# Patient Record
Sex: Male | Born: 1935
Health system: Southern US, Community
[De-identification: ages and names within clinical notes are randomized; demographics above are authoritative.]

## PROBLEM LIST (undated history)

## (undated) DIAGNOSIS — Z860100 Personal history of colon polyps, unspecified: Secondary | ICD-10-CM

## (undated) DIAGNOSIS — M48061 Spinal stenosis, lumbar region without neurogenic claudication: Secondary | ICD-10-CM

## (undated) DIAGNOSIS — L570 Actinic keratosis: Secondary | ICD-10-CM

## (undated) DIAGNOSIS — E785 Hyperlipidemia, unspecified: Secondary | ICD-10-CM

## (undated) DIAGNOSIS — K5730 Diverticulosis of large intestine without perforation or abscess without bleeding: Secondary | ICD-10-CM

## (undated) DIAGNOSIS — I872 Venous insufficiency (chronic) (peripheral): Secondary | ICD-10-CM

## (undated) DIAGNOSIS — F419 Anxiety disorder, unspecified: Secondary | ICD-10-CM

## (undated) DIAGNOSIS — D689 Coagulation defect, unspecified: Secondary | ICD-10-CM

## (undated) DIAGNOSIS — I2699 Other pulmonary embolism without acute cor pulmonale: Principal | ICD-10-CM

## (undated) DIAGNOSIS — H269 Unspecified cataract: Secondary | ICD-10-CM

## (undated) DIAGNOSIS — N183 Chronic kidney disease, stage 3 unspecified: Secondary | ICD-10-CM

## (undated) DIAGNOSIS — I82409 Acute embolism and thrombosis of unspecified deep veins of unspecified lower extremity: Secondary | ICD-10-CM

## (undated) DIAGNOSIS — H3530 Unspecified macular degeneration: Secondary | ICD-10-CM

## (undated) DIAGNOSIS — Z8546 Personal history of malignant neoplasm of prostate: Secondary | ICD-10-CM

## (undated) DIAGNOSIS — M1990 Unspecified osteoarthritis, unspecified site: Secondary | ICD-10-CM

## (undated) DIAGNOSIS — I493 Ventricular premature depolarization: Secondary | ICD-10-CM

## (undated) DIAGNOSIS — C801 Malignant (primary) neoplasm, unspecified: Secondary | ICD-10-CM

## (undated) HISTORY — DX: Venous insufficiency (chronic) (peripheral): I87.2

## (undated) HISTORY — DX: Hyperlipidemia, unspecified: E78.5

## (undated) HISTORY — DX: Personal history of malignant neoplasm of prostate: Z85.46

## (undated) HISTORY — DX: Anxiety disorder, unspecified: F41.9

## (undated) HISTORY — DX: Unspecified osteoarthritis, unspecified site: M19.90

## (undated) HISTORY — DX: Chronic kidney disease, stage 3 (moderate): N18.3

## (undated) HISTORY — PX: VARICOSE VEIN SURGERY: SHX832

## (undated) HISTORY — DX: Coagulation defect, unspecified: D68.9

## (undated) HISTORY — PX: APPENDECTOMY: SHX54

## (undated) HISTORY — DX: Unspecified macular degeneration: H35.30

## (undated) HISTORY — DX: Chronic kidney disease, stage 3 unspecified: N18.30

## (undated) HISTORY — DX: Personal history of colon polyps, unspecified: Z86.0100

## (undated) HISTORY — DX: Personal history of colonic polyps: Z86.010

## (undated) HISTORY — DX: Spinal stenosis, lumbar region without neurogenic claudication: M48.061

## (undated) HISTORY — DX: Diverticulosis of large intestine without perforation or abscess without bleeding: K57.30

## (undated) HISTORY — DX: Unspecified cataract: H26.9

## (undated) HISTORY — DX: Actinic keratosis: L57.0

## (undated) HISTORY — PX: TONSILLECTOMY: SUR1361

## (undated) HISTORY — DX: Other pulmonary embolism without acute cor pulmonale: I26.99

---
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
## 2006-02-21 ENCOUNTER — Encounter: Payer: Self-pay | Admitting: Internal Medicine

## 2008-12-03 DIAGNOSIS — C801 Malignant (primary) neoplasm, unspecified: Secondary | ICD-10-CM

## 2008-12-03 HISTORY — PX: PROSTATECTOMY: SHX69

## 2008-12-03 HISTORY — DX: Malignant (primary) neoplasm, unspecified: C80.1

## 2009-12-03 HISTORY — PX: POLYPECTOMY: SHX149

## 2009-12-03 HISTORY — PX: COLONOSCOPY: SHX174

## 2010-10-24 ENCOUNTER — Ambulatory Visit: Payer: Self-pay | Admitting: Internal Medicine

## 2010-10-24 DIAGNOSIS — Z860100 Personal history of colon polyps, unspecified: Secondary | ICD-10-CM | POA: Insufficient documentation

## 2010-10-24 DIAGNOSIS — E785 Hyperlipidemia, unspecified: Secondary | ICD-10-CM

## 2010-10-24 DIAGNOSIS — Z8546 Personal history of malignant neoplasm of prostate: Secondary | ICD-10-CM

## 2010-10-24 DIAGNOSIS — K5730 Diverticulosis of large intestine without perforation or abscess without bleeding: Secondary | ICD-10-CM | POA: Insufficient documentation

## 2010-12-03 DIAGNOSIS — I82409 Acute embolism and thrombosis of unspecified deep veins of unspecified lower extremity: Secondary | ICD-10-CM

## 2010-12-03 HISTORY — DX: Acute embolism and thrombosis of unspecified deep veins of unspecified lower extremity: I82.409

## 2010-12-25 ENCOUNTER — Telehealth: Payer: Self-pay | Admitting: Internal Medicine

## 2011-01-02 NOTE — Assessment & Plan Note (Signed)
Summary: NEW PT TO [facility]   Vital Signs:  Patient profile:   77 year old male Height:      68 inches Weight:      178 pounds BMI:     27.16 Temp:     98.2 degrees F oral Pulse rate:   60 / minute Pulse rhythm:   regular BP sitting:   112 / 60  (left arm) Cuff size:   large  Vitals Entered By: Mervin Hack CMA Duncan Dull) (October 24, 2010 11:20 AM) CC: new patient to establish care   History of Present Illness: Moved to [facility] [facility] this month Transferring care here  Has history of high cholesterol On meds for many years Did have brief leg pain---no problems since switch to this med  Glaucoma and macular degeneration Basically blind in right eye still able to drive Established with Dr Inez Pilgrim and will see their retinal specialist  Has history of skin cancer--no melanoma  Prostate cancer---had robotic prostatectomy Did see urologist in September PSA undetectable then  history of colonic polyps recall 3 years  Had hernia repair some years ago with mesh Now noting some left groin pain worse yesterday with lifting something  Diagnosed with spinal stenosis did get injections which helped some Has to be careful walking down steps ---has sense of being ready to fall Does okay regular walking up steps or on level ground Helps to take ibuprofen--like before playing golf  Preventive Screening-Counseling & Management  Alcohol-Tobacco     Smoking Status: quit  Allergies (verified): No Known Drug Allergies  Past History:  Past Medical History: Prostate cancer, hx of Colonic polyps, hx of Diverticulosis, colon Hyperlipidemia Glaucoma Macular degeneration--legally blind in right eye Lumbar spinal stenosis Osteoarthritis  Past Surgical History: Appendectomy Inguinal herniorrhaphy--left Prostatectomy Tonsillectomy Varicose veins stripping --left   Family History: Dad died @99   Mild stroke when young Mom died @84  Dementia 1 sister  died of leukemia @71  NO CAD, DM, HTN No colon or prostate cancer  Social History: 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 WIfe is his health care power of attorney. Discussed DNR--he requests this  No feeding tube Smoking Status:  quit  Review of Systems General:  weight has been stable sleeps fairly well wears seat belt. Eyes:  Complains of vision loss-1 eye; denies double vision. ENT:  Complains of decreased hearing; denies ringing in ears; ?mild hearing loss Own teeth with crowns---regular with dentist. CV:  Denies chest pain or discomfort, difficulty breathing at night, difficulty breathing while lying down, fainting, lightheadness, palpitations, and shortness of breath with exertion. Resp:  Denies cough and shortness of breath. GI:  Denies abdominal pain, bloody stools, change in bowel habits, dark tarry stools, indigestion, nausea, and vomiting. GU:  Complains of erectile dysfunction; denies incontinence, urinary frequency, and urinary hesitancy; no sexual function since surgery. MS:  Complains of joint pain and low back pain; denies joint swelling; scattered joint pains only occ back pain. Derm:  Denies lesion(s) and rash. Neuro:  Complains of headaches; denies numbness, tingling, and weakness; rare headaches. Psych:  Denies anxiety and depression. Heme:  Denies abnormal bruising and enlarge lymph nodes. Allergy:  Denies seasonal allergies and sneezing; rarely tries some claritin if congested.  Physical Exam  General:  alert and normal appearance.   Eyes:  pupils equal, pupils round, and pupils reactive to light.   Mouth:  no erythema, no exudates, and no  lesions.   Neck:  supple, no masses, no thyromegaly, no carotid bruits, and no cervical lymphadenopathy.   Lungs:  normal respiratory effort, no intercostal retractions, no accessory muscle use, and normal breath  sounds.   Heart:  normal rate, regular rhythm, no murmur, and no gallop.   Abdomen:  soft and non-tender.   Small weakness along left inguinal ring Msk:  no joint tenderness and no joint swelling.   Pulses:  1+ in feet Extremities:  no edema Neurologic:  alert & oriented X3, strength normal in all extremities, and gait normal.   Skin:  no rashes and no suspicious lesions.   Axillary Nodes:  No palpable lymphadenopathy Psych:  normally interactive, good eye contact, not anxious appearing, and not depressed appearing.     Impression & Recommendations:  Problem # 1:  SPINAL STENOSIS, LUMBAR (ICD-724.02) Assessment Comment Only probably leads to his balance issues pain has been fine since the shots  Problem # 2:  HYPERLIPIDEMIA (ICD-272.4) Assessment: Comment Only discussed my feelings about primary prevention he will continue for now  His updated medication list for this problem includes:    Pravastatin Sodium 20 Mg Tabs (Pravastatin sodium) .Marland Kitchen... Take 1 by mouth once daily  Problem # 3:  PROSTATE CANCER, HX OF (ICD-V10.46) Assessment: Comment Only We will check PSAs here--starting next visit  Problem # 4:  OSTEOARTHRITIS (ICD-715.90) Assessment: Comment Only does okay with occ ibuprofen  Complete Medication List: 1)  Pravastatin Sodium 20 Mg Tabs (Pravastatin sodium) .... Take 1 by mouth once daily 2)  Alphagan P 0.1 % Soln (Brimonidine tartrate) .... 2 drops daily 3)  Fish Oil 1000 Mg Caps (Omega-3 fatty acids) .... Once daily 4)  Aspirin 81 Mg Tabs (Aspirin) .... Take 1 by mouth once daily 5)  Vision Vitamins Tabs (Multiple vitamins-minerals) .... Once daily 6)  Acular Ls 0.4 % Soln (Ketorolac tromethamine) .... Once daily  Other Orders: TD Toxoids IM 7 YR + (82956) Admin 1st Vaccine (21308)  Patient Instructions: 1)  Please schedule a follow-up appointment in 6 months .    Orders Added: 1)  New Patient Level IV [99204] 2)  TD Toxoids IM 7 YR + [90714] 3)  Admin  1st Vaccine [90471]   Immunization History:  Influenza Immunization History:    Influenza:  historical (10/03/2010)  Pneumovax Immunization History:    Pneumovax:  historical (10/30/2005)  Zostavax History:    Zostavax # 1:  zostavax (10/03/2010)  Immunizations Administered:  Tetanus Vaccine:    Vaccine Type: Td    Site: left deltoid    Mfr: Sanofi Pasteur    Dose: 0.5 ml    Route: IM    Given by: Mervin Hack CMA (AAMA)    Exp. Date: 01/04/2012    Lot #: [id number]    VIS given: 10/20/08 version given October 24, 2010.   Immunization History:  Influenza Immunization History:    Influenza:  Historical (10/03/2010)  Pneumovax Immunization History:    Pneumovax:  Historical (10/30/2005)  Zostavax History:    Zostavax # 1:  Zostavax (10/03/2010)  Immunizations Administered:  Tetanus Vaccine:    Vaccine Type: Td    Site: left deltoid    Mfr: Sanofi Pasteur    Dose: 0.5 ml    Route: IM    Given by: Mervin Hack CMA (AAMA)    Exp. Date: 01/04/2012    Lot #: [id number]    VIS given: 10/20/08 version given October 24, 2010.  Current Allergies (reviewed today): No known allergies  Colonoscopy  Procedure date:  06/30/2010  Findings:      recurrent adenomatous polyps 3 year recall

## 2011-01-02 NOTE — Letter (Signed)
[facility]  [facility] Records   Imported By: Beau Fanny 10/24/2010 14:28:43  _____________________________________________________________________  External Attachment:    Type:   Image     Comment:   External Document

## 2011-01-02 NOTE — Letter (Signed)
Summary: Nature conservation officer [employer] Wellness Visit Questionnaire   [employer] Annual Wellness Visit Questionnaire   Imported By: Beau Fanny 10/24/2010 14:41:02  _____________________________________________________________________  External Attachment:    Type:   Image     Comment:   External Document

## 2011-01-02 NOTE — Miscellaneous (Signed)
Summary: Do Not Resuscitate Order  Do Not Resuscitate Order   Imported By: Beau Fanny 10/24/2010 14:40:11  _____________________________________________________________________  External Attachment:    Type:   Image     Comment:   External Document

## 2011-01-04 NOTE — Progress Notes (Signed)
Summary: pravastatin  Phone Note Refill Request Message from:  Patient on December 25, 2010 10:47 AM  Refills Requested: Medication #1:  PRAVASTATIN SODIUM 20 MG TABS take 1 by mouth once daily Patient needs written script for mail order.    Method Requested: Pick up at Office Initial call taken by: Melody Comas,  December 25, 2010 10:47 AM  Follow-up for Phone Call        Rx written Follow-up by: Cindee Salt MD,  December 25, 2010 1:37 PM  Additional Follow-up for Phone Call Additional follow up Details #1::        Spoke with patient and advised rx ready for pick-up  Additional Follow-up by: Mervin Hack CMA Duncan Dull),  December 25, 2010 2:21 PM    New/Updated Medications: PRAVASTATIN SODIUM 20 MG TABS (PRAVASTATIN SODIUM) take 1 by mouth once daily Prescriptions: PRAVASTATIN SODIUM 20 MG TABS (PRAVASTATIN SODIUM) take 1 by mouth once daily  #90 x 3   Entered and Authorized by:   Cindee Salt MD   Signed by:   Cindee Salt MD on 12/25/2010   Method used:   Print then Give to Patient   RxID:   [id number]

## 2011-02-12 ENCOUNTER — Encounter: Payer: Self-pay | Admitting: Internal Medicine

## 2011-02-12 DIAGNOSIS — M1990 Unspecified osteoarthritis, unspecified site: Secondary | ICD-10-CM

## 2011-02-12 DIAGNOSIS — H353220 Exudative age-related macular degeneration, left eye, stage unspecified: Secondary | ICD-10-CM | POA: Insufficient documentation

## 2011-02-12 DIAGNOSIS — H409 Unspecified glaucoma: Secondary | ICD-10-CM | POA: Insufficient documentation

## 2011-02-12 DIAGNOSIS — Z8546 Personal history of malignant neoplasm of prostate: Secondary | ICD-10-CM

## 2011-02-12 DIAGNOSIS — K5730 Diverticulosis of large intestine without perforation or abscess without bleeding: Secondary | ICD-10-CM

## 2011-02-12 DIAGNOSIS — Z860100 Personal history of colon polyps, unspecified: Secondary | ICD-10-CM

## 2011-02-12 DIAGNOSIS — E785 Hyperlipidemia, unspecified: Secondary | ICD-10-CM

## 2011-02-12 DIAGNOSIS — M48061 Spinal stenosis, lumbar region without neurogenic claudication: Secondary | ICD-10-CM

## 2011-02-12 DIAGNOSIS — H3530 Unspecified macular degeneration: Secondary | ICD-10-CM

## 2011-02-28 ENCOUNTER — Encounter: Payer: Self-pay | Admitting: Family Medicine

## 2011-02-28 ENCOUNTER — Non-Acute Institutional Stay: Payer: Medicare Other | Admitting: Family Medicine

## 2011-02-28 VITALS — HR 66 | Temp 98.7°F | Resp 18

## 2011-02-28 DIAGNOSIS — J069 Acute upper respiratory infection, unspecified: Secondary | ICD-10-CM

## 2011-02-28 MED ORDER — AMOXICILLIN 500 MG PO CAPS: 500.0000 mg | ORAL_CAPSULE | Freq: Two times a day (BID) | ORAL | Status: AC

## 2011-02-28 MED ORDER — AMOXICILLIN 500 MG PO CAPS: 500.0000 mg | ORAL_CAPSULE | Freq: Two times a day (BID) | ORAL | Status: DC

## 2011-02-28 NOTE — Progress Notes (Addendum)
  Subjective:    Patient ID: Juan Horn, male    DOB: [birth year], 77 y.o.   MRN: [id number]  HPI duration of symptoms: 2 weeks, getting progressively worse.  Just returned from 2 week cruise Rhinorrhea: yes congestion:yes ear pain: yes sore throat:yes Cough:productive of clear sputum myalgias:no other concerns: no CP, NO SOB.    Meds, vitals, and allergies reviewed.   Review of Systems  ROS: See HPI.  Otherwise negative.     Objective:   Physical Exam     GEN: nad, alert and oriented HEENT: mucous membranes moist, TM w/o erythema, nasal epithelium injected, OP with cobblestoning NECK: supple w/o LA CV: rrr. PULM: ctab, no inc wob ABD: soft, +bs EXT: no edema   Assessment & Plan:

## 2011-02-28 NOTE — Assessment & Plan Note (Signed)
Give duration and progression of symptoms along with recent travel, will treat for bacterial process with amoxicillin. See pt instructions for details.

## 2011-02-28 NOTE — Progress Notes (Signed)
Addended by: Ruthe Mannan on: 02/28/2011 01:40 PM   Modules accepted: Orders, Level of Service

## 2011-02-28 NOTE — Progress Notes (Signed)
Addended by: Ruthe Mannan on: 02/28/2011 03:00 PM   Modules accepted: Orders

## 2011-02-28 NOTE — Patient Instructions (Signed)
Take antibiotic as directed.  Drink lots of fluids.  Treat sympotmatically with Mucinex, nasal saline irrigation, and Tylenol/Ibuprofen. Also try claritin D or zyrtec D over the counter- two times a day as needed ( have to sign for them at pharmacy). You can use warm compresses.  Cough suppressant at night. Call if not improving as expected in 5-7 days.    

## 2011-03-01 ENCOUNTER — Telehealth: Payer: Self-pay | Admitting: *Deleted

## 2011-03-01 NOTE — Telephone Encounter (Signed)
I'm confused.  I sent in his amoxicillin yesterday.  I never said anything about a cough medicine.  Please verify what the pharmacy has received the amoxicillin rx. If he would like cough suppressant, ok to send in tessalon perles- 100 mg- 1 tab three times daily as needed for cough, number 30 with no refills.

## 2011-03-01 NOTE — Telephone Encounter (Signed)
Pt was seen yesterday at [facility] and was told that 2 meds would be called in for him to [facility]. The pharmacy also called and said they dont have a record of these meds being sent in and that the pt called them 5 times yesterday looking for his cough medicine.  Please send in.

## 2011-03-01 NOTE — Telephone Encounter (Signed)
See my note

## 2011-03-01 NOTE — Telephone Encounter (Signed)
Pharmacy stated that Rx was never sent to pharmacy.  Patient was upset because Rx was not at the pharmacy.  Called in Rx for Amoxicillin and Tessalon Perles.

## 2011-03-23 ENCOUNTER — Ambulatory Visit (INDEPENDENT_AMBULATORY_CARE_PROVIDER_SITE_OTHER): Payer: Medicare Other | Admitting: Internal Medicine

## 2011-03-23 ENCOUNTER — Encounter: Payer: Self-pay | Admitting: Internal Medicine

## 2011-03-23 VITALS — BP 112/60 | HR 54 | Temp 98.5°F | Ht 68.0 in | Wt 185.0 lb

## 2011-03-23 DIAGNOSIS — M1710 Unilateral primary osteoarthritis, unspecified knee: Secondary | ICD-10-CM

## 2011-03-23 DIAGNOSIS — Z8546 Personal history of malignant neoplasm of prostate: Secondary | ICD-10-CM

## 2011-03-23 DIAGNOSIS — M48061 Spinal stenosis, lumbar region without neurogenic claudication: Secondary | ICD-10-CM

## 2011-03-23 DIAGNOSIS — C61 Malignant neoplasm of prostate: Secondary | ICD-10-CM | POA: Insufficient documentation

## 2011-03-23 DIAGNOSIS — E785 Hyperlipidemia, unspecified: Secondary | ICD-10-CM

## 2011-03-23 MED ORDER — TRIAMCINOLONE ACETONIDE 40 MG/ML IJ SUSP: 40.0000 mg | Freq: Once | INTRAMUSCULAR | Status: DC

## 2011-03-23 NOTE — Progress Notes (Signed)
Subjective:    Patient ID: Juan Horn, male    DOB: Jul 14, 1933, 77 y.o.   MRN: [id number]  HPI Having knee and back pain Had been told his knees were bad even 6 years ago Then they concentrated on the spinal stenosis Did just have last shot there in March  Pain is mostly in left knee now Hard to walk--pain comes on just walking short distances Had to give up golf Ibuprofen 800mg  helps some Esp hard up or down hills Some tightness behind left knee but no sig swelling No sig right leg symptoms Can go up steps--holds the railing. Going down steps "is pure hell" This is the same pain  Cortisone injections in the distant past---doesn't remember if they helped  Still gets PSAs every 6 months No urinary problems  Still on chol med  Current outpatient prescriptions:aspirin 81 MG tablet, Take 81 mg by mouth daily.  , Disp: , Rfl: ;  fish oil-omega-3 fatty acids 1000 MG capsule, Take 1 g by mouth daily.  , Disp: , Rfl: ;  Multiple Vitamin (MULTIVITAMIN) tablet, Take 1 tablet by mouth daily.  , Disp: , Rfl: ;  pravastatin (PRAVACHOL) 20 MG tablet, Take 20 mg by mouth daily.  , Disp: , Rfl: ;  DISCONTD: brimonidine (ALPHAGAN P) 0.1 % SOLN, 2 drops daily.  , Disp: , Rfl:  DISCONTD: ketorolac (ACULAR) 0.4 % SOLN, 1 drop daily.  , Disp: , Rfl:   Past Medical History  Diagnosis Date  . Personal history of prostate cancer   . Hx of colonic polyp   . Diverticulosis of colon   . HLD (hyperlipidemia)   . Glaucoma   . Macular degeneration     legally blind in right eye  . Spinal stenosis of lumbar region   . OA (osteoarthritis)     Past Surgical History  Procedure Date  . Appendectomy   . Inguinal hernia repair     left  . Prostatectomy   . Tonsillectomy   . Varicose vein surgery     left    Family History  Problem Relation Age of Onset  . Stroke Father   . Dementia Mother   . Leukemia Sister     History   Social History  . Marital Status: Married    Spouse Name: N/A      Number of Children: 0  . Years of Education: N/A   Occupational History  . 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 Smoking status: Former Smoker    Quit date: 12/03/1978  . Smokeless tobacco: Not on file  . Alcohol Use: Yes     Wine-several times per week  . Drug Use: Not on file  . Sexually Active: Not on file   Other Topics Concern  . Not on file   Social History Narrative   Has living will DNR done 11/12Wife is health care POA.No feeding tube   Review of Systems Goes to fitness center and does some LE weights No leg weakness of note No change in bowel or bladder habits    Objective:   Physical Exam  Constitutional: He is oriented to person, place, and time. He appears well-developed and well-nourished. No distress.  Neck: Normal range of motion. Neck supple.  Musculoskeletal: He exhibits no edema and no tenderness.       Crepitus in left knee with ROM Macmurray's negative No ligament instability Passive ROM fairly normal  Neurological: He is alert and oriented  to person, place, and time. He exhibits normal muscle tone.       No focal weakness  Psychiatric: He has a normal mood and affect. His behavior is normal. Judgment and thought content normal.          Assessment & Plan:

## 2011-03-23 NOTE — Progress Notes (Signed)
Addended by: Melody Comas on: 03/23/2011 03:30 PM   Modules accepted: Orders

## 2011-03-23 NOTE — Patient Instructions (Signed)
Call for orthopedic referral if your knee isn't better soon

## 2011-03-24 LAB — PSA: PSA: <0.01 ng/mL (ref <=4.00)

## 2011-04-03 HISTORY — PX: INGUINAL HERNIA REPAIR: SUR1180

## 2011-04-06 ENCOUNTER — Ambulatory Visit (INDEPENDENT_AMBULATORY_CARE_PROVIDER_SITE_OTHER): Payer: Medicare Other | Admitting: Internal Medicine

## 2011-04-06 ENCOUNTER — Encounter: Payer: Self-pay | Admitting: Internal Medicine

## 2011-04-06 VITALS — BP 124/94 | HR 72 | Temp 98.7°F | Ht 67.0 in | Wt 180.4 lb

## 2011-04-06 DIAGNOSIS — K4090 Unilateral inguinal hernia, without obstruction or gangrene, not specified as recurrent: Secondary | ICD-10-CM

## 2011-04-06 NOTE — Patient Instructions (Signed)
Please set up the appointment with the surgeon

## 2011-04-06 NOTE — Progress Notes (Signed)
  Subjective:    Patient ID: Juan Horn, male    DOB: 01/14/1933, 77 y.o.   MRN: [id number]  HPI Shot in knee really helped pain  Has had intermittent stomach problems Notes swelling in left lower abdomen Particularly bad after golf 2 days ago Points to left inguinal area  No trouble with bowels No urinary trouble No scrotal swelling  Had [facility] laparascopically 10 years or more ago. Mesh used Prostate surgery ~2 years ago Marked trouble due to adhesions?, mesh apparently left in  Current outpatient prescriptions:aspirin 81 MG tablet, Take 81 mg by mouth daily.  , Disp: , Rfl: ;  fish oil-omega-3 fatty acids 1000 MG capsule, Take 1 g by mouth daily.  , Disp: , Rfl: ;  glucosamine-chondroitin 500-400 MG tablet, Take 1 tablet by mouth daily.  , Disp: , Rfl: ;  Multiple Vitamin (MULTIVITAMIN) tablet, Take 1 tablet by mouth daily.  , Disp: , Rfl: ;  pravastatin (PRAVACHOL) 20 MG tablet, Take 20 mg by mouth daily.  , Disp: , Rfl:  Current facility-administered medications:triamcinolone acetonide (KENALOG-40) injection 40 mg, 40 mg, Intra-articular, Once, Varney Baas, MD  Past Medical History  Diagnosis Date  . Personal history of prostate cancer   . Hx of colonic polyp   . Diverticulosis of colon   . HLD (hyperlipidemia)   . Glaucoma   . Macular degeneration     legally blind in right eye  . Spinal stenosis of lumbar region   . OA (osteoarthritis)     Past Surgical History  Procedure Date  . Appendectomy   . Inguinal hernia repair     left  . Prostatectomy   . Tonsillectomy   . Varicose vein surgery     left    Family History  Problem Relation Age of Onset  . Stroke Father   . Dementia Mother   . Leukemia Sister     History   Social History  . Marital Status: Married    Spouse Name: N/A    Number of Children: 0  . Years of Education: N/A   Occupational History  . 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 Smoking status:  Former Smoker    Quit date: 12/03/1978  . Smokeless tobacco: Not on file  . Alcohol Use: Yes     Wine-several times per week  . Drug Use: Not on file  . Sexually Active: Not on file   Other Topics Concern  . Not on file   Social History Narrative   Has living will DNR done 11/12Wife is health care POA.No feeding tube   Review of Systems No fever Doesn't feel sick     Objective:   Physical Exam  Constitutional: He appears well-developed and well-nourished. No distress.  Abdominal: Soft. He exhibits no distension. There is no tenderness. There is no guarding.  Genitourinary:       Scrotum quiet Does have palpable bowel in left groin Reduces when supine Not clearly down canal on exam          Assessment & Plan:

## 2011-04-10 ENCOUNTER — Ambulatory Visit: Payer: Self-pay | Admitting: Internal Medicine

## 2011-04-25 ENCOUNTER — Ambulatory Visit: Payer: Self-pay | Admitting: Anesthesiology

## 2011-04-26 ENCOUNTER — Ambulatory Visit: Payer: Self-pay | Admitting: General Surgery

## 2011-05-08 ENCOUNTER — Encounter: Payer: Self-pay | Admitting: Internal Medicine

## 2011-05-28 ENCOUNTER — Ambulatory Visit (INDEPENDENT_AMBULATORY_CARE_PROVIDER_SITE_OTHER): Payer: Medicare Other | Admitting: Internal Medicine

## 2011-05-28 ENCOUNTER — Encounter: Payer: Self-pay | Admitting: Internal Medicine

## 2011-05-28 VITALS — BP 120/60 | HR 64 | Temp 98.0°F | Ht 68.0 in | Wt 181.0 lb

## 2011-05-28 DIAGNOSIS — F419 Anxiety disorder, unspecified: Secondary | ICD-10-CM

## 2011-05-28 DIAGNOSIS — F411 Generalized anxiety disorder: Secondary | ICD-10-CM

## 2011-05-28 MED ORDER — CITALOPRAM HYDROBROMIDE 10 MG PO TABS: 10.0000 mg | ORAL_TABLET | Freq: Every day | ORAL | Status: DC

## 2011-05-28 MED ORDER — ALPRAZOLAM 0.25 MG PO TABS: 0.2500 mg | ORAL_TABLET | Freq: Three times a day (TID) | ORAL | Status: AC | PRN

## 2011-05-28 NOTE — Assessment & Plan Note (Signed)
History of significant issue 40 years ago and some mild anxiety at times Move here 7 months ago went very well but now in conflict with another resident and that has thrown him for a loop Daily anxiety symptoms for the past 3 weeks or so  Discussed at length counselled over half of 25 minute visit Will start citalopram at low dose Alprazolam for prn use

## 2011-05-28 NOTE — Progress Notes (Signed)
Subjective:    Patient ID: Juan Horn, male    DOB: 03/06/1933, 77 y.o.   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 help Clashes with another member in club---very negative (and the meetings were at patient's house) Sent e-mail "are we going to have a show down?"  "I feel like I am on his train and I have no control and can't get off" Has had anxiety issues back in 1970's Started new job at that time---corruption there and tough situation Had to leave work for a week then  Has been awakening stress out in AM and in middle of night No palpitations or chest pain Stomach feels tight  Wife notes it has been "one thing or another" Anxiety has been building for some time Symptoms have been persistent for some weeks--awakens stressed every morning  Allergies  Allergen Reactions  . Eye Drops Other (See Comments)    (Afgan) redness   Current Outpatient Prescriptions on File Prior to Visit  Medication Sig Dispense Refill  . aspirin 81 MG tablet Take 81 mg by mouth daily.        . fish oil-omega-3 fatty acids 1000 MG capsule Take 1 g by mouth daily.        Marland Kitchen glucosamine-chondroitin 500-400 MG tablet Take 1 tablet by mouth daily.        . Multiple Vitamin (MULTIVITAMIN) tablet Take 1 tablet by mouth daily.        . pravastatin (PRAVACHOL) 20 MG tablet Take 20 mg by mouth daily.         Current Facility-Administered Medications on File Prior to Visit  Medication Dose Route Frequency Provider Last Rate Last Dose  . DISCONTD: triamcinolone acetonide (KENALOG-40) injection 40 mg  40 mg Intra-articular Once Varney Baas, MD       Past Medical History  Diagnosis Date  . Personal history of prostate cancer   . Hx of colonic polyp   . Diverticulosis of colon   . HLD (hyperlipidemia)   . Glaucoma   . Macular degeneration     legally  blind in right eye  . Spinal stenosis of lumbar region   . OA (osteoarthritis)     Past Surgical History  Procedure Date  . Appendectomy   . Inguinal hernia repair     left  . Prostatectomy   . Tonsillectomy   . Varicose vein surgery     left  . Inguinal hernia repair 5/12    Dr Beverly Sessions  . Inguinal hernia repair 5/12    Dr Evette Cristal did redo of this    Family History  Problem Relation Age of Onset  . Stroke Father   . Dementia Mother   . Leukemia Sister     History   Social History  . Marital Status: Married    Spouse Name: N/A    Number of Children: 0  . Years of Education: N/A   Occupational History  . 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 Smoking status: Former Smoker    Quit date: 12/03/1978  . Smokeless tobacco: Not on file  . Alcohol Use: Yes     Wine-several times per week  . Drug Use: Not on file  . Sexually Active: Not on file   Other Topics Concern  . Not on file   Social  History Narrative   Has living will DNR done 11/12Wife is health care POA.No feeding tube   Review of Systems Appetite is okay Not persistently depressed but he is frustrated--wife feels he has persistent depressed mood for 2-3 weeks     Objective:   Physical Exam  Constitutional: He appears well-developed and well-nourished. No distress.  Psychiatric:       Mildly anxious Tearful discussed conflict with other man          Assessment & Plan:

## 2011-06-18 ENCOUNTER — Encounter: Payer: Self-pay | Admitting: Internal Medicine

## 2011-06-18 ENCOUNTER — Ambulatory Visit (INDEPENDENT_AMBULATORY_CARE_PROVIDER_SITE_OTHER): Payer: Medicare Other | Admitting: Internal Medicine

## 2011-06-18 VITALS — BP 112/60 | HR 63 | Temp 98.4°F | Ht 68.0 in | Wt 177.0 lb

## 2011-06-18 DIAGNOSIS — F411 Generalized anxiety disorder: Secondary | ICD-10-CM

## 2011-06-18 DIAGNOSIS — F419 Anxiety disorder, unspecified: Secondary | ICD-10-CM

## 2011-06-18 NOTE — Assessment & Plan Note (Signed)
Is better on the citalopram Discussed ongoing use--may reconsider in 9-12 months Has xanax for prn

## 2011-06-18 NOTE — Progress Notes (Signed)
Subjective:    Patient ID: Juan Horn, male    DOB: 1933/08/31, 77 y.o.   MRN: [id number]  HPI Here with wife again Feels "better" Ups and downs but clearly better Still some trouble sleeping---initiates okay and then trouble getting back to sleep Has had to use the alprazolam 6 times---generally in Am if "wound like a drum"  Has set up a plan to remedy some of the problems He is relinquishing head of photography club  Current Outpatient Prescriptions on File Prior to Visit  Medication Sig Dispense Refill  . ALPRAZolam (XANAX) 0.25 MG tablet Take 1 tablet (0.25 mg total) by mouth 3 (three) times daily as needed for sleep or anxiety.  30 tablet  0  . aspirin 81 MG tablet Take 81 mg by mouth daily.        . citalopram (CELEXA) 10 MG tablet Take 1 tablet (10 mg total) by mouth daily.  30 tablet  11  . fish oil-omega-3 fatty acids 1000 MG capsule Take 1 g by mouth daily.        Marland Kitchen glucosamine-chondroitin 500-400 MG tablet Take 1 tablet by mouth daily.        . Multiple Vitamin (MULTIVITAMIN) tablet Take 1 tablet by mouth daily.        . pravastatin (PRAVACHOL) 20 MG tablet Take 20 mg by mouth daily.          Allergies  Allergen Reactions  . Eye Drops Other (See Comments)    (Afgan) redness    Past Medical History  Diagnosis Date  . Personal history of prostate cancer   . Hx of colonic polyp   . Diverticulosis of colon   . HLD (hyperlipidemia)   . Glaucoma   . Macular degeneration     legally blind in right eye  . Spinal stenosis of lumbar region   . OA (osteoarthritis)   . Anxiety     Past Surgical History  Procedure Date  . Appendectomy   . Inguinal hernia repair     left  . Prostatectomy   . Tonsillectomy   . Varicose vein surgery     left  . Inguinal hernia repair 5/12    Dr Beverly Sessions  . Inguinal hernia repair 5/12    Dr Evette Cristal did redo of this    Family History  Problem Relation Age of Onset  . Stroke Father   . Dementia Mother   . Leukemia  Sister     History   Social History  . Marital Status: Married    Spouse Name: N/A    Number of Children: 0  . Years of Education: N/A   Occupational History  . 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 Smoking status: Former Smoker    Quit date: 12/03/1978  . Smokeless tobacco: Not on file  . Alcohol Use: Yes     Wine-several times per week  . Drug Use: Not on file  . Sexually Active: Not on file   Other Topics Concern  . Not on file   Social History Narrative   Has living will DNR done 11/12Wife is health care POA.No feeding tube   Review of Systems Appetite is poor in the morning---eats very light Does okay after that Still enjoys regular fitness work at center Weight is down slightly    Objective:   Physical Exam  Psychiatric: He has a normal mood and affect. His behavior is normal. Judgment and thought content normal.  Assessment & Plan:

## 2011-06-18 NOTE — Patient Instructions (Signed)
If you find your anxiety is worsening again, please call. We will need to increase the citalopram. Please do not stop the citalopram unless we have spoken about it Please keep the October appt

## 2011-06-21 ENCOUNTER — Ambulatory Visit: Payer: Medicare Other | Admitting: Internal Medicine

## 2011-06-22 ENCOUNTER — Ambulatory Visit: Payer: Medicare Other | Admitting: Internal Medicine

## 2011-06-22 ENCOUNTER — Other Ambulatory Visit: Payer: Self-pay | Admitting: *Deleted

## 2011-06-22 MED ORDER — CITALOPRAM HYDROBROMIDE 10 MG PO TABS: 10.0000 mg | ORAL_TABLET | Freq: Every day | ORAL | Status: DC

## 2011-06-22 NOTE — Telephone Encounter (Signed)
Spoke with patient and advised results   

## 2011-06-22 NOTE — Telephone Encounter (Signed)
Patient is asking for a written script to pick up so he can mail it to [facility]. He would like to pick this up some time next week.

## 2011-06-25 ENCOUNTER — Telehealth: Payer: Self-pay | Admitting: *Deleted

## 2011-06-25 NOTE — Telephone Encounter (Signed)
Left message on machine that DNR is ready for pick-up, and it will be at our front desk.  

## 2011-06-25 NOTE — Telephone Encounter (Signed)
Patient came in asking for a DNR, please advise.

## 2011-06-25 NOTE — Telephone Encounter (Signed)
Order was already done Will redo the yellow form

## 2011-09-07 ENCOUNTER — Ambulatory Visit: Payer: Medicare Other | Admitting: Internal Medicine

## 2011-09-17 ENCOUNTER — Encounter: Payer: Self-pay | Admitting: Internal Medicine

## 2011-09-17 ENCOUNTER — Ambulatory Visit (INDEPENDENT_AMBULATORY_CARE_PROVIDER_SITE_OTHER): Payer: Medicare Other | Admitting: Internal Medicine

## 2011-09-17 DIAGNOSIS — F411 Generalized anxiety disorder: Secondary | ICD-10-CM

## 2011-09-17 DIAGNOSIS — IMO0002 Reserved for concepts with insufficient information to code with codable children: Secondary | ICD-10-CM

## 2011-09-17 DIAGNOSIS — F419 Anxiety disorder, unspecified: Secondary | ICD-10-CM

## 2011-09-17 DIAGNOSIS — M1710 Unilateral primary osteoarthritis, unspecified knee: Secondary | ICD-10-CM

## 2011-09-17 DIAGNOSIS — M48061 Spinal stenosis, lumbar region without neurogenic claudication: Secondary | ICD-10-CM

## 2011-09-17 DIAGNOSIS — Z23 Encounter for immunization: Secondary | ICD-10-CM

## 2011-09-17 DIAGNOSIS — C61 Malignant neoplasm of prostate: Secondary | ICD-10-CM

## 2011-09-17 DIAGNOSIS — E785 Hyperlipidemia, unspecified: Secondary | ICD-10-CM

## 2011-09-17 LAB — TSH: TSH: 1.2 u[IU]/mL (ref 0.35–5.50)

## 2011-09-17 LAB — CBC WITH DIFFERENTIAL/PLATELET
Basophils Relative: 0.4 % (ref 0.0–3.0)
Eosinophils Absolute: 0.1 10*3/uL (ref 0.0–0.7)
Eosinophils Relative: 1.3 % (ref 0.0–5.0)
HCT: 43.7 % (ref 39.0–52.0)
Lymphs Abs: 2 10*3/uL (ref 0.7–4.0)
MCHC: 33.7 g/dL (ref 30.0–36.0)
MCV: 95.1 fl (ref 78.0–100.0)
Monocytes Absolute: 0.7 10*3/uL (ref 0.1–1.0)
Neutrophils Relative %: 60 % (ref 43.0–77.0)
Platelets: 146 10*3/uL — ABNORMAL LOW (ref 150.0–400.0)
RBC: 4.6 Mil/uL (ref 4.22–5.81)

## 2011-09-17 LAB — LDL CHOLESTEROL, DIRECT: Direct LDL: 128.1 mg/dL

## 2011-09-17 LAB — LIPID PANEL
Cholesterol: 207 mg/dL — ABNORMAL HIGH (ref 0–200)
Triglycerides: 146 mg/dL (ref 0.0–149.0)

## 2011-09-17 LAB — HEPATIC FUNCTION PANEL
Albumin: 3.8 g/dL (ref 3.5–5.2)
Total Bilirubin: 1.1 mg/dL (ref 0.3–1.2)

## 2011-09-17 LAB — BASIC METABOLIC PANEL
CO2: 31 mEq/L (ref 19–32)
Calcium: 9.3 mg/dL (ref 8.4–10.5)
Chloride: 106 mEq/L (ref 96–112)
Creatinine, Ser: 1.2 mg/dL (ref 0.4–1.5)
Glucose, Bld: 90 mg/dL (ref 70–99)

## 2011-09-17 MED ORDER — TRIAMCINOLONE ACETONIDE 40 MG/ML IJ SUSP: 40.0000 mg | Freq: Once | INTRAMUSCULAR | Status: DC

## 2011-09-17 NOTE — Progress Notes (Signed)
Subjective:    Patient ID: Juan Horn, male    DOB: 10/04/1933, 77 y.o.   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 aspirin 81 MG tablet Take 81 mg by mouth daily.        . citalopram (CELEXA) 10 MG tablet Take 1 tablet (10 mg total) by mouth daily.  90 tablet  3  . fish oil-omega-3 fatty acids 1000 MG capsule Take 1 g by mouth daily.        Marland Kitchen glucosamine-chondroitin 500-400 MG tablet Take 1 tablet by mouth daily.        . Multiple Vitamin (MULTIVITAMIN) tablet Take 1 tablet by mouth daily.        . pravastatin (PRAVACHOL) 20 MG tablet Take 20 mg by mouth daily.          Allergies  Allergen Reactions  . Eye Drops Other (See Comments)    (Afgan) redness    Past Medical History  Diagnosis Date  . Personal history of prostate cancer   . Hx of colonic polyp   . Diverticulosis of colon   . HLD (hyperlipidemia)   . Glaucoma   . Macular degeneration     legally blind in right eye  . Spinal stenosis of lumbar region   . OA (osteoarthritis)   . Anxiety     Past Surgical History  Procedure Date  . Appendectomy   . Inguinal hernia repair     left  . Prostatectomy   . Tonsillectomy   . Varicose vein surgery     left  . Inguinal hernia repair 5/12    Dr  Beverly Sessions  . Inguinal hernia repair 5/12    Dr Evette Cristal did redo of this    Family History  Problem Relation Age of Onset  . Stroke Father   . Dementia Mother   . Leukemia Sister     History   Social History  . Marital Status: Married    Spouse Name: N/A    Number of Children: 0  . Years of Education: N/A   Occupational History  . 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 Smoking status: Former Smoker    Quit date: 12/03/1978  . Smokeless tobacco: Never Used  . Alcohol Use: Yes     Wine-several times per week  . Drug Use: Not on file  . Sexually Active: Not on file   Other Topics Concern  . Not on file   Social History Narrative   Has living will DNR done 11/12Wife is health care POA.No feeding tube   Review of Systems Sleeps well Appetite is fine Weight is stable     Objective:   Physical Exam  Constitutional: He appears well-developed and well-nourished. No distress.  Neck: Normal range of  motion. Neck supple. No thyromegaly present.  Cardiovascular: Normal rate, regular rhythm and normal heart sounds.  Exam reveals no gallop.   No murmur heard. Pulmonary/Chest: Effort normal and breath sounds normal. No respiratory distress. He has no wheezes. He has no rales.  Musculoskeletal: He exhibits no edema and no tenderness.       No effusions in knees Crepitus bilaterally  Lymphadenopathy:    He has no cervical adenopathy.  Psychiatric: He has a normal mood and affect. His behavior is normal. Judgment and thought content normal.          Assessment & Plan:

## 2011-09-17 NOTE — Assessment & Plan Note (Signed)
Comfortable continuing this Due for labs Stopping fish oil though

## 2011-09-17 NOTE — Assessment & Plan Note (Signed)
Did get good response from cortisone shot [place]  PROCEDURE Sterile prep to left knee  medial approach 2cc 2%plain lido local 40mg  kenalog and 5cc 2% lido instilled without difficulty Tolerated well

## 2011-09-17 NOTE — Assessment & Plan Note (Signed)
Discussed that his symptoms are mainly from this Has name of surgeon to see at [facility] if he wants surgery Not ready for this

## 2011-09-17 NOTE — Assessment & Plan Note (Signed)
Doing well on  Citalopram If he is okay at follow up, will consider weaning at that time

## 2011-09-17 NOTE — Progress Notes (Signed)
Addended by: Sueanne Margarita on: 09/17/2011 12:05 PM   Modules accepted: Orders

## 2011-09-17 NOTE — Assessment & Plan Note (Signed)
Rx 2 years ago Will continue q6 month PSAs

## 2011-09-24 ENCOUNTER — Ambulatory Visit: Payer: Medicare Other | Admitting: Internal Medicine

## 2011-12-04 DIAGNOSIS — D689 Coagulation defect, unspecified: Secondary | ICD-10-CM

## 2011-12-04 HISTORY — DX: Coagulation defect, unspecified: D68.9

## 2011-12-04 HISTORY — PX: KNEE SURGERY: SHX244

## 2011-12-10 DIAGNOSIS — H353290 Exudative age-related macular degeneration, unspecified eye, stage unspecified: Secondary | ICD-10-CM | POA: Diagnosis not present

## 2011-12-27 ENCOUNTER — Telehealth: Payer: Self-pay | Admitting: Internal Medicine

## 2011-12-27 MED ORDER — PRAVASTATIN SODIUM 20 MG PO TABS: 20.0000 mg | ORAL_TABLET | Freq: Every day | ORAL | Status: DC

## 2011-12-27 NOTE — Telephone Encounter (Signed)
Patient came in to office asking to speak to his nurse in regards to his prescription refills. He needs a written prescription for Pravastatin 20mg . Patient mentioned that his old pharmacy would send him forms to be filled out by [place] and then would fax it back but the new pharmacy hasn't sent any forms. The best number he can be contacted is [phone]

## 2011-12-27 NOTE — Telephone Encounter (Signed)
Spoke with patient and he wanted rx sent to [facility], [facility], rx sent to pharmacy by [facility]

## 2012-01-16 DIAGNOSIS — M1710 Unilateral primary osteoarthritis, unspecified knee: Secondary | ICD-10-CM | POA: Diagnosis not present

## 2012-01-16 DIAGNOSIS — M545 Low back pain: Secondary | ICD-10-CM | POA: Diagnosis not present

## 2012-01-29 ENCOUNTER — Telehealth: Payer: Self-pay | Admitting: *Deleted

## 2012-01-29 NOTE — Telephone Encounter (Signed)
Patient called and requested that we fax Juan Horn stating that he has had the flu vaccine.  Copy of flu vaccine faxed to [place] [facility] at [phone].

## 2012-02-01 DIAGNOSIS — M545 Low back pain, unspecified: Secondary | ICD-10-CM | POA: Diagnosis not present

## 2012-02-01 DIAGNOSIS — M5137 Other intervertebral disc degeneration, lumbosacral region: Secondary | ICD-10-CM | POA: Diagnosis not present

## 2012-02-01 DIAGNOSIS — M48061 Spinal stenosis, lumbar region without neurogenic claudication: Secondary | ICD-10-CM | POA: Diagnosis not present

## 2012-02-01 DIAGNOSIS — M25569 Pain in unspecified knee: Secondary | ICD-10-CM | POA: Diagnosis not present

## 2012-02-06 DIAGNOSIS — M1710 Unilateral primary osteoarthritis, unspecified knee: Secondary | ICD-10-CM | POA: Diagnosis not present

## 2012-02-25 DIAGNOSIS — H40009 Preglaucoma, unspecified, unspecified eye: Secondary | ICD-10-CM | POA: Diagnosis not present

## 2012-03-17 ENCOUNTER — Ambulatory Visit (INDEPENDENT_AMBULATORY_CARE_PROVIDER_SITE_OTHER): Payer: Medicare Other | Admitting: Internal Medicine

## 2012-03-17 ENCOUNTER — Encounter: Payer: Self-pay | Admitting: Internal Medicine

## 2012-03-17 VITALS — BP 110/60 | HR 67 | Temp 98.5°F | Ht 68.0 in | Wt 186.0 lb

## 2012-03-17 DIAGNOSIS — F419 Anxiety disorder, unspecified: Secondary | ICD-10-CM

## 2012-03-17 DIAGNOSIS — M1710 Unilateral primary osteoarthritis, unspecified knee: Secondary | ICD-10-CM

## 2012-03-17 DIAGNOSIS — F411 Generalized anxiety disorder: Secondary | ICD-10-CM | POA: Diagnosis not present

## 2012-03-17 DIAGNOSIS — C61 Malignant neoplasm of prostate: Secondary | ICD-10-CM

## 2012-03-17 DIAGNOSIS — M48061 Spinal stenosis, lumbar region without neurogenic claudication: Secondary | ICD-10-CM | POA: Diagnosis not present

## 2012-03-17 LAB — PSA: PSA: 0 ng/mL — ABNORMAL LOW (ref 0.10–4.00)

## 2012-03-17 MED ORDER — CITALOPRAM HYDROBROMIDE 10 MG PO TABS: 10.0000 mg | ORAL_TABLET | Freq: Every day | ORAL | Status: DC

## 2012-03-17 NOTE — Progress Notes (Signed)
Subjective:    Patient ID: Juan Horn, male    DOB: 1933-02-05, 78 y.o.   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 Last 6-7 weeks and is now wearing off Still notes pain if he has extended walking-- 100-200 yards-- just in his knees (left > right) No back pain Occ some calf pain X-rays showed bone on bone arthritis Interested in another opinion about the knees  Mood has been good Anxiety controlled Comfortable continuing on the citalopram  No trouble with cholesterol med  Prostate cancer surgery 9/10 Needs 6 month follow up  Current Outpatient Prescriptions on File Prior to Visit  Medication Sig Dispense Refill  . aspirin 81 MG tablet Take 81 mg by mouth daily.        . citalopram (CELEXA) 10 MG tablet Take 1 tablet (10 mg total) by mouth daily.  90 tablet  3  . Multiple Vitamin (MULTIVITAMIN) tablet Take 1 tablet by mouth daily.        . pravastatin (PRAVACHOL) 20 MG tablet Take 1 tablet (20 mg total) by mouth daily.  90 tablet  3   Current Facility-Administered Medications on File Prior to Visit  Medication Dose Route Frequency Provider Last Rate Last Dose  . DISCONTD: triamcinolone acetonide (KENALOG-40) injection 40 mg  40 mg Intra-articular Once Karie Schwalbe, MD        Allergies  Allergen Reactions  . Eye Drops Other (See Comments)    (Afgan) redness    Past Medical History  Diagnosis Date  . Personal history of prostate cancer   . Hx of colonic polyp   . Diverticulosis of colon   . HLD (hyperlipidemia)   . Glaucoma   . Macular degeneration     legally blind in right eye  . Spinal stenosis of lumbar region   . OA (osteoarthritis)   . Anxiety     Past Surgical History  Procedure Date  . Appendectomy   . Inguinal hernia repair     left  . Prostatectomy   . Tonsillectomy   . Varicose vein surgery     left  . Inguinal hernia  repair 5/12    Dr Beverly Sessions  . Inguinal hernia repair 5/12    Dr Evette Cristal did redo of this    Family History  Problem Relation Age of Onset  . Stroke Father   . Dementia Mother   . Leukemia Sister     History   Social History  . Marital Status: Married    Spouse Name: N/A    Number of Children: 0  . Years of Education: N/A   Occupational History  . 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 Smoking status: Former Smoker    Quit date: 12/03/1978  . Smokeless tobacco: Never Used  . Alcohol Use: Yes     Wine-several times per week  . Drug Use: Not on file  . Sexually Active: Not on file   Other Topics Concern  . Not on file   Social History Narrative   Has living will DNR done 11/12Wife is health care POA.No feeding tube   Review of Systems Sleeps well Appetite is fine Weight is stable or up slightly    Objective:   Physical Exam  Constitutional: He appears well-developed and well-nourished. No distress.  Neck: Normal range of motion. Neck supple. No thyromegaly present.  Cardiovascular: Normal rate, regular rhythm and normal heart sounds.  Exam reveals no gallop.   No murmur heard. Pulmonary/Chest: Effort normal and breath sounds normal. No respiratory distress. He has no wheezes. He has no rales.  Musculoskeletal: He exhibits no edema and no tenderness.       No knee effusions  Lymphadenopathy:    He has no cervical adenopathy.  Psychiatric: He has a normal mood and affect. His behavior is normal.          Assessment & Plan:

## 2012-03-17 NOTE — Assessment & Plan Note (Signed)
Not as clear that his pain on walking is from this

## 2012-03-17 NOTE — Assessment & Plan Note (Signed)
Still wants to keep up 6 month follow ups

## 2012-03-17 NOTE — Assessment & Plan Note (Signed)
Worsening status Relates his limitations to this Will set up eval to consider TKR

## 2012-03-17 NOTE — Assessment & Plan Note (Signed)
Stable on citalopram Will continue

## 2012-03-19 ENCOUNTER — Encounter: Payer: Self-pay | Admitting: *Deleted

## 2012-05-13 DIAGNOSIS — M1710 Unilateral primary osteoarthritis, unspecified knee: Secondary | ICD-10-CM | POA: Diagnosis not present

## 2012-06-09 DIAGNOSIS — L570 Actinic keratosis: Secondary | ICD-10-CM | POA: Diagnosis not present

## 2012-06-09 DIAGNOSIS — H353290 Exudative age-related macular degeneration, unspecified eye, stage unspecified: Secondary | ICD-10-CM | POA: Diagnosis not present

## 2012-06-09 DIAGNOSIS — L821 Other seborrheic keratosis: Secondary | ICD-10-CM | POA: Diagnosis not present

## 2012-06-09 DIAGNOSIS — L719 Rosacea, unspecified: Secondary | ICD-10-CM | POA: Diagnosis not present

## 2012-06-09 DIAGNOSIS — L219 Seborrheic dermatitis, unspecified: Secondary | ICD-10-CM | POA: Diagnosis not present

## 2012-07-07 DIAGNOSIS — H00019 Hordeolum externum unspecified eye, unspecified eyelid: Secondary | ICD-10-CM | POA: Diagnosis not present

## 2012-07-26 DIAGNOSIS — R05 Cough: Secondary | ICD-10-CM | POA: Diagnosis not present

## 2012-08-05 DIAGNOSIS — M1710 Unilateral primary osteoarthritis, unspecified knee: Secondary | ICD-10-CM | POA: Diagnosis not present

## 2012-08-14 DIAGNOSIS — H40009 Preglaucoma, unspecified, unspecified eye: Secondary | ICD-10-CM | POA: Diagnosis not present

## 2012-09-10 DIAGNOSIS — Z23 Encounter for immunization: Secondary | ICD-10-CM | POA: Diagnosis not present

## 2012-09-15 ENCOUNTER — Ambulatory Visit: Payer: Self-pay | Admitting: General Practice

## 2012-09-15 DIAGNOSIS — Z8546 Personal history of malignant neoplasm of prostate: Secondary | ICD-10-CM | POA: Diagnosis not present

## 2012-09-15 DIAGNOSIS — Z9079 Acquired absence of other genital organ(s): Secondary | ICD-10-CM | POA: Diagnosis not present

## 2012-09-15 DIAGNOSIS — Z806 Family history of leukemia: Secondary | ICD-10-CM | POA: Diagnosis not present

## 2012-09-15 DIAGNOSIS — Z01812 Encounter for preprocedural laboratory examination: Secondary | ICD-10-CM | POA: Diagnosis not present

## 2012-09-15 DIAGNOSIS — Z79899 Other long term (current) drug therapy: Secondary | ICD-10-CM | POA: Diagnosis not present

## 2012-09-15 DIAGNOSIS — Z86010 Personal history of colonic polyps: Secondary | ICD-10-CM | POA: Diagnosis not present

## 2012-09-15 DIAGNOSIS — Z9889 Other specified postprocedural states: Secondary | ICD-10-CM | POA: Diagnosis not present

## 2012-09-15 DIAGNOSIS — Z01810 Encounter for preprocedural cardiovascular examination: Secondary | ICD-10-CM | POA: Diagnosis not present

## 2012-09-15 DIAGNOSIS — E785 Hyperlipidemia, unspecified: Secondary | ICD-10-CM | POA: Diagnosis not present

## 2012-09-15 DIAGNOSIS — Z823 Family history of stroke: Secondary | ICD-10-CM | POA: Diagnosis not present

## 2012-09-15 DIAGNOSIS — F411 Generalized anxiety disorder: Secondary | ICD-10-CM | POA: Diagnosis not present

## 2012-09-15 DIAGNOSIS — IMO0002 Reserved for concepts with insufficient information to code with codable children: Secondary | ICD-10-CM | POA: Diagnosis not present

## 2012-09-15 DIAGNOSIS — Z7982 Long term (current) use of aspirin: Secondary | ICD-10-CM | POA: Diagnosis not present

## 2012-09-15 DIAGNOSIS — Z9089 Acquired absence of other organs: Secondary | ICD-10-CM | POA: Diagnosis not present

## 2012-09-15 DIAGNOSIS — K5730 Diverticulosis of large intestine without perforation or abscess without bleeding: Secondary | ICD-10-CM | POA: Diagnosis not present

## 2012-09-15 DIAGNOSIS — M48061 Spinal stenosis, lumbar region without neurogenic claudication: Secondary | ICD-10-CM | POA: Diagnosis not present

## 2012-09-15 LAB — CBC
HCT: 43.7 % (ref 40.0–52.0)
HGB: 15.2 g/dL (ref 13.0–18.0)
Platelet: 150 10*3/uL (ref 150–440)
RDW: 12.8 % (ref 11.5–14.5)
WBC: 5.7 10*3/uL (ref 3.8–10.6)

## 2012-09-15 LAB — SEDIMENTATION RATE: Erythrocyte Sed Rate: 7 mm/hr (ref 0–20)

## 2012-09-15 LAB — BASIC METABOLIC PANEL
Anion Gap: 7 (ref 7–16)
Calcium, Total: 8.6 mg/dL (ref 8.5–10.1)
Chloride: 105 mmol/L (ref 98–107)
Creatinine: 1.21 mg/dL (ref 0.60–1.30)
EGFR (African American): >60
EGFR (Non-African Amer.): 57 — ABNORMAL LOW
Glucose: 171 mg/dL — ABNORMAL HIGH (ref 65–99)
Osmolality: 289 (ref 275–301)
Potassium: 4 mmol/L (ref 3.5–5.1)

## 2012-09-15 LAB — URINALYSIS, COMPLETE
Bacteria: NONE SEEN
Bilirubin,UR: NEGATIVE
Blood: NEGATIVE
Ketone: NEGATIVE
Ph: 5 (ref 4.5–8.0)
Specific Gravity: 1.017 (ref 1.003–1.030)
Squamous Epithelial: <1

## 2012-09-15 LAB — MRSA PCR SCREENING

## 2012-09-15 LAB — PROTIME-INR: INR: 1

## 2012-09-16 DIAGNOSIS — M1710 Unilateral primary osteoarthritis, unspecified knee: Secondary | ICD-10-CM | POA: Diagnosis not present

## 2012-09-17 ENCOUNTER — Telehealth: Payer: Self-pay | Admitting: *Deleted

## 2012-09-17 NOTE — Telephone Encounter (Signed)
I don't have another EKG in his chart  Please call him to set up appt for surgical clearance. I would not be able to write letter clearing him just by looking at the EKG anyway

## 2012-09-17 NOTE — Telephone Encounter (Signed)
Patient is scheduled for a total knee replacement by Dr. Ernest Pine 10/06/12. They faxed over a copy of an EKG that they did for you to compare with a previous EKG that you may have. They need a letter stating that patient is cleared for surgery. Please advise if patient needs an appointment with you or a cardiac workup before surgery. Cordelia Pen requested that you contact the patient if he needs to be seen prior to his surgery.  Fax is in your in box from [facility]

## 2012-09-17 NOTE — Telephone Encounter (Signed)
Left message on machine that he needs to schedule surgical clearance with Dr.Letvak, advised pt to call the office.

## 2012-09-18 ENCOUNTER — Ambulatory Visit (INDEPENDENT_AMBULATORY_CARE_PROVIDER_SITE_OTHER): Payer: Medicare Other | Admitting: Internal Medicine

## 2012-09-18 ENCOUNTER — Encounter: Payer: Self-pay | Admitting: Internal Medicine

## 2012-09-18 VITALS — BP 120/70 | HR 62 | Temp 98.1°F | Wt 190.0 lb

## 2012-09-18 DIAGNOSIS — R9431 Abnormal electrocardiogram [ECG] [EKG]: Secondary | ICD-10-CM | POA: Diagnosis not present

## 2012-09-18 NOTE — Assessment & Plan Note (Signed)
Has RBBB that goes back at least to may 2012 Has not had any cardiac history or symptoms so I have not checked EKG before today History and exam are normal EKG shows sinus rhythm with complete RBBB No diagnostic Q waves in inferior (or any other leads) No contraindications to TKR as proposed I recommend proceeding with usual protocols and DVT prophylaxis I will see him upon rehab admission to [facility]

## 2012-09-18 NOTE — Progress Notes (Signed)
Subjective:    Patient ID: Juan Horn, male    DOB: 08-26-1933, 78 y.o.   MRN: [id number]  HPI Due for TKR on November 4th with Dr Sim Boast for preops and EKG showed RBBB (not new) but possible new inferior MI  No chest pain No SOB Limited with walking due to knee, not dyspnea Trouble on steps --but again only due to knee and leg problems No dizziness or syncope No edema  Current Outpatient Prescriptions on File Prior to Visit  Medication Sig Dispense Refill  . aspirin 81 MG tablet Take 81 mg by mouth daily.        . citalopram (CELEXA) 10 MG tablet Take 1 tablet (10 mg total) by mouth daily.  90 tablet  3  . Multiple Vitamin (MULTIVITAMIN) tablet Take 1 tablet by mouth daily.        . pravastatin (PRAVACHOL) 20 MG tablet Take 1 tablet (20 mg total) by mouth daily.  90 tablet  3    Allergies  Allergen Reactions  . Naphazoline-Polyethyl Glycol Other (See Comments)    (Afgan) redness    Past Medical History  Diagnosis Date  . Personal history of prostate cancer   . Hx of colonic polyp   . Diverticulosis of colon   . HLD (hyperlipidemia)   . Glaucoma(365)   . Macular degeneration     legally blind in right eye  . Spinal stenosis of lumbar region   . OA (osteoarthritis)   . Anxiety     Past Surgical History  Procedure Date  . Appendectomy   . Inguinal hernia repair     left  . Prostatectomy   . Tonsillectomy   . Varicose vein surgery     left  . Inguinal hernia repair 5/12    Dr Beverly Sessions  . Inguinal hernia repair 5/12    Dr Evette Cristal did redo of this    Family History  Problem Relation Age of Onset  . Stroke Father   . Dementia Mother   . Leukemia Sister     History   Social History  . Marital Status: Married    Spouse Name: N/A    Number of Children: 0  . Years of Education: N/A   Occupational History  . 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 Smoking status: Former Smoker    Quit date: 12/03/1978  .  Smokeless tobacco: Never Used  . Alcohol Use: Yes     Wine-several times per week  . Drug Use: Not on file  . Sexually Active: Not on file   Other Topics Concern  . Not on file   Social History Narrative   Has living will DNR done 11/12Wife is health care POA.No feeding tube   Review of Systems Sleeps well No PND or orthopnea    Objective:   Physical Exam  Constitutional: He appears well-developed and well-nourished. No distress.  Neck: Normal range of motion. Neck supple. No JVD present.  Cardiovascular: Normal rate, regular rhythm and normal heart sounds.  Exam reveals no gallop.   No murmur heard. Pulmonary/Chest: Effort normal and breath sounds normal. No respiratory distress. He has no wheezes. He has no rales. He exhibits no tenderness.  Abdominal: Soft. There is no tenderness.  Musculoskeletal: He exhibits no edema and no tenderness.  Lymphadenopathy:    He has no cervical adenopathy.  Psychiatric: He has a normal mood and affect. His behavior is normal.  Assessment & Plan:

## 2012-10-03 DIAGNOSIS — I2699 Other pulmonary embolism without acute cor pulmonale: Secondary | ICD-10-CM

## 2012-10-03 HISTORY — PX: JOINT REPLACEMENT: SHX530

## 2012-10-03 HISTORY — DX: Other pulmonary embolism without acute cor pulmonale: I26.99

## 2012-10-06 ENCOUNTER — Inpatient Hospital Stay: Payer: Self-pay | Admitting: General Practice

## 2012-10-06 DIAGNOSIS — H3530 Unspecified macular degeneration: Secondary | ICD-10-CM | POA: Diagnosis present

## 2012-10-06 DIAGNOSIS — H409 Unspecified glaucoma: Secondary | ICD-10-CM | POA: Diagnosis present

## 2012-10-06 DIAGNOSIS — M48061 Spinal stenosis, lumbar region without neurogenic claudication: Secondary | ICD-10-CM | POA: Diagnosis present

## 2012-10-06 DIAGNOSIS — M1710 Unilateral primary osteoarthritis, unspecified knee: Secondary | ICD-10-CM | POA: Diagnosis not present

## 2012-10-06 DIAGNOSIS — M6281 Muscle weakness (generalized): Secondary | ICD-10-CM | POA: Diagnosis not present

## 2012-10-06 DIAGNOSIS — E785 Hyperlipidemia, unspecified: Secondary | ICD-10-CM | POA: Diagnosis present

## 2012-10-06 DIAGNOSIS — R262 Difficulty in walking, not elsewhere classified: Secondary | ICD-10-CM | POA: Diagnosis not present

## 2012-10-06 DIAGNOSIS — K5730 Diverticulosis of large intestine without perforation or abscess without bleeding: Secondary | ICD-10-CM | POA: Diagnosis present

## 2012-10-06 DIAGNOSIS — Z471 Aftercare following joint replacement surgery: Secondary | ICD-10-CM | POA: Diagnosis not present

## 2012-10-06 DIAGNOSIS — Z8546 Personal history of malignant neoplasm of prostate: Secondary | ICD-10-CM | POA: Diagnosis not present

## 2012-10-06 DIAGNOSIS — F411 Generalized anxiety disorder: Secondary | ICD-10-CM | POA: Diagnosis present

## 2012-10-06 DIAGNOSIS — Z86010 Personal history of colonic polyps: Secondary | ICD-10-CM | POA: Diagnosis not present

## 2012-10-06 DIAGNOSIS — Z96659 Presence of unspecified artificial knee joint: Secondary | ICD-10-CM | POA: Diagnosis not present

## 2012-10-06 DIAGNOSIS — R6889 Other general symptoms and signs: Secondary | ICD-10-CM | POA: Diagnosis not present

## 2012-10-07 LAB — BASIC METABOLIC PANEL
Calcium, Total: 8 mg/dL — ABNORMAL LOW (ref 8.5–10.1)
Chloride: 104 mmol/L (ref 98–107)
Co2: 27 mmol/L (ref 21–32)
EGFR (African American): >60
Glucose: 103 mg/dL — ABNORMAL HIGH (ref 65–99)
Osmolality: 276 (ref 275–301)
Potassium: 4.1 mmol/L (ref 3.5–5.1)

## 2012-10-07 LAB — PLATELET COUNT: Platelet: 115 10*3/uL — ABNORMAL LOW (ref 150–440)

## 2012-10-08 LAB — BASIC METABOLIC PANEL
BUN: 9 mg/dL (ref 7–18)
Calcium, Total: 7.9 mg/dL — ABNORMAL LOW (ref 8.5–10.1)
Co2: 26 mmol/L (ref 21–32)
Creatinine: 0.89 mg/dL (ref 0.60–1.30)
EGFR (Non-African Amer.): >60
Glucose: 129 mg/dL — ABNORMAL HIGH (ref 65–99)
Osmolality: 276 (ref 275–301)
Potassium: 3.4 mmol/L — ABNORMAL LOW (ref 3.5–5.1)
Sodium: 138 mmol/L (ref 136–145)

## 2012-10-08 LAB — HEMOGLOBIN: HGB: 13.4 g/dL (ref 13.0–18.0)

## 2012-10-09 DIAGNOSIS — I2699 Other pulmonary embolism without acute cor pulmonale: Secondary | ICD-10-CM | POA: Diagnosis present

## 2012-10-09 DIAGNOSIS — E785 Hyperlipidemia, unspecified: Secondary | ICD-10-CM | POA: Diagnosis present

## 2012-10-09 DIAGNOSIS — H3530 Unspecified macular degeneration: Secondary | ICD-10-CM | POA: Diagnosis not present

## 2012-10-09 DIAGNOSIS — R143 Flatulence: Secondary | ICD-10-CM | POA: Diagnosis not present

## 2012-10-09 DIAGNOSIS — K560 Paralytic ileus: Secondary | ICD-10-CM | POA: Diagnosis not present

## 2012-10-09 DIAGNOSIS — Z96659 Presence of unspecified artificial knee joint: Secondary | ICD-10-CM | POA: Diagnosis not present

## 2012-10-09 DIAGNOSIS — R000 Tachycardia, unspecified: Secondary | ICD-10-CM | POA: Diagnosis not present

## 2012-10-09 DIAGNOSIS — M6281 Muscle weakness (generalized): Secondary | ICD-10-CM | POA: Diagnosis not present

## 2012-10-09 DIAGNOSIS — K929 Disease of digestive system, unspecified: Secondary | ICD-10-CM | POA: Diagnosis present

## 2012-10-09 DIAGNOSIS — J9600 Acute respiratory failure, unspecified whether with hypoxia or hypercapnia: Secondary | ICD-10-CM | POA: Diagnosis present

## 2012-10-09 DIAGNOSIS — Z6829 Body mass index (BMI) 29.0-29.9, adult: Secondary | ICD-10-CM | POA: Diagnosis not present

## 2012-10-09 DIAGNOSIS — R6889 Other general symptoms and signs: Secondary | ICD-10-CM | POA: Diagnosis not present

## 2012-10-09 DIAGNOSIS — R062 Wheezing: Secondary | ICD-10-CM | POA: Diagnosis not present

## 2012-10-09 DIAGNOSIS — F411 Generalized anxiety disorder: Secondary | ICD-10-CM | POA: Diagnosis not present

## 2012-10-09 DIAGNOSIS — E669 Obesity, unspecified: Secondary | ICD-10-CM | POA: Diagnosis present

## 2012-10-09 DIAGNOSIS — M48061 Spinal stenosis, lumbar region without neurogenic claudication: Secondary | ICD-10-CM | POA: Diagnosis not present

## 2012-10-09 DIAGNOSIS — F329 Major depressive disorder, single episode, unspecified: Secondary | ICD-10-CM | POA: Diagnosis present

## 2012-10-09 DIAGNOSIS — Z8546 Personal history of malignant neoplasm of prostate: Secondary | ICD-10-CM | POA: Diagnosis not present

## 2012-10-09 DIAGNOSIS — R262 Difficulty in walking, not elsewhere classified: Secondary | ICD-10-CM | POA: Diagnosis not present

## 2012-10-09 DIAGNOSIS — Z7982 Long term (current) use of aspirin: Secondary | ICD-10-CM | POA: Diagnosis not present

## 2012-10-09 DIAGNOSIS — R0602 Shortness of breath: Secondary | ICD-10-CM | POA: Diagnosis not present

## 2012-10-10 ENCOUNTER — Inpatient Hospital Stay: Payer: Self-pay | Admitting: Internal Medicine

## 2012-10-10 DIAGNOSIS — E669 Obesity, unspecified: Secondary | ICD-10-CM | POA: Diagnosis present

## 2012-10-10 DIAGNOSIS — I2699 Other pulmonary embolism without acute cor pulmonale: Secondary | ICD-10-CM | POA: Diagnosis not present

## 2012-10-10 DIAGNOSIS — F329 Major depressive disorder, single episode, unspecified: Secondary | ICD-10-CM | POA: Diagnosis present

## 2012-10-10 DIAGNOSIS — R109 Unspecified abdominal pain: Secondary | ICD-10-CM | POA: Diagnosis not present

## 2012-10-10 DIAGNOSIS — R6889 Other general symptoms and signs: Secondary | ICD-10-CM | POA: Diagnosis not present

## 2012-10-10 DIAGNOSIS — K929 Disease of digestive system, unspecified: Secondary | ICD-10-CM | POA: Diagnosis not present

## 2012-10-10 DIAGNOSIS — M6281 Muscle weakness (generalized): Secondary | ICD-10-CM | POA: Diagnosis not present

## 2012-10-10 DIAGNOSIS — R062 Wheezing: Secondary | ICD-10-CM | POA: Diagnosis not present

## 2012-10-10 DIAGNOSIS — K6389 Other specified diseases of intestine: Secondary | ICD-10-CM | POA: Diagnosis not present

## 2012-10-10 DIAGNOSIS — Z96659 Presence of unspecified artificial knee joint: Secondary | ICD-10-CM | POA: Diagnosis not present

## 2012-10-10 DIAGNOSIS — R000 Tachycardia, unspecified: Secondary | ICD-10-CM | POA: Diagnosis not present

## 2012-10-10 DIAGNOSIS — E785 Hyperlipidemia, unspecified: Secondary | ICD-10-CM | POA: Diagnosis present

## 2012-10-10 DIAGNOSIS — H3530 Unspecified macular degeneration: Secondary | ICD-10-CM | POA: Diagnosis not present

## 2012-10-10 DIAGNOSIS — K560 Paralytic ileus: Secondary | ICD-10-CM | POA: Diagnosis present

## 2012-10-10 DIAGNOSIS — F411 Generalized anxiety disorder: Secondary | ICD-10-CM | POA: Diagnosis not present

## 2012-10-10 DIAGNOSIS — Z7982 Long term (current) use of aspirin: Secondary | ICD-10-CM | POA: Diagnosis not present

## 2012-10-10 DIAGNOSIS — J9600 Acute respiratory failure, unspecified whether with hypoxia or hypercapnia: Secondary | ICD-10-CM | POA: Diagnosis present

## 2012-10-10 DIAGNOSIS — R0602 Shortness of breath: Secondary | ICD-10-CM | POA: Diagnosis not present

## 2012-10-10 DIAGNOSIS — R143 Flatulence: Secondary | ICD-10-CM | POA: Diagnosis not present

## 2012-10-10 DIAGNOSIS — J984 Other disorders of lung: Secondary | ICD-10-CM | POA: Diagnosis not present

## 2012-10-10 DIAGNOSIS — Z6829 Body mass index (BMI) 29.0-29.9, adult: Secondary | ICD-10-CM | POA: Diagnosis not present

## 2012-10-10 DIAGNOSIS — Z8546 Personal history of malignant neoplasm of prostate: Secondary | ICD-10-CM | POA: Diagnosis not present

## 2012-10-10 DIAGNOSIS — R262 Difficulty in walking, not elsewhere classified: Secondary | ICD-10-CM | POA: Diagnosis not present

## 2012-10-10 DIAGNOSIS — M48061 Spinal stenosis, lumbar region without neurogenic claudication: Secondary | ICD-10-CM | POA: Diagnosis not present

## 2012-10-10 LAB — CBC
MCH: 32.8 pg (ref 26.0–34.0)
MCHC: 34.8 g/dL (ref 32.0–36.0)
MCV: 94 fL (ref 80–100)
Platelet: 199 10*3/uL (ref 150–440)

## 2012-10-10 LAB — COMPREHENSIVE METABOLIC PANEL
Albumin: 3.2 g/dL — ABNORMAL LOW (ref 3.4–5.0)
Alkaline Phosphatase: 77 U/L (ref 50–136)
Bilirubin,Total: 1.4 mg/dL — ABNORMAL HIGH (ref 0.2–1.0)
Calcium, Total: 8.5 mg/dL (ref 8.5–10.1)
Chloride: 97 mmol/L — ABNORMAL LOW (ref 98–107)
Creatinine: 1.12 mg/dL (ref 0.60–1.30)
EGFR (African American): >60
EGFR (Non-African Amer.): >60
Glucose: 132 mg/dL — ABNORMAL HIGH (ref 65–99)
Osmolality: 274 (ref 275–301)
Potassium: 3.5 mmol/L (ref 3.5–5.1)
SGPT (ALT): 33 U/L (ref 12–78)
Sodium: 136 mmol/L (ref 136–145)

## 2012-10-10 LAB — TROPONIN I: Troponin-I: <0.02 ng/mL

## 2012-10-10 LAB — CK TOTAL AND CKMB (NOT AT ARMC): CK, Total: 435 U/L — ABNORMAL HIGH (ref 35–232)

## 2012-10-10 LAB — APTT: Activated PTT: 63.5 secs — ABNORMAL HIGH (ref 23.6–35.9)

## 2012-10-11 DIAGNOSIS — J984 Other disorders of lung: Secondary | ICD-10-CM

## 2012-10-11 LAB — CBC WITH DIFFERENTIAL/PLATELET
Basophil #: 0 10*3/uL (ref 0.0–0.1)
Eosinophil #: 0.1 10*3/uL (ref 0.0–0.7)
Lymphocyte #: 1.4 10*3/uL (ref 1.0–3.6)
Lymphocyte %: 13.7 %
Monocyte %: 10.9 %
Platelet: 203 10*3/uL (ref 150–440)
RDW: 13.2 % (ref 11.5–14.5)
WBC: 10.1 10*3/uL (ref 3.8–10.6)

## 2012-10-11 LAB — BASIC METABOLIC PANEL
Calcium, Total: 8.1 mg/dL — ABNORMAL LOW (ref 8.5–10.1)
Chloride: 98 mmol/L (ref 98–107)
Co2: 29 mmol/L (ref 21–32)
Creatinine: 0.64 mg/dL (ref 0.60–1.30)
Osmolality: 272 (ref 275–301)
Potassium: 3.1 mmol/L — ABNORMAL LOW (ref 3.5–5.1)
Sodium: 136 mmol/L (ref 136–145)

## 2012-10-11 LAB — PROTIME-INR
INR: 1.1
Prothrombin Time: 14.7 secs (ref 11.5–14.7)

## 2012-10-11 LAB — LIPID PANEL
Cholesterol: 147 mg/dL (ref 0–200)
Ldl Cholesterol, Calc: 78 mg/dL (ref 0–100)
VLDL Cholesterol, Calc: 17 mg/dL (ref 5–40)

## 2012-10-11 LAB — CLOSTRIDIUM DIFFICILE BY PCR

## 2012-10-12 LAB — CBC WITH DIFFERENTIAL/PLATELET
Basophil %: 0.3 %
Eosinophil #: 0.2 10*3/uL (ref 0.0–0.7)
Eosinophil %: 2.9 %
HCT: 33.4 % — ABNORMAL LOW (ref 40.0–52.0)
HGB: 11.5 g/dL — ABNORMAL LOW (ref 13.0–18.0)
Lymphocyte #: 1.7 10*3/uL (ref 1.0–3.6)
MCHC: 34.4 g/dL (ref 32.0–36.0)
Monocyte #: 1 x10 3/mm (ref 0.2–1.0)
Monocyte %: 11.8 %
Platelet: 185 10*3/uL (ref 150–440)
RBC: 3.55 10*6/uL — ABNORMAL LOW (ref 4.40–5.90)
WBC: 8.2 10*3/uL (ref 3.8–10.6)

## 2012-10-12 LAB — BASIC METABOLIC PANEL
BUN: 9 mg/dL (ref 7–18)
Calcium, Total: 7.7 mg/dL — ABNORMAL LOW (ref 8.5–10.1)
Chloride: 103 mmol/L (ref 98–107)
Co2: 30 mmol/L (ref 21–32)
EGFR (African American): >60
Glucose: 102 mg/dL — ABNORMAL HIGH (ref 65–99)
Osmolality: 282 (ref 275–301)
Potassium: 2.8 mmol/L — ABNORMAL LOW (ref 3.5–5.1)

## 2012-10-12 LAB — PROTIME-INR: Prothrombin Time: 20.1 secs — ABNORMAL HIGH (ref 11.5–14.7)

## 2012-10-15 ENCOUNTER — Ambulatory Visit: Payer: Medicare Other

## 2012-10-15 ENCOUNTER — Telehealth: Payer: Self-pay

## 2012-10-15 DIAGNOSIS — G8918 Other acute postprocedural pain: Secondary | ICD-10-CM | POA: Diagnosis not present

## 2012-10-15 DIAGNOSIS — M6281 Muscle weakness (generalized): Secondary | ICD-10-CM | POA: Diagnosis not present

## 2012-10-15 DIAGNOSIS — I2699 Other pulmonary embolism without acute cor pulmonale: Secondary | ICD-10-CM | POA: Diagnosis not present

## 2012-10-15 DIAGNOSIS — Z471 Aftercare following joint replacement surgery: Secondary | ICD-10-CM | POA: Diagnosis not present

## 2012-10-15 DIAGNOSIS — R269 Unspecified abnormalities of gait and mobility: Secondary | ICD-10-CM | POA: Diagnosis not present

## 2012-10-15 DIAGNOSIS — K929 Disease of digestive system, unspecified: Secondary | ICD-10-CM | POA: Diagnosis not present

## 2012-10-15 NOTE — Telephone Encounter (Signed)
Mardene Celeste nurse with [facility] called PT 39.1 and INR 3.3. Pt presently taking Coumadin 3 mg tabs taking 6 mg daily.Mardene Celeste said pt looked great, minimal bruising,no nosebleed. Pt has appt to see Dr Alphonsus Sias 10/16/12.Please advise.

## 2012-10-15 NOTE — Telephone Encounter (Signed)
Spoke with nurse Mardene Celeste and advised results

## 2012-10-15 NOTE — Telephone Encounter (Signed)
I would continue as is and have Dr. Alphonsus Sias make any changes if needed tomorrow. Thanks.

## 2012-10-15 NOTE — Telephone Encounter (Signed)
Please have him continue his current dose We will recheck at his visit on Friday the 15th

## 2012-10-15 NOTE — Telephone Encounter (Signed)
Will route to Dr. Elbert Ewings as Lorain Childes.

## 2012-10-16 DIAGNOSIS — I2699 Other pulmonary embolism without acute cor pulmonale: Secondary | ICD-10-CM | POA: Diagnosis not present

## 2012-10-16 DIAGNOSIS — M6281 Muscle weakness (generalized): Secondary | ICD-10-CM | POA: Diagnosis not present

## 2012-10-16 DIAGNOSIS — K929 Disease of digestive system, unspecified: Secondary | ICD-10-CM | POA: Diagnosis not present

## 2012-10-16 DIAGNOSIS — Z471 Aftercare following joint replacement surgery: Secondary | ICD-10-CM | POA: Diagnosis not present

## 2012-10-16 DIAGNOSIS — R269 Unspecified abnormalities of gait and mobility: Secondary | ICD-10-CM | POA: Diagnosis not present

## 2012-10-16 DIAGNOSIS — G8918 Other acute postprocedural pain: Secondary | ICD-10-CM | POA: Diagnosis not present

## 2012-10-17 ENCOUNTER — Ambulatory Visit (INDEPENDENT_AMBULATORY_CARE_PROVIDER_SITE_OTHER): Payer: Medicare Other | Admitting: Internal Medicine

## 2012-10-17 ENCOUNTER — Encounter: Payer: Self-pay | Admitting: Internal Medicine

## 2012-10-17 VITALS — BP 128/80 | HR 94 | Temp 98.2°F | Wt 184.0 lb

## 2012-10-17 DIAGNOSIS — M1710 Unilateral primary osteoarthritis, unspecified knee: Secondary | ICD-10-CM

## 2012-10-17 DIAGNOSIS — K567 Ileus, unspecified: Secondary | ICD-10-CM | POA: Insufficient documentation

## 2012-10-17 DIAGNOSIS — I2699 Other pulmonary embolism without acute cor pulmonale: Secondary | ICD-10-CM

## 2012-10-17 DIAGNOSIS — K929 Disease of digestive system, unspecified: Secondary | ICD-10-CM | POA: Diagnosis not present

## 2012-10-17 DIAGNOSIS — G8918 Other acute postprocedural pain: Secondary | ICD-10-CM | POA: Diagnosis not present

## 2012-10-17 DIAGNOSIS — K560 Paralytic ileus: Secondary | ICD-10-CM | POA: Diagnosis not present

## 2012-10-17 DIAGNOSIS — Z471 Aftercare following joint replacement surgery: Secondary | ICD-10-CM | POA: Diagnosis not present

## 2012-10-17 DIAGNOSIS — M6281 Muscle weakness (generalized): Secondary | ICD-10-CM | POA: Diagnosis not present

## 2012-10-17 DIAGNOSIS — Z5181 Encounter for therapeutic drug level monitoring: Secondary | ICD-10-CM

## 2012-10-17 DIAGNOSIS — Z7901 Long term (current) use of anticoagulants: Secondary | ICD-10-CM

## 2012-10-17 DIAGNOSIS — R269 Unspecified abnormalities of gait and mobility: Secondary | ICD-10-CM | POA: Diagnosis not present

## 2012-10-17 NOTE — Assessment & Plan Note (Signed)
Seems to be better now Eating well Still with loose stools but doesn't seem to be due to retained stool

## 2012-10-17 NOTE — Assessment & Plan Note (Signed)
Post op Doing better now On coumadin---will plan 3 months of Rx since just had TKR

## 2012-10-17 NOTE — Patient Instructions (Addendum)
Continue 6 mg daily, recheck 1 week.

## 2012-10-17 NOTE — Assessment & Plan Note (Signed)
Doing well since the surgery Has follow up with Dr Ernest Pine

## 2012-10-17 NOTE — Progress Notes (Signed)
Subjective:    Patient ID: Juan Horn, male    DOB: 1933/05/10, 78 y.o.   MRN: [id number]  HPI Here with wife  Had TKR then post op pulmonary embolus Readmitted for more aggressive anticoagulation Now on coumadin  No chest pain Still has SOB----PT feels he needs to work on his breathing (and she is helping with this) Has some swelling on left leg where TKR done Using the [facility] for ongoing cool treatment Some pain but only takes meds before therapy for the most part  Walks with walker Bathroom on his own--wife supervises Not able to go in shower yet---sees Dr Ernest Pine next week  Had post op ileus Appetite is okay Bowels are moving but now liquidly  Current Outpatient Prescriptions on File Prior to Visit  Medication Sig Dispense Refill  . aspirin 81 MG tablet Take 81 mg by mouth daily.        . citalopram (CELEXA) 10 MG tablet Take 1 tablet (10 mg total) by mouth daily.  90 tablet  3  . Multiple Vitamin (MULTIVITAMIN) tablet Take 1 tablet by mouth daily.        . potassium chloride SA (K-DUR,KLOR-CON) 20 MEQ tablet Take 20 mEq by mouth daily.      . pravastatin (PRAVACHOL) 20 MG tablet Take 1 tablet (20 mg total) by mouth daily.  90 tablet  3  . warfarin (COUMADIN) 6 MG tablet Take 6 mg by mouth daily.        Allergies  Allergen Reactions  . Naphazoline-Polyethyl Glycol Other (See Comments)    (Afgan) redness    Past Medical History  Diagnosis Date  . Personal history of prostate cancer   . Hx of colonic polyp   . Diverticulosis of colon   . HLD (hyperlipidemia)   . Glaucoma(365)   . Macular degeneration     legally blind in right eye  . Spinal stenosis of lumbar region   . OA (osteoarthritis)   . Anxiety   . Pulmonary embolism 11/13    post op TKR    Past Surgical History  Procedure Date  . Appendectomy   . Inguinal hernia repair     left  . Prostatectomy   . Tonsillectomy   . Varicose vein surgery     left  . Inguinal hernia repair 5/12    Dr  Beverly Sessions  . Inguinal hernia repair 5/12    Dr Evette Cristal did redo of this  . Joint replacement 11/13    Left total knee--Dr Hooten    Family History  Problem Relation Age of Onset  . Stroke Father   . Dementia Mother   . Leukemia Sister     History   Social History  . Marital Status: Married    Spouse Name: N/A    Number of Children: 0  . Years of Education: N/A   Occupational History  . 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 Smoking status: Former Smoker    Quit date: 12/03/1978  . Smokeless tobacco: Never Used  . Alcohol Use: Yes     Comment: Wine-several times per week  . Drug Use: Not on file  . Sexually Active: Not on file   Other Topics Concern  . Not on file   Social History Narrative   Has living will DNR done 11/12Wife is health care POA.No feeding tube   Review of Systems No problems sleeping Voiding fine    Objective:   Physical Exam  Constitutional: He appears well-developed and well-nourished. No distress.  Neck: Normal range of motion. Neck supple. No thyromegaly present.  Cardiovascular: Normal rate, regular rhythm and normal heart sounds.  Exam reveals no gallop.   No murmur heard. Pulmonary/Chest: Effort normal and breath sounds normal. No respiratory distress. He has no wheezes. He has no rales.  Abdominal: Soft. Bowel sounds are normal. There is no tenderness.  Musculoskeletal: He exhibits edema.       Mild left calf swelling without tenderness  Lymphadenopathy:    He has no cervical adenopathy.  Psychiatric: He has a normal mood and affect. His behavior is normal.          Assessment & Plan:

## 2012-10-20 DIAGNOSIS — Z471 Aftercare following joint replacement surgery: Secondary | ICD-10-CM | POA: Diagnosis not present

## 2012-10-20 DIAGNOSIS — R269 Unspecified abnormalities of gait and mobility: Secondary | ICD-10-CM | POA: Diagnosis not present

## 2012-10-20 DIAGNOSIS — G8918 Other acute postprocedural pain: Secondary | ICD-10-CM | POA: Diagnosis not present

## 2012-10-20 DIAGNOSIS — K929 Disease of digestive system, unspecified: Secondary | ICD-10-CM | POA: Diagnosis not present

## 2012-10-20 DIAGNOSIS — M6281 Muscle weakness (generalized): Secondary | ICD-10-CM | POA: Diagnosis not present

## 2012-10-20 DIAGNOSIS — I2699 Other pulmonary embolism without acute cor pulmonale: Secondary | ICD-10-CM | POA: Diagnosis not present

## 2012-10-21 ENCOUNTER — Encounter: Payer: Medicare Other | Admitting: Internal Medicine

## 2012-10-21 DIAGNOSIS — R269 Unspecified abnormalities of gait and mobility: Secondary | ICD-10-CM | POA: Diagnosis not present

## 2012-10-21 DIAGNOSIS — Z471 Aftercare following joint replacement surgery: Secondary | ICD-10-CM | POA: Diagnosis not present

## 2012-10-21 DIAGNOSIS — M6281 Muscle weakness (generalized): Secondary | ICD-10-CM | POA: Diagnosis not present

## 2012-10-21 DIAGNOSIS — I2699 Other pulmonary embolism without acute cor pulmonale: Secondary | ICD-10-CM | POA: Diagnosis not present

## 2012-10-21 DIAGNOSIS — G8918 Other acute postprocedural pain: Secondary | ICD-10-CM | POA: Diagnosis not present

## 2012-10-21 DIAGNOSIS — K929 Disease of digestive system, unspecified: Secondary | ICD-10-CM | POA: Diagnosis not present

## 2012-10-22 DIAGNOSIS — R269 Unspecified abnormalities of gait and mobility: Secondary | ICD-10-CM | POA: Diagnosis not present

## 2012-10-22 DIAGNOSIS — I2699 Other pulmonary embolism without acute cor pulmonale: Secondary | ICD-10-CM | POA: Diagnosis not present

## 2012-10-22 DIAGNOSIS — Z471 Aftercare following joint replacement surgery: Secondary | ICD-10-CM | POA: Diagnosis not present

## 2012-10-22 DIAGNOSIS — M6281 Muscle weakness (generalized): Secondary | ICD-10-CM | POA: Diagnosis not present

## 2012-10-22 DIAGNOSIS — K929 Disease of digestive system, unspecified: Secondary | ICD-10-CM | POA: Diagnosis not present

## 2012-10-22 DIAGNOSIS — G8918 Other acute postprocedural pain: Secondary | ICD-10-CM | POA: Diagnosis not present

## 2012-10-23 DIAGNOSIS — K929 Disease of digestive system, unspecified: Secondary | ICD-10-CM | POA: Diagnosis not present

## 2012-10-23 DIAGNOSIS — Z471 Aftercare following joint replacement surgery: Secondary | ICD-10-CM | POA: Diagnosis not present

## 2012-10-23 DIAGNOSIS — R269 Unspecified abnormalities of gait and mobility: Secondary | ICD-10-CM | POA: Diagnosis not present

## 2012-10-23 DIAGNOSIS — G8918 Other acute postprocedural pain: Secondary | ICD-10-CM | POA: Diagnosis not present

## 2012-10-23 DIAGNOSIS — I2699 Other pulmonary embolism without acute cor pulmonale: Secondary | ICD-10-CM | POA: Diagnosis not present

## 2012-10-23 DIAGNOSIS — M6281 Muscle weakness (generalized): Secondary | ICD-10-CM | POA: Diagnosis not present

## 2012-10-24 ENCOUNTER — Telehealth: Payer: Self-pay | Admitting: Internal Medicine

## 2012-10-24 ENCOUNTER — Telehealth: Payer: Self-pay

## 2012-10-24 ENCOUNTER — Ambulatory Visit (INDEPENDENT_AMBULATORY_CARE_PROVIDER_SITE_OTHER): Payer: Medicare Other | Admitting: Internal Medicine

## 2012-10-24 DIAGNOSIS — Z7901 Long term (current) use of anticoagulants: Secondary | ICD-10-CM

## 2012-10-24 DIAGNOSIS — K929 Disease of digestive system, unspecified: Secondary | ICD-10-CM | POA: Diagnosis not present

## 2012-10-24 DIAGNOSIS — I2699 Other pulmonary embolism without acute cor pulmonale: Secondary | ICD-10-CM | POA: Diagnosis not present

## 2012-10-24 DIAGNOSIS — R269 Unspecified abnormalities of gait and mobility: Secondary | ICD-10-CM | POA: Diagnosis not present

## 2012-10-24 DIAGNOSIS — Z5181 Encounter for therapeutic drug level monitoring: Secondary | ICD-10-CM

## 2012-10-24 DIAGNOSIS — Z471 Aftercare following joint replacement surgery: Secondary | ICD-10-CM | POA: Diagnosis not present

## 2012-10-24 DIAGNOSIS — M6281 Muscle weakness (generalized): Secondary | ICD-10-CM | POA: Diagnosis not present

## 2012-10-24 DIAGNOSIS — G8918 Other acute postprocedural pain: Secondary | ICD-10-CM | POA: Diagnosis not present

## 2012-10-24 MED ORDER — WARFARIN SODIUM 6 MG PO TABS: 6.0000 mg | ORAL_TABLET | Freq: Every day | ORAL | Status: DC

## 2012-10-24 NOTE — Telephone Encounter (Signed)
Pt came to lab today for PT/INR; pt wants to know if should continue taking Potassium that was prescribed in hospital; if pt is to continue Potassium needs rx [facility].Please advise.   Pt also requested Warfarin 6 mg refilled.(Warfarin refill done.)

## 2012-10-24 NOTE — Telephone Encounter (Signed)
Spoke with nurse and patient will come here for PT/INR

## 2012-10-24 NOTE — Telephone Encounter (Signed)
Spoke with patient and advised results   

## 2012-10-24 NOTE — Telephone Encounter (Signed)
I am not sure that he needs the potassium now Have him stop and I will recheck his labs at his upcoming visit in December (or actually have Terri add met b at his next protime)

## 2012-10-24 NOTE — Patient Instructions (Signed)
Continue 6 mg daily, recheck 2 weeks

## 2012-10-24 NOTE — Telephone Encounter (Signed)
The patient's home health nurse called hoping to get clarification if the doctor prefers his coumadin to be checked in his home, or if the dr wants him to come in to the office as scheduled.  Her callback is [phone]

## 2012-10-24 NOTE — Telephone Encounter (Signed)
I prefer for him to come here if he can Okay to check by nurse at home while home health is in though--esp if he prefers that

## 2012-10-27 ENCOUNTER — Telehealth: Payer: Self-pay

## 2012-10-27 DIAGNOSIS — Z96659 Presence of unspecified artificial knee joint: Secondary | ICD-10-CM | POA: Diagnosis not present

## 2012-10-27 DIAGNOSIS — M25569 Pain in unspecified knee: Secondary | ICD-10-CM | POA: Diagnosis not present

## 2012-10-27 DIAGNOSIS — M6281 Muscle weakness (generalized): Secondary | ICD-10-CM | POA: Diagnosis not present

## 2012-10-27 DIAGNOSIS — M25669 Stiffness of unspecified knee, not elsewhere classified: Secondary | ICD-10-CM | POA: Diagnosis not present

## 2012-10-27 NOTE — Telephone Encounter (Signed)
Okay to have 1 glass of wine a day if he wishes This is not generally recommended but we will monitor his protime carefully

## 2012-10-27 NOTE — Telephone Encounter (Signed)
Pt's wife called;pt has been taking Warfarin for 2 weeks.  Pt would like to have glass of red wine tonight. Is it OK for pt to drink wine with Warfarin.Please advise.

## 2012-10-28 NOTE — Telephone Encounter (Signed)
Spoke with patient and advised results   

## 2012-10-29 DIAGNOSIS — Z96659 Presence of unspecified artificial knee joint: Secondary | ICD-10-CM | POA: Diagnosis not present

## 2012-10-29 DIAGNOSIS — M25569 Pain in unspecified knee: Secondary | ICD-10-CM | POA: Diagnosis not present

## 2012-10-29 DIAGNOSIS — M25669 Stiffness of unspecified knee, not elsewhere classified: Secondary | ICD-10-CM | POA: Diagnosis not present

## 2012-10-29 DIAGNOSIS — M6281 Muscle weakness (generalized): Secondary | ICD-10-CM | POA: Diagnosis not present

## 2012-11-03 DIAGNOSIS — M6281 Muscle weakness (generalized): Secondary | ICD-10-CM | POA: Diagnosis not present

## 2012-11-03 DIAGNOSIS — M25669 Stiffness of unspecified knee, not elsewhere classified: Secondary | ICD-10-CM | POA: Diagnosis not present

## 2012-11-03 DIAGNOSIS — M25569 Pain in unspecified knee: Secondary | ICD-10-CM | POA: Diagnosis not present

## 2012-11-03 DIAGNOSIS — Z96659 Presence of unspecified artificial knee joint: Secondary | ICD-10-CM | POA: Diagnosis not present

## 2012-11-06 ENCOUNTER — Ambulatory Visit: Payer: Medicare Other

## 2012-11-06 ENCOUNTER — Ambulatory Visit (INDEPENDENT_AMBULATORY_CARE_PROVIDER_SITE_OTHER): Payer: Medicare Other | Admitting: General Practice

## 2012-11-06 DIAGNOSIS — I2699 Other pulmonary embolism without acute cor pulmonale: Secondary | ICD-10-CM | POA: Diagnosis not present

## 2012-11-06 DIAGNOSIS — Z7901 Long term (current) use of anticoagulants: Secondary | ICD-10-CM

## 2012-11-06 DIAGNOSIS — Z96659 Presence of unspecified artificial knee joint: Secondary | ICD-10-CM | POA: Diagnosis not present

## 2012-11-06 DIAGNOSIS — G8918 Other acute postprocedural pain: Secondary | ICD-10-CM

## 2012-11-06 DIAGNOSIS — R269 Unspecified abnormalities of gait and mobility: Secondary | ICD-10-CM

## 2012-11-06 DIAGNOSIS — Z471 Aftercare following joint replacement surgery: Secondary | ICD-10-CM

## 2012-11-06 DIAGNOSIS — M6281 Muscle weakness (generalized): Secondary | ICD-10-CM

## 2012-11-06 DIAGNOSIS — M25569 Pain in unspecified knee: Secondary | ICD-10-CM | POA: Diagnosis not present

## 2012-11-06 DIAGNOSIS — M25669 Stiffness of unspecified knee, not elsewhere classified: Secondary | ICD-10-CM | POA: Diagnosis not present

## 2012-11-10 DIAGNOSIS — M25569 Pain in unspecified knee: Secondary | ICD-10-CM | POA: Diagnosis not present

## 2012-11-10 DIAGNOSIS — M6281 Muscle weakness (generalized): Secondary | ICD-10-CM | POA: Diagnosis not present

## 2012-11-10 DIAGNOSIS — M25669 Stiffness of unspecified knee, not elsewhere classified: Secondary | ICD-10-CM | POA: Diagnosis not present

## 2012-11-10 DIAGNOSIS — Z96659 Presence of unspecified artificial knee joint: Secondary | ICD-10-CM | POA: Diagnosis not present

## 2012-11-13 DIAGNOSIS — Z96659 Presence of unspecified artificial knee joint: Secondary | ICD-10-CM | POA: Diagnosis not present

## 2012-11-13 DIAGNOSIS — M25669 Stiffness of unspecified knee, not elsewhere classified: Secondary | ICD-10-CM | POA: Diagnosis not present

## 2012-11-13 DIAGNOSIS — M6281 Muscle weakness (generalized): Secondary | ICD-10-CM | POA: Diagnosis not present

## 2012-11-13 DIAGNOSIS — M25569 Pain in unspecified knee: Secondary | ICD-10-CM | POA: Diagnosis not present

## 2012-11-17 ENCOUNTER — Encounter: Payer: Self-pay | Admitting: Internal Medicine

## 2012-11-17 ENCOUNTER — Ambulatory Visit (INDEPENDENT_AMBULATORY_CARE_PROVIDER_SITE_OTHER): Payer: Medicare Other | Admitting: Internal Medicine

## 2012-11-17 VITALS — BP 118/68 | HR 77 | Temp 98.4°F | Wt 183.0 lb

## 2012-11-17 DIAGNOSIS — E785 Hyperlipidemia, unspecified: Secondary | ICD-10-CM | POA: Diagnosis not present

## 2012-11-17 DIAGNOSIS — Z96659 Presence of unspecified artificial knee joint: Secondary | ICD-10-CM | POA: Diagnosis not present

## 2012-11-17 DIAGNOSIS — I2699 Other pulmonary embolism without acute cor pulmonale: Secondary | ICD-10-CM

## 2012-11-17 DIAGNOSIS — F411 Generalized anxiety disorder: Secondary | ICD-10-CM | POA: Diagnosis not present

## 2012-11-17 DIAGNOSIS — M1710 Unilateral primary osteoarthritis, unspecified knee: Secondary | ICD-10-CM

## 2012-11-17 DIAGNOSIS — M25669 Stiffness of unspecified knee, not elsewhere classified: Secondary | ICD-10-CM | POA: Diagnosis not present

## 2012-11-17 DIAGNOSIS — C61 Malignant neoplasm of prostate: Secondary | ICD-10-CM

## 2012-11-17 DIAGNOSIS — F419 Anxiety disorder, unspecified: Secondary | ICD-10-CM

## 2012-11-17 DIAGNOSIS — M25569 Pain in unspecified knee: Secondary | ICD-10-CM | POA: Diagnosis not present

## 2012-11-17 DIAGNOSIS — M6281 Muscle weakness (generalized): Secondary | ICD-10-CM | POA: Diagnosis not present

## 2012-11-17 LAB — CBC WITH DIFFERENTIAL/PLATELET
Basophils Relative: 0.3 % (ref 0.0–3.0)
Eosinophils Relative: 0.8 % (ref 0.0–5.0)
HCT: 41 % (ref 39.0–52.0)
Hemoglobin: 14 g/dL (ref 13.0–17.0)
Lymphs Abs: 2 10*3/uL (ref 0.7–4.0)
MCV: 93.7 fl (ref 78.0–100.0)
Monocytes Absolute: 0.9 10*3/uL (ref 0.1–1.0)
Monocytes Relative: 10 % (ref 3.0–12.0)
RBC: 4.38 Mil/uL (ref 4.22–5.81)
WBC: 8.6 10*3/uL (ref 4.5–10.5)

## 2012-11-17 LAB — LIPID PANEL
HDL: 37.2 mg/dL — ABNORMAL LOW (ref >39.00)
Total CHOL/HDL Ratio: 5
VLDL: 47.2 mg/dL — ABNORMAL HIGH (ref 0.0–40.0)

## 2012-11-17 LAB — HEPATIC FUNCTION PANEL
ALT: 23 U/L (ref 0–53)
AST: 20 U/L (ref 0–37)
Albumin: 3.7 g/dL (ref 3.5–5.2)
Alkaline Phosphatase: 75 U/L (ref 39–117)
Total Bilirubin: 0.8 mg/dL (ref 0.3–1.2)

## 2012-11-17 LAB — BASIC METABOLIC PANEL
Chloride: 100 mEq/L (ref 96–112)
GFR: 71.12 mL/min (ref >60.00)
Potassium: 4.3 mEq/L (ref 3.5–5.1)
Sodium: 138 mEq/L (ref 135–145)

## 2012-11-17 LAB — TSH: TSH: 1.29 u[IU]/mL (ref 0.35–5.50)

## 2012-11-17 LAB — PSA: PSA: 0 ng/mL — ABNORMAL LOW (ref 0.10–4.00)

## 2012-11-17 LAB — LDL CHOLESTEROL, DIRECT: Direct LDL: 116.8 mg/dL

## 2012-11-17 NOTE — Assessment & Plan Note (Signed)
Improving now but slow May need regular ibuprofen at night---has trouble getting comfortable

## 2012-11-17 NOTE — Progress Notes (Signed)
Subjective:    Patient ID: Juan Horn, male    DOB: 09-16-1933, 78 y.o.   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 Cholecalciferol (VITAMIN D-3) 1000 UNITS CAPS Take by mouth 2 (two) times daily.      . citalopram (CELEXA) 10 MG tablet Take 1 tablet (10 mg total) by mouth daily.  90 tablet  3  . Multiple Vitamins-Minerals (PRESERVISION AREDS 2 PO) Take 2 tablets by mouth 2 (two) times daily.      . pravastatin (PRAVACHOL) 20 MG tablet Take 1 tablet (20 mg total) by mouth daily.  90 tablet  3  . warfarin (COUMADIN) 6 MG tablet Take 1 tablet (6 mg total) by mouth daily.  90 tablet  0    Allergies  Allergen Reactions  . Naphazoline-Polyethyl Glycol Other (See Comments)    (Afgan) redness    Past Medical History  Diagnosis Date  . Personal history of prostate cancer   . Hx of colonic polyp   . Diverticulosis of colon   . HLD (hyperlipidemia)   . Glaucoma(365)   . Macular degeneration     legally blind in right eye  . Spinal stenosis of lumbar region   . OA (osteoarthritis)   . Anxiety   . Pulmonary embolism 11/13    post op TKR    Past Surgical History  Procedure Date  . Appendectomy   . Inguinal hernia repair     left  . Prostatectomy   . Tonsillectomy   . Varicose vein surgery     left  . Inguinal hernia repair 5/12    Dr Beverly Sessions  . Inguinal hernia repair 5/12    Dr Evette Cristal did  redo of this  . Joint replacement 11/13    Left total knee--Dr Hooten    Family History  Problem Relation Age of Onset  . Stroke Father   . Dementia Mother   . Leukemia Sister     History   Social History  . Marital Status: Married    Spouse Name: N/A    Number of Children: 0  . Years of Education: N/A   Occupational History  . 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 Smoking status: Former Smoker    Quit date: 12/03/1978  . Smokeless tobacco: Never Used  . Alcohol Use: Yes     Comment: Wine-several times per week  . Drug Use: Not on file  . Sexually Active: Not on file   Other Topics Concern  . Not on file   Social History Narrative   Has living will DNR done 11/12Wife is health care POA.No feeding tube   Review of Systems Sleeping okay now---no recent flashbacks Appetite is okay Weight is stable     Objective:   Physical Exam  Constitutional: He appears well-developed and well-nourished. No distress.  Neck: Normal range of motion. Neck  supple. No thyromegaly present.  Cardiovascular: Normal rate, regular rhythm and normal heart sounds.  Exam reveals no gallop.   No murmur heard. Pulmonary/Chest: Effort normal and breath sounds normal. No respiratory distress. He has no wheezes. He has no rales.  Musculoskeletal: He exhibits no edema and no tenderness.       No calf tenderness  Lymphadenopathy:    He has no cervical adenopathy.  Psychiatric:       Mild anxiety          Assessment & Plan:

## 2012-11-17 NOTE — Assessment & Plan Note (Signed)
No obvious symptoms now Therapeutic with coumadin Will continue another 2 months

## 2012-11-17 NOTE — Assessment & Plan Note (Signed)
Some degree of PTSD from extended time of dyspnea and then the PE diagnosis post op Seems to be better now Some reactive depression now while recuperating  Continue the citalopram

## 2012-11-17 NOTE — Assessment & Plan Note (Signed)
No problems on the med

## 2012-11-20 DIAGNOSIS — M25569 Pain in unspecified knee: Secondary | ICD-10-CM | POA: Diagnosis not present

## 2012-11-20 DIAGNOSIS — Z96659 Presence of unspecified artificial knee joint: Secondary | ICD-10-CM | POA: Diagnosis not present

## 2012-11-20 DIAGNOSIS — M25669 Stiffness of unspecified knee, not elsewhere classified: Secondary | ICD-10-CM | POA: Diagnosis not present

## 2012-11-20 DIAGNOSIS — M6281 Muscle weakness (generalized): Secondary | ICD-10-CM | POA: Diagnosis not present

## 2012-11-21 ENCOUNTER — Telehealth: Payer: Self-pay | Admitting: *Deleted

## 2012-11-21 MED ORDER — ALPRAZOLAM 0.25 MG PO TABS: 0.2500 mg | ORAL_TABLET | Freq: Three times a day (TID) | ORAL | Status: DC | PRN

## 2012-11-21 NOTE — Telephone Encounter (Signed)
rx called into pharmacy Spoke with patient and advised results appt scheduled for monday

## 2012-11-21 NOTE — Telephone Encounter (Signed)
Wife calling stating patient is "all to pieces" pt was seen on Monday and wife thinks pt needs to be seen again. Mrs. Choi said patient is upset and crying and having an emotional meltdown. They use [facility]. Her phone [phone].

## 2012-11-21 NOTE — Telephone Encounter (Signed)
Please have them increase the citalopram to 20mg  daily Send Rx for alprazolam 0.25. 1 tab tid prn for anxiety. #40 x 0 Schedule appt for Monday or Tuesday to review (as long as she thinks he can wait till then with the increased meds) Let them know about Saturday clinic if the alprazolam doesn't give him some quick relief

## 2012-11-24 ENCOUNTER — Encounter: Payer: Self-pay | Admitting: Internal Medicine

## 2012-11-24 ENCOUNTER — Ambulatory Visit (INDEPENDENT_AMBULATORY_CARE_PROVIDER_SITE_OTHER): Payer: Medicare Other | Admitting: Internal Medicine

## 2012-11-24 VITALS — BP 110/60 | HR 99 | Temp 97.5°F | Wt 181.0 lb

## 2012-11-24 DIAGNOSIS — F419 Anxiety disorder, unspecified: Secondary | ICD-10-CM

## 2012-11-24 DIAGNOSIS — Z96659 Presence of unspecified artificial knee joint: Secondary | ICD-10-CM | POA: Diagnosis not present

## 2012-11-24 DIAGNOSIS — M6281 Muscle weakness (generalized): Secondary | ICD-10-CM | POA: Diagnosis not present

## 2012-11-24 DIAGNOSIS — M25669 Stiffness of unspecified knee, not elsewhere classified: Secondary | ICD-10-CM | POA: Diagnosis not present

## 2012-11-24 DIAGNOSIS — F411 Generalized anxiety disorder: Secondary | ICD-10-CM

## 2012-11-24 DIAGNOSIS — M25569 Pain in unspecified knee: Secondary | ICD-10-CM | POA: Diagnosis not present

## 2012-11-24 MED ORDER — CITALOPRAM HYDROBROMIDE 20 MG PO TABS: 20.0000 mg | ORAL_TABLET | Freq: Every day | ORAL | Status: DC

## 2012-11-24 NOTE — Patient Instructions (Signed)
Continue to take the citalopram at 20mg  daily. I sent a new prescription for this dose to [facility]. Call if you need more of the nerve pill, alprazolam

## 2012-11-24 NOTE — Assessment & Plan Note (Signed)
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

## 2012-11-24 NOTE — Progress Notes (Signed)
Subjective:    Patient ID: Juan Horn, male    DOB: 04/12/1933, 78 y.o.   MRN: [id number]  HPI Here alone--wife had another appt  Since last appt, knew he was feeling some depression Anxiety spells --- and had terrible crying spell just with some little problem with computer Tried alprazolam and that helped Did use last night and this morning as prn and it has helped  Not anhedonic Enjoys reading again, being with wife though not really TV again Feels he is not "as involved in things" as he likes Appetite is fair--weight down slightly Sleeping okay Hasn't been thinking about death  Current Outpatient Prescriptions on File Prior to Visit  Medication Sig Dispense Refill  . ALPRAZolam (XANAX) 0.25 MG tablet Take 1 tablet (0.25 mg total) by mouth 3 (three) times daily as needed.  40 tablet  0  . Cholecalciferol (VITAMIN D-3) 1000 UNITS CAPS Take by mouth 2 (two) times daily.      . citalopram (CELEXA) 10 MG tablet Take 20 mg by mouth daily.      . Multiple Vitamins-Minerals (PRESERVISION AREDS 2 PO) Take 2 tablets by mouth 2 (two) times daily.      . pravastatin (PRAVACHOL) 20 MG tablet Take 1 tablet (20 mg total) by mouth daily.  90 tablet  3  . warfarin (COUMADIN) 6 MG tablet Take 1 tablet (6 mg total) by mouth daily.  90 tablet  0    Allergies  Allergen Reactions  . Naphazoline-Polyethyl Glycol Other (See Comments)    (Afgan) redness    Past Medical History  Diagnosis Date  . Personal history of prostate cancer   . Hx of colonic polyp   . Diverticulosis of colon   . HLD (hyperlipidemia)   . Glaucoma(365)   . Macular degeneration     legally blind in right eye  . Spinal stenosis of lumbar region   . OA (osteoarthritis)   . Anxiety   . Pulmonary embolism 11/13    post op TKR    Past Surgical History  Procedure Date  . Appendectomy   . Inguinal hernia repair     left  . Prostatectomy   . Tonsillectomy   . Varicose vein surgery     left  . Inguinal hernia  repair 5/12    Dr Beverly Sessions  . Inguinal hernia repair 5/12    Dr Evette Cristal did redo of this  . Joint replacement 11/13    Left total knee--Dr Hooten    Family History  Problem Relation Age of Onset  . Stroke Father   . Dementia Mother   . Leukemia Sister     History   Social History  . Marital Status: Married    Spouse Name: N/A    Number of Children: 0  . Years of Education: N/A   Occupational History  . 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 Smoking status: Former Smoker    Quit date: 12/03/1978  . Smokeless tobacco: Never Used  . Alcohol Use: Yes     Comment: Wine-several times per week  . Drug Use: Not on file  . Sexually Active: Not on file   Other Topics Concern  . Not on file   Social History Narrative   Has living will DNR done 11/12Wife is health care POA.No feeding tube   Review of Systems Has ortho follow up tomorrow Hopes to start being driver for [employer]    Objective:  Physical Exam  Constitutional: He appears well-developed and well-nourished. No distress.  Psychiatric: He has a normal mood and affect. His behavior is normal. Thought content normal.          Assessment & Plan:

## 2012-11-25 DIAGNOSIS — Z96659 Presence of unspecified artificial knee joint: Secondary | ICD-10-CM | POA: Diagnosis not present

## 2012-11-27 ENCOUNTER — Ambulatory Visit (INDEPENDENT_AMBULATORY_CARE_PROVIDER_SITE_OTHER): Payer: Medicare Other | Admitting: General Practice

## 2012-11-27 DIAGNOSIS — Z5181 Encounter for therapeutic drug level monitoring: Secondary | ICD-10-CM

## 2012-11-27 DIAGNOSIS — I2699 Other pulmonary embolism without acute cor pulmonale: Secondary | ICD-10-CM

## 2012-11-27 DIAGNOSIS — Z7901 Long term (current) use of anticoagulants: Secondary | ICD-10-CM

## 2012-11-27 NOTE — Patient Instructions (Signed)
Skip coumadin today and tomorrow (Thursday and Friday) and then change dosage to 6 mg all days except 3 mg on Sunday and Thursday. Re-check in 2 weeks.

## 2012-12-11 ENCOUNTER — Ambulatory Visit (INDEPENDENT_AMBULATORY_CARE_PROVIDER_SITE_OTHER): Payer: Medicare Other | Admitting: General Practice

## 2012-12-11 DIAGNOSIS — Z5181 Encounter for therapeutic drug level monitoring: Secondary | ICD-10-CM | POA: Diagnosis not present

## 2012-12-11 DIAGNOSIS — I2699 Other pulmonary embolism without acute cor pulmonale: Secondary | ICD-10-CM | POA: Diagnosis not present

## 2012-12-11 DIAGNOSIS — Z7901 Long term (current) use of anticoagulants: Secondary | ICD-10-CM | POA: Diagnosis not present

## 2012-12-11 NOTE — Patient Instructions (Signed)
Take 6  mg all days except 3 mg on Sunday and Thursday. Re-check in 4 weeks.    

## 2012-12-25 ENCOUNTER — Encounter: Payer: Self-pay | Admitting: Internal Medicine

## 2012-12-25 ENCOUNTER — Ambulatory Visit (INDEPENDENT_AMBULATORY_CARE_PROVIDER_SITE_OTHER): Payer: Medicare Other | Admitting: Internal Medicine

## 2012-12-25 VITALS — BP 128/80 | HR 91 | Temp 98.1°F | Wt 180.0 lb

## 2012-12-25 DIAGNOSIS — F411 Generalized anxiety disorder: Secondary | ICD-10-CM | POA: Diagnosis not present

## 2012-12-25 DIAGNOSIS — N63 Unspecified lump in unspecified breast: Secondary | ICD-10-CM | POA: Diagnosis not present

## 2012-12-25 DIAGNOSIS — F419 Anxiety disorder, unspecified: Secondary | ICD-10-CM

## 2012-12-25 DIAGNOSIS — I2699 Other pulmonary embolism without acute cor pulmonale: Secondary | ICD-10-CM | POA: Diagnosis not present

## 2012-12-25 MED ORDER — PRAVASTATIN SODIUM 20 MG PO TABS: 20.0000 mg | ORAL_TABLET | Freq: Every day | ORAL | Status: DC

## 2012-12-25 NOTE — Assessment & Plan Note (Signed)
Seems cystic Some bruising under it---?hematoma Unlikely cancer but needs reeval Will check in 1 month---mammogram if not reassuring exam

## 2012-12-25 NOTE — Assessment & Plan Note (Signed)
~  2 months ago Will reevaluate next month and stop the coumadin if he is doing well

## 2012-12-25 NOTE — Progress Notes (Signed)
Subjective:    Patient ID: Juan Horn, male    DOB: 1933/04/21, 79 y.o.   MRN: [id number]  HPI Nerves have settled down Still gets upset easy at times---like when something doesn't work right with the computer  Did have dizzy spell walking around his car 3 days ago (had driven downtown) [place] on its own when he slowed down No chest pain No SOB  Has some bruising on right breast Noticed lump 3 days ago Doesn't remember any trauma No cough  Current Outpatient Prescriptions on File Prior to Visit  Medication Sig Dispense Refill  . ALPRAZolam (XANAX) 0.25 MG tablet Take 1 tablet (0.25 mg total) by mouth 3 (three) times daily as needed.  40 tablet  0  . Cholecalciferol (VITAMIN D-3) 1000 UNITS CAPS Take by mouth 2 (two) times daily.      . citalopram (CELEXA) 20 MG tablet Take 1 tablet (20 mg total) by mouth daily.  90 tablet  3  . Multiple Vitamins-Minerals (PRESERVISION AREDS 2 PO) Take 2 tablets by mouth 2 (two) times daily.      . pravastatin (PRAVACHOL) 20 MG tablet Take 1 tablet (20 mg total) by mouth daily.  90 tablet  3  . warfarin (COUMADIN) 6 MG tablet Take 1 tablet (6 mg total) by mouth daily.  90 tablet  0    Allergies  Allergen Reactions  . Naphazoline-Polyethyl Glycol Other (See Comments)    (Afgan) redness    Past Medical History  Diagnosis Date  . Personal history of prostate cancer   . Hx of colonic polyp   . Diverticulosis of colon   . HLD (hyperlipidemia)   . Glaucoma(365)   . Macular degeneration     legally blind in right eye  . Spinal stenosis of lumbar region   . OA (osteoarthritis)   . Anxiety   . Pulmonary embolism 11/13    post op TKR    Past Surgical History  Procedure Date  . Appendectomy   . Inguinal hernia repair     left  . Prostatectomy   . Tonsillectomy   . Varicose vein surgery     left  . Inguinal hernia repair 5/12    Dr Beverly Sessions  . Inguinal hernia repair 5/12    Dr Evette Cristal did redo of this  . Joint replacement  11/13    Left total knee--Dr Hooten    Family History  Problem Relation Age of Onset  . Stroke Father   . Dementia Mother   . Leukemia Sister     History   Social History  . Marital Status: Married    Spouse Name: N/A    Number of Children: 0  . Years of Education: N/A   Occupational History  . 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 Smoking status: Former Smoker    Quit date: 12/03/1978  . Smokeless tobacco: Never Used  . Alcohol Use: Yes     Comment: Wine-several times per week  . Drug Use: Not on file  . Sexually Active: Not on file   Other Topics Concern  . Not on file   Social History Narrative   Has living will DNR done 11/12Wife is health care POA.No feeding tube   Review of Systems Sleeps okay Appetite is good     Objective:   Physical Exam  Genitourinary:       Mild cystic changes around 1-2 o'clock in right breast No worrisome mass Mild bruising on  lower part of right breast  Lymphadenopathy:    He has no axillary adenopathy.  Psychiatric: He has a normal mood and affect. His behavior is normal.          Assessment & Plan:

## 2012-12-25 NOTE — Assessment & Plan Note (Signed)
Has improved Still has a short fuse--but dealing with frustration better No changes needed

## 2013-01-08 ENCOUNTER — Ambulatory Visit (INDEPENDENT_AMBULATORY_CARE_PROVIDER_SITE_OTHER): Payer: Medicare Other | Admitting: General Practice

## 2013-01-08 DIAGNOSIS — I2699 Other pulmonary embolism without acute cor pulmonale: Secondary | ICD-10-CM | POA: Diagnosis not present

## 2013-01-08 DIAGNOSIS — Z5181 Encounter for therapeutic drug level monitoring: Secondary | ICD-10-CM

## 2013-01-08 DIAGNOSIS — Z7901 Long term (current) use of anticoagulants: Secondary | ICD-10-CM

## 2013-01-08 LAB — POCT INR: INR: 2.6

## 2013-01-08 NOTE — Patient Instructions (Signed)
Take 6  mg all days except 3 mg on Sunday and Thursday. Re-check in 4 weeks.

## 2013-01-13 DIAGNOSIS — H353290 Exudative age-related macular degeneration, unspecified eye, stage unspecified: Secondary | ICD-10-CM | POA: Diagnosis not present

## 2013-01-20 ENCOUNTER — Ambulatory Visit: Payer: Medicare Other | Admitting: Internal Medicine

## 2013-01-27 ENCOUNTER — Ambulatory Visit (INDEPENDENT_AMBULATORY_CARE_PROVIDER_SITE_OTHER): Payer: Medicare Other | Admitting: General Practice

## 2013-01-27 ENCOUNTER — Encounter: Payer: Self-pay | Admitting: Internal Medicine

## 2013-01-27 ENCOUNTER — Ambulatory Visit (INDEPENDENT_AMBULATORY_CARE_PROVIDER_SITE_OTHER): Payer: Medicare Other | Admitting: Internal Medicine

## 2013-01-27 VITALS — BP 110/60 | HR 88 | Temp 97.5°F | Wt 181.0 lb

## 2013-01-27 DIAGNOSIS — I2699 Other pulmonary embolism without acute cor pulmonale: Secondary | ICD-10-CM

## 2013-01-27 DIAGNOSIS — N63 Unspecified lump in unspecified breast: Secondary | ICD-10-CM

## 2013-01-27 DIAGNOSIS — F419 Anxiety disorder, unspecified: Secondary | ICD-10-CM

## 2013-01-27 DIAGNOSIS — F411 Generalized anxiety disorder: Secondary | ICD-10-CM | POA: Diagnosis not present

## 2013-01-27 NOTE — Assessment & Plan Note (Signed)
3 months out from PE after joint replacement Doing well now Will stop the coumadin

## 2013-01-27 NOTE — Progress Notes (Signed)
Subjective:    Patient ID: Juan Horn, male    DOB: 11-Jul-1933, 79 y.o.   MRN: [id number]  HPI No leg swelling No chest pain No SOB Did notice DOE hauling boxes up stairs---doesn't think this was unexpected  Right breast lump is gone No discharge  Nerves are good Now on the citalopram 20mg  daily  Current Outpatient Prescriptions on File Prior to Visit  Medication Sig Dispense Refill  . ALPRAZolam (XANAX) 0.25 MG tablet Take 1 tablet (0.25 mg total) by mouth 3 (three) times daily as needed.  40 tablet  0  . Cholecalciferol (VITAMIN D-3) 1000 UNITS CAPS Take by mouth 2 (two) times daily.      . citalopram (CELEXA) 20 MG tablet Take 1 tablet (20 mg total) by mouth daily.  90 tablet  3  . Multiple Vitamins-Minerals (PRESERVISION AREDS 2 PO) Take 2 tablets by mouth 2 (two) times daily.      . pravastatin (PRAVACHOL) 20 MG tablet Take 1 tablet (20 mg total) by mouth daily.  90 tablet  3  . warfarin (COUMADIN) 6 MG tablet Take 1 tablet (6 mg total) by mouth daily.  90 tablet  0   No current facility-administered medications on file prior to visit.    Allergies  Allergen Reactions  . Naphazoline-Polyethyl Glycol Other (See Comments)    (Afgan) redness    Past Medical History  Diagnosis Date  . Personal history of prostate cancer   . Hx of colonic polyp   . Diverticulosis of colon   . HLD (hyperlipidemia)   . Glaucoma(365)   . Macular degeneration     legally blind in right eye  . Spinal stenosis of lumbar region   . OA (osteoarthritis)   . Anxiety   . Pulmonary embolism 11/13    post op TKR    Past Surgical History  Procedure Laterality Date  . Appendectomy    . Inguinal hernia repair      left  . Prostatectomy    . Tonsillectomy    . Varicose vein surgery      left  . Inguinal hernia repair  5/12    Dr Beverly Sessions  . Inguinal hernia repair  5/12    Dr Evette Cristal did redo of this  . Joint replacement  11/13    Left total knee--Dr Hooten    Family History   Problem Relation Age of Onset  . Stroke Father   . Dementia Mother   . Leukemia Sister     History   Social History  . Marital Status: Married    Spouse Name: N/A    Number of Children: 0  . Years of Education: N/A   Occupational History  . 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 Smoking status: Former Smoker    Quit date: 12/03/1978  . Smokeless tobacco: Never Used  . Alcohol Use: Yes     Comment: Wine-several times per week  . Drug Use: Not on file  . Sexually Active: Not on file   Other Topics Concern  . Not on file   Social History Narrative   Has living will    DNR done 11/12   Wife is health care POA.   No feeding tube   Review of Systems Appetite is good Weight is stable Sleeps well    Objective:   Physical Exam  Constitutional: He appears well-developed and well-nourished. No distress.  Neck: Normal range of motion. Neck supple. No thyromegaly  present.  Cardiovascular: Normal rate, regular rhythm and normal heart sounds.  Exam reveals no gallop.   No murmur heard. Pulmonary/Chest: Effort normal and breath sounds normal. No respiratory distress. He has no wheezes. He has no rales.  Genitourinary:  No breast masses  Musculoskeletal: He exhibits no edema and no tenderness.  No calf swelling or tenderness  Lymphadenopathy:    He has no cervical adenopathy.  Psychiatric: He has a normal mood and affect. His behavior is normal.          Assessment & Plan:

## 2013-01-27 NOTE — Patient Instructions (Addendum)
Bring your wife with you at the next visit. We will discuss whether to begin decreasing the citalopram. For now, continue the 20mg  daily dose. Stop the warfarin.

## 2013-01-27 NOTE — Patient Instructions (Signed)
Coumadin d/cd.

## 2013-01-27 NOTE — Assessment & Plan Note (Signed)
Gone now Must have been hematoma while on warfarin

## 2013-01-27 NOTE — Assessment & Plan Note (Signed)
Doing well on the med Will continue If wife agrees, can consider weaning at his follow up

## 2013-02-05 ENCOUNTER — Ambulatory Visit: Payer: Medicare Other

## 2013-02-12 DIAGNOSIS — H40009 Preglaucoma, unspecified, unspecified eye: Secondary | ICD-10-CM | POA: Diagnosis not present

## 2013-04-02 ENCOUNTER — Ambulatory Visit (INDEPENDENT_AMBULATORY_CARE_PROVIDER_SITE_OTHER): Payer: Medicare Other | Admitting: Internal Medicine

## 2013-04-02 ENCOUNTER — Encounter: Payer: Self-pay | Admitting: Internal Medicine

## 2013-04-02 VITALS — BP 110/60 | HR 73 | Temp 98.3°F | Ht 68.0 in | Wt 182.0 lb

## 2013-04-02 DIAGNOSIS — F411 Generalized anxiety disorder: Secondary | ICD-10-CM | POA: Diagnosis not present

## 2013-04-02 DIAGNOSIS — Z860100 Personal history of colon polyps, unspecified: Secondary | ICD-10-CM

## 2013-04-02 DIAGNOSIS — I872 Venous insufficiency (chronic) (peripheral): Secondary | ICD-10-CM | POA: Diagnosis not present

## 2013-04-02 DIAGNOSIS — Z0000 Encounter for general adult medical examination without abnormal findings: Secondary | ICD-10-CM | POA: Diagnosis not present

## 2013-04-02 DIAGNOSIS — E785 Hyperlipidemia, unspecified: Secondary | ICD-10-CM

## 2013-04-02 DIAGNOSIS — F419 Anxiety disorder, unspecified: Secondary | ICD-10-CM

## 2013-04-02 NOTE — Assessment & Plan Note (Signed)
Doing better Will try to wean the citalopram

## 2013-04-02 NOTE — Assessment & Plan Note (Signed)
Superficial on left Operated on right Post op DVT has resolved

## 2013-04-02 NOTE — Assessment & Plan Note (Signed)
Wants to continue primary prevention Last LDL 116 on the med

## 2013-04-02 NOTE — Progress Notes (Signed)
Subjective:    Patient ID: Juan Horn, male    DOB: August 02, 1933, 79 y.o.   MRN: [id number]  HPI Here for Medicare wellness visit and follow up Former smoker 1 drink most days Has eye doctor, Dr Ernest Pine from his surgery and dentist No falls No anhedonia or depression Goes to fitness center twice a week at [facility] Independent with ADLs and instrumental ADLs Has weekly housekeeper now ---due to wife's back issues Hearing is okay---mild problems per wife Vision is okay in left eye---right is poor Hasn't noted any major memory issues  Anxiety has been fine Doesn't remember anxiety issues until a couple of years ago Multiple stressors that seem better Interested in decreasing dose Hasn't needed the alprazolam recently  Still on statin No GI problems or myalgias  Okay with continuing this  Has history of colon polyps Was told he should have 3 year recall He is not excited about having another one Will delay for now Bowels are fine No blood in stool  Prostate cancer 2010 Recent PSA was 0 Voids okay No incontinence  No chest pain No SOB No leg swelling  Current Outpatient Prescriptions on File Prior to Visit  Medication Sig Dispense Refill  . ALPRAZolam (XANAX) 0.25 MG tablet Take 1 tablet (0.25 mg total) by mouth 3 (three) times daily as needed.  40 tablet  0  . Cholecalciferol (VITAMIN D-3) 1000 UNITS CAPS Take by mouth 2 (two) times daily.      . citalopram (CELEXA) 20 MG tablet Take 1 tablet (20 mg total) by mouth daily.  90 tablet  3  . Multiple Vitamins-Minerals (PRESERVISION AREDS 2 PO) Take 2 tablets by mouth 2 (two) times daily.      . pravastatin (PRAVACHOL) 20 MG tablet Take 1 tablet (20 mg total) by mouth daily.  90 tablet  3   No current facility-administered medications on file prior to visit.    Allergies  Allergen Reactions  . Naphazoline-Polyethyl Glycol Other (See Comments)    (Afgan) redness    Past Medical History  Diagnosis Date  .  Personal history of prostate cancer   . Hx of colonic polyp   . Diverticulosis of colon   . HLD (hyperlipidemia)   . Glaucoma(365)   . Macular degeneration     legally blind in right eye  . Spinal stenosis of lumbar region   . OA (osteoarthritis)   . Anxiety   . Pulmonary embolism 11/13    post op TKR    Past Surgical History  Procedure Laterality Date  . Appendectomy    . Inguinal hernia repair      left  . Prostatectomy    . Tonsillectomy    . Varicose vein surgery      left  . Inguinal hernia repair  5/12    Dr Beverly Sessions  . Inguinal hernia repair  5/12    Dr Evette Cristal did redo of this  . Joint replacement  11/13    Left total knee--Dr Hooten    Family History  Problem Relation Age of Onset  . Stroke Father   . Dementia Mother   . Leukemia Sister     History   Social History  . Marital Status: Married    Spouse Name: N/A    Number of Children: 0  . Years of Education: N/A   Occupational History  . 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 Smoking status: Former Engineer, civil (consulting)  date: 12/03/1978  . Smokeless tobacco: Never Used  . Alcohol Use: Yes     Comment: Wine-several times per week  . Drug Use: Not on file  . Sexually Active: Not on file   Other Topics Concern  . Not on file   Social History Narrative   Has living will    DNR done 11/12   Wife is health care POA.   No feeding tube if cognitively unaware   Review of Systems Sleeps well Appetite is great Weight is stable     Objective:   Physical Exam  Constitutional: He is oriented to person, place, and time. He appears well-developed and well-nourished. No distress.  HENT:  Mouth/Throat: Oropharynx is clear and moist. No oropharyngeal exudate.  Neck: Normal range of motion. Neck supple. No thyromegaly present.  Cardiovascular: Normal rate, regular rhythm, normal heart sounds and intact distal pulses.  Exam reveals no gallop.   No murmur heard. occ skips   Pulmonary/Chest: Effort normal and breath sounds normal. No respiratory distress. He has no wheezes. He has no rales.  Abdominal: Soft. There is no tenderness.  Musculoskeletal: He exhibits no edema and no tenderness.  Marked superficial venous distention in left thigh and calf medially  Lymphadenopathy:    He has no cervical adenopathy.  Neurological: He is alert and oriented to person, place, and time.  President-- "Obama, Bush, Clinton" [phone] [id number] Recall 2/3  Skin: No rash noted.  Mycotic toenails No ulcers   Psychiatric: He has a normal mood and affect. His behavior is normal.          Assessment & Plan:

## 2013-04-02 NOTE — Patient Instructions (Signed)
Please decrease citalopram to 1/2 tab (10mg ) on odd days, and 20mg  on even days. If your anxiety is still quiet after 1 month (around June 1st), you can decrease to 10mg  every day

## 2013-04-02 NOTE — Assessment & Plan Note (Signed)
I have personally reviewed the Medicare Annual Wellness questionnaire and have noted 1. The patient's medical and social history 2. Their use of alcohol, tobacco or illicit drugs 3. Their current medications and supplements 4. The patient's functional ability including ADL's, fall risks, home safety risks and hearing or visual             impairment. 5. Diet and physical activities 6. Evidence for depression or mood disorders  The patients weight, height, BMI and visual acuity have been recorded in the chart I have made referrals, counseling and provided education to the patient based review of the above and I have provided the pt with a written personalized care plan for preventive services.  I have provided you with a copy of your personalized plan for preventive services. Please take the time to review along with your updated medication list.  Mild memory and hearing problems that don't appear to be serious Not interested in colonoscopy at this time

## 2013-04-02 NOTE — Assessment & Plan Note (Signed)
Will defer to 5 years If in good health in 2016, consider 1 last colonoscopy

## 2013-04-24 DIAGNOSIS — L03039 Cellulitis of unspecified toe: Secondary | ICD-10-CM | POA: Diagnosis not present

## 2013-04-24 DIAGNOSIS — M2040 Other hammer toe(s) (acquired), unspecified foot: Secondary | ICD-10-CM | POA: Diagnosis not present

## 2013-06-09 DIAGNOSIS — D1800 Hemangioma unspecified site: Secondary | ICD-10-CM | POA: Diagnosis not present

## 2013-06-09 DIAGNOSIS — L821 Other seborrheic keratosis: Secondary | ICD-10-CM | POA: Diagnosis not present

## 2013-06-09 DIAGNOSIS — D239 Other benign neoplasm of skin, unspecified: Secondary | ICD-10-CM | POA: Diagnosis not present

## 2013-06-09 DIAGNOSIS — L570 Actinic keratosis: Secondary | ICD-10-CM | POA: Diagnosis not present

## 2013-06-09 DIAGNOSIS — Z85828 Personal history of other malignant neoplasm of skin: Secondary | ICD-10-CM | POA: Diagnosis not present

## 2013-06-09 DIAGNOSIS — L578 Other skin changes due to chronic exposure to nonionizing radiation: Secondary | ICD-10-CM | POA: Diagnosis not present

## 2013-07-13 DIAGNOSIS — H353290 Exudative age-related macular degeneration, unspecified eye, stage unspecified: Secondary | ICD-10-CM | POA: Diagnosis not present

## 2013-07-18 DIAGNOSIS — R21 Rash and other nonspecific skin eruption: Secondary | ICD-10-CM | POA: Diagnosis not present

## 2013-07-20 DIAGNOSIS — L570 Actinic keratosis: Secondary | ICD-10-CM | POA: Diagnosis not present

## 2013-07-20 DIAGNOSIS — T148 Other injury of unspecified body region: Secondary | ICD-10-CM | POA: Diagnosis not present

## 2013-07-20 DIAGNOSIS — M779 Enthesopathy, unspecified: Secondary | ICD-10-CM | POA: Diagnosis not present

## 2013-07-27 ENCOUNTER — Ambulatory Visit: Payer: Medicare Other | Admitting: Internal Medicine

## 2013-07-27 ENCOUNTER — Ambulatory Visit (INDEPENDENT_AMBULATORY_CARE_PROVIDER_SITE_OTHER): Payer: Medicare Other | Admitting: Internal Medicine

## 2013-07-27 ENCOUNTER — Encounter: Payer: Self-pay | Admitting: Internal Medicine

## 2013-07-27 VITALS — BP 100/60 | HR 61 | Temp 97.8°F | Ht 68.0 in | Wt 188.0 lb

## 2013-07-27 DIAGNOSIS — M48061 Spinal stenosis, lumbar region without neurogenic claudication: Secondary | ICD-10-CM

## 2013-07-27 DIAGNOSIS — M1710 Unilateral primary osteoarthritis, unspecified knee: Secondary | ICD-10-CM | POA: Diagnosis not present

## 2013-07-27 DIAGNOSIS — F411 Generalized anxiety disorder: Secondary | ICD-10-CM | POA: Diagnosis not present

## 2013-07-27 DIAGNOSIS — F419 Anxiety disorder, unspecified: Secondary | ICD-10-CM

## 2013-07-27 DIAGNOSIS — E785 Hyperlipidemia, unspecified: Secondary | ICD-10-CM | POA: Diagnosis not present

## 2013-07-27 NOTE — Assessment & Plan Note (Signed)
Better Limited situational problems now Will try to wean down the citalopram and stop

## 2013-07-27 NOTE — Assessment & Plan Note (Signed)
Does okay with occ OTC med

## 2013-07-27 NOTE — Patient Instructions (Signed)
Please take the citalopram 10mg  6 days per week this week, 5 days per week next week, 4 days per week the following week, etc----till you are off. If your anxiety flares back up, restart 10mg  daily

## 2013-07-27 NOTE — Assessment & Plan Note (Signed)
Limited exercise tolerance due to this but not worsening

## 2013-07-27 NOTE — Assessment & Plan Note (Signed)
Fine with the statin for primary prevention

## 2013-07-27 NOTE — Progress Notes (Signed)
Subjective:    Patient ID: Juan Horn, male    DOB: 17-Apr-1933, 79 y.o.   MRN: [id number]  HPI Doing well No new concerns  Did decrease the citalopram to 10mg  daily Hasn't needed the alprazolam in a long time Mood is good Interested in weaning off  Ongoing back pain If he walks for awhile, gets back and hip pain Improves with rest Pain during golf---better with aleve before  Mild left knee pain Continues under Dr [employer] care  Still on cholesterol med No muscle aching Still okay with this  Current Outpatient Prescriptions on File Prior to Visit  Medication Sig Dispense Refill  . ALPRAZolam (XANAX) 0.25 MG tablet Take 1 tablet (0.25 mg total) by mouth 3 (three) times daily as needed.  40 tablet  0  . Cholecalciferol (VITAMIN D-3) 1000 UNITS CAPS Take by mouth 2 (two) times daily.      . citalopram (CELEXA) 20 MG tablet Take 10 mg by mouth daily.      . Multiple Vitamins-Minerals (PRESERVISION AREDS 2 PO) Take 2 tablets by mouth 2 (two) times daily.      . pravastatin (PRAVACHOL) 20 MG tablet Take 1 tablet (20 mg total) by mouth daily.  90 tablet  3   No current facility-administered medications on file prior to visit.    Allergies  Allergen Reactions  . Naphazoline-Polyethyl Glycol Other (See Comments)    (Afgan) redness    Past Medical History  Diagnosis Date  . Personal history of prostate cancer   . Hx of colonic polyp   . Diverticulosis of colon   . HLD (hyperlipidemia)   . Glaucoma   . Macular degeneration     legally blind in right eye  . Spinal stenosis of lumbar region   . OA (osteoarthritis)   . Anxiety   . Pulmonary embolism 11/13    post op TKR  . Chronic venous insufficiency     Past Surgical History  Procedure Laterality Date  . Appendectomy    . Inguinal hernia repair      left  . Prostatectomy    . Tonsillectomy    . Varicose vein surgery      left  . Inguinal hernia repair  5/12    Dr Beverly Sessions  . Inguinal hernia repair   5/12    Dr Evette Cristal did redo of this  . Joint replacement  11/13    Left total knee--Dr Hooten    Family History  Problem Relation Age of Onset  . Stroke Father   . Dementia Mother   . Leukemia Sister     History   Social History  . Marital Status: Married    Spouse Name: N/A    Number of Children: 0  . Years of Education: N/A   Occupational History  . 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 Smoking status: Former Smoker    Quit date: 12/03/1978  . Smokeless tobacco: Never Used  . Alcohol Use: Yes     Comment: Wine-several times per week  . Drug Use: Not on file  . Sexual Activity: Not on file   Other Topics Concern  . Not on file   Social History Narrative   Has living will    DNR done 11/12   Wife is health care POA.   No feeding tube if cognitively unaware   Review of Systems Sleeps well Appetite is good---weight is up a few pounds Bowels are fine  Objective:   Physical Exam  Constitutional: He appears well-developed and well-nourished. No distress.  Neck: Normal range of motion. Neck supple. No thyromegaly present.  Cardiovascular: Normal rate, regular rhythm and normal heart sounds.  Exam reveals no gallop.   No murmur heard. Pulmonary/Chest: Effort normal and breath sounds normal. No respiratory distress. He has no wheezes.  Abdominal: Soft. There is no tenderness.  Musculoskeletal: He exhibits no edema.  Non tender superficial varicosities in left leg  Lymphadenopathy:    He has no cervical adenopathy.  Psychiatric: He has a normal mood and affect. His behavior is normal.          Assessment & Plan:

## 2013-08-04 DIAGNOSIS — H4010X0 Unspecified open-angle glaucoma, stage unspecified: Secondary | ICD-10-CM | POA: Diagnosis not present

## 2013-08-10 DIAGNOSIS — H4010X0 Unspecified open-angle glaucoma, stage unspecified: Secondary | ICD-10-CM | POA: Diagnosis not present

## 2013-09-10 DIAGNOSIS — Z23 Encounter for immunization: Secondary | ICD-10-CM | POA: Diagnosis not present

## 2013-09-11 DIAGNOSIS — H4010X0 Unspecified open-angle glaucoma, stage unspecified: Secondary | ICD-10-CM | POA: Diagnosis not present

## 2013-09-14 DIAGNOSIS — H4010X0 Unspecified open-angle glaucoma, stage unspecified: Secondary | ICD-10-CM | POA: Diagnosis not present

## 2013-10-08 ENCOUNTER — Other Ambulatory Visit: Payer: Self-pay

## 2013-10-22 DIAGNOSIS — Z96659 Presence of unspecified artificial knee joint: Secondary | ICD-10-CM | POA: Diagnosis not present

## 2013-12-23 IMAGING — CT CT CHEST-ABD-PELV W/ CM
1 of 3 series · 13 of 32 positions shown, 18 images · IV contrast (APPLIED)
Comparison: none

REASON FOR EXAM: (1) dyspnea, elevated D-dimer, abdominal distension and
pain, abnormal bowel gas
COMMENTS:

[Series 9: soft tissue · axial · 0.86mm/px · z∈[-975,-501]mm · 13 of 180 slices shown, 18 images]
[im 11/180  soft-tissue]
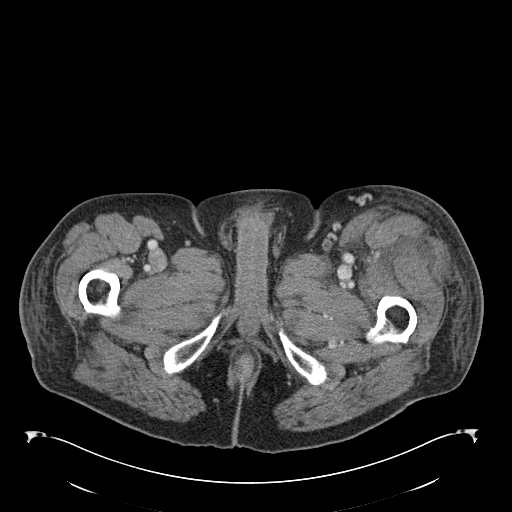
[im 11/180  bone]
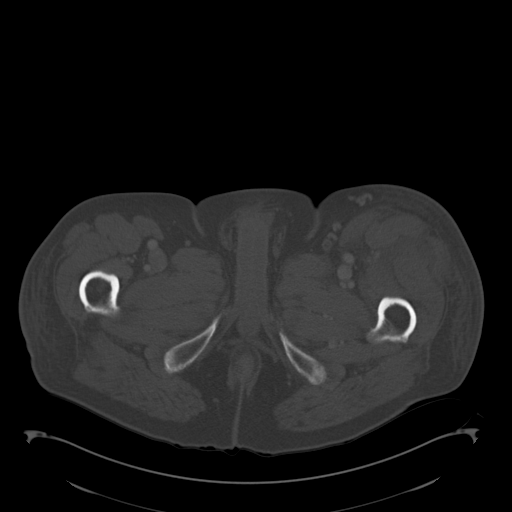
[im 32/180  soft-tissue]
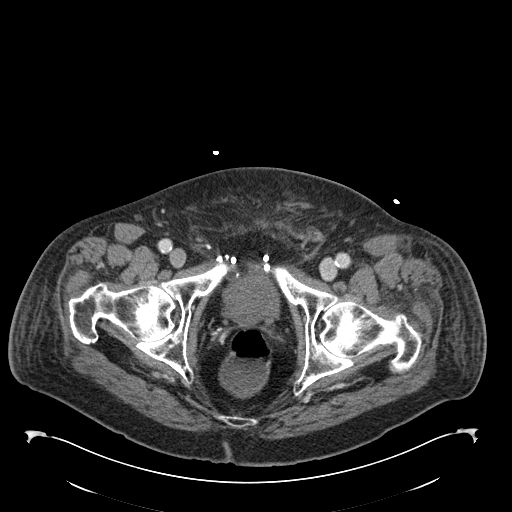
[im 43/180  soft-tissue]
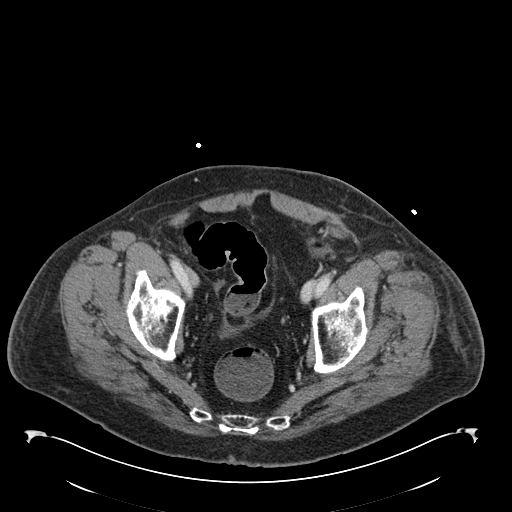
[im 53/180  soft-tissue]
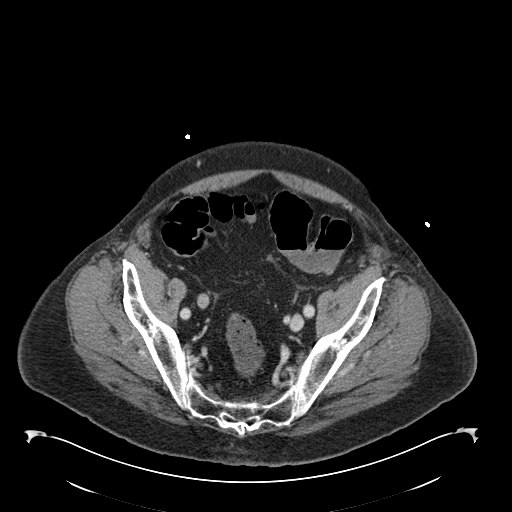
[im 74/180  soft-tissue]
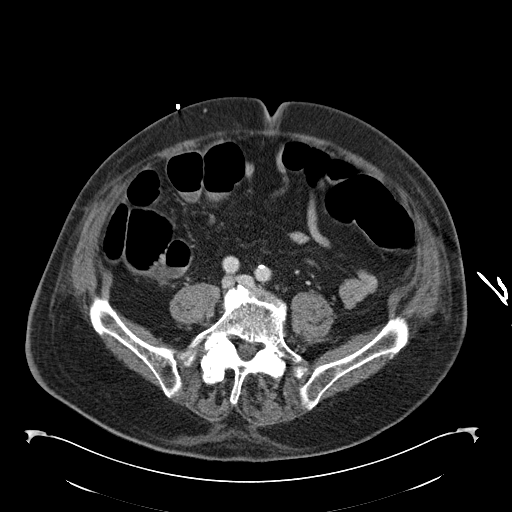
[im 85/180  soft-tissue]
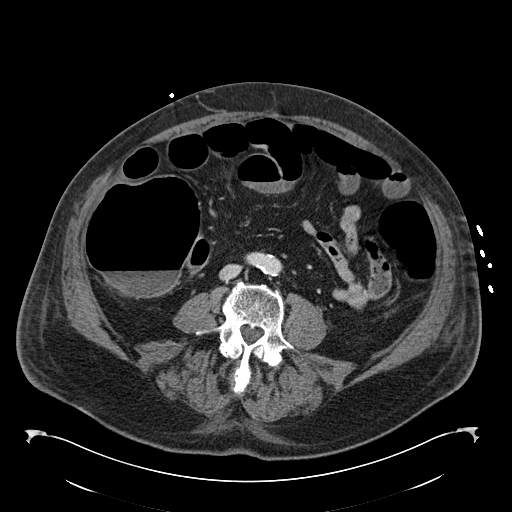
[im 95/180  soft-tissue]
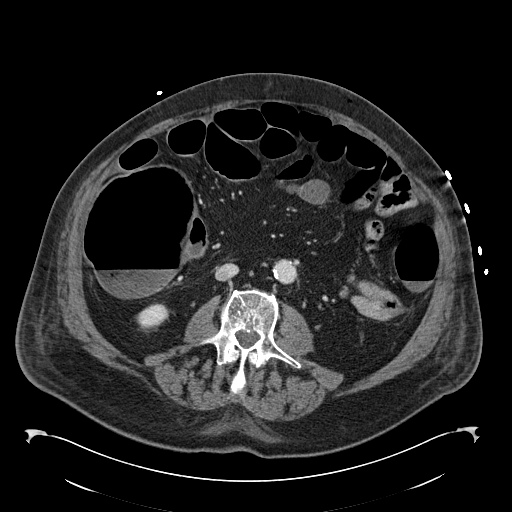
[im 116/180  soft-tissue]
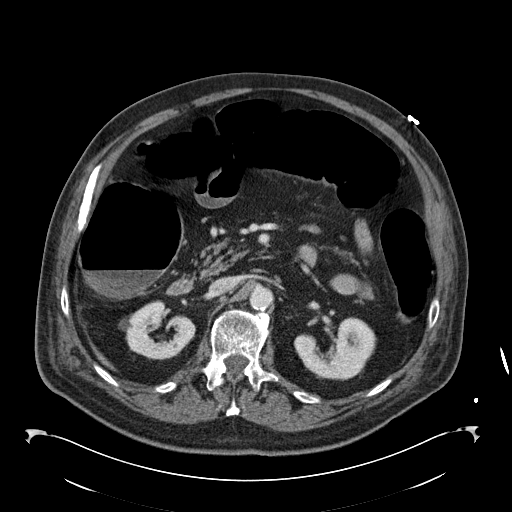
[im 127/180  soft-tissue]
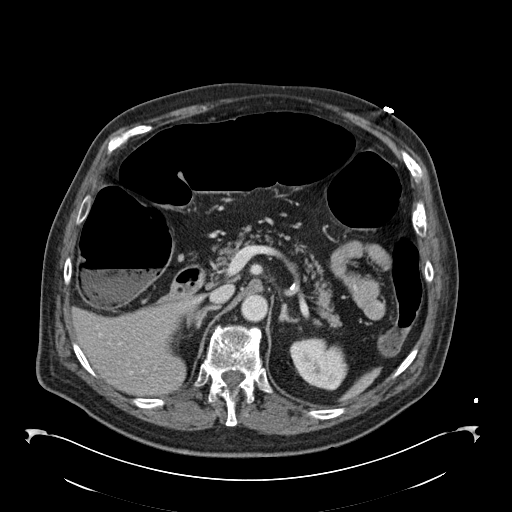
[im 127/180  bone]
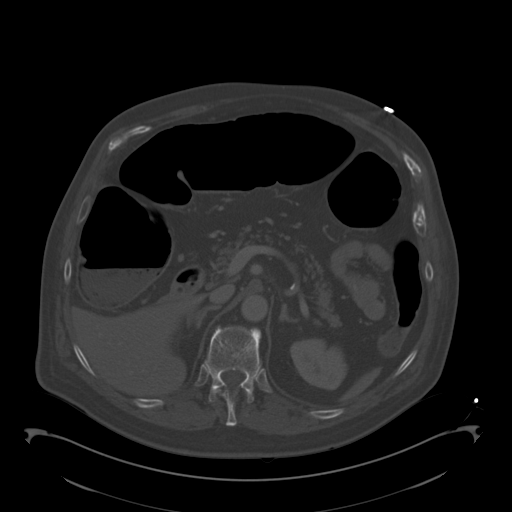
[im 137/180  soft-tissue]
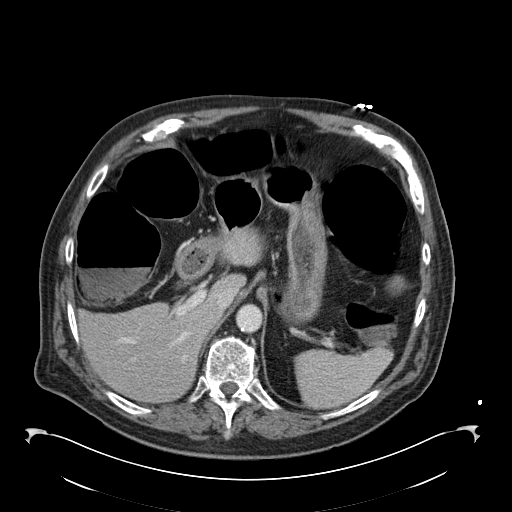
[im 137/180  lung]
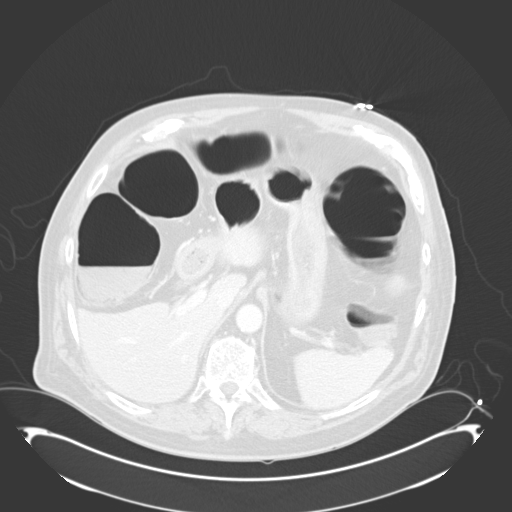
[im 148/180  lung]
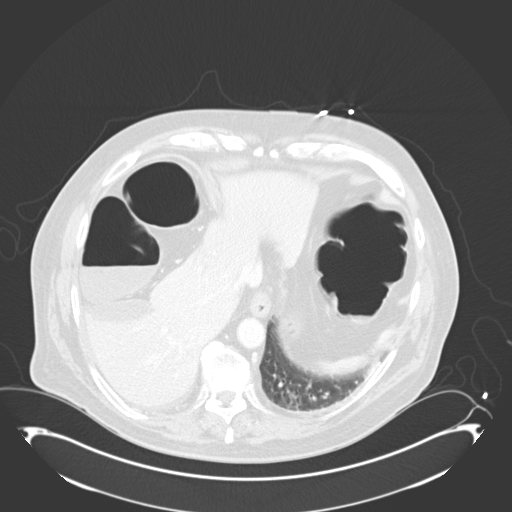
[im 158/180  soft-tissue]
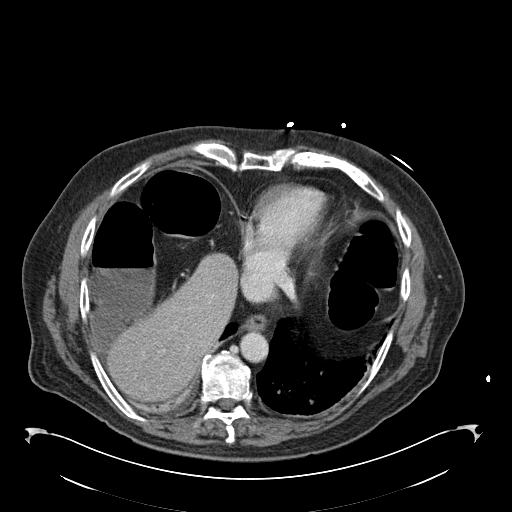
[im 158/180  lung]
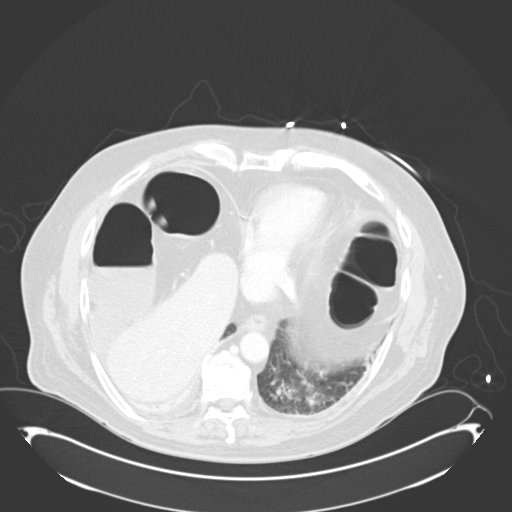
[im 169/180  soft-tissue]
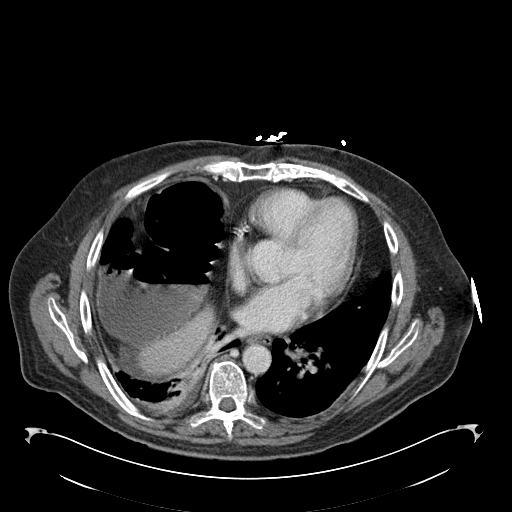
[im 169/180  lung]
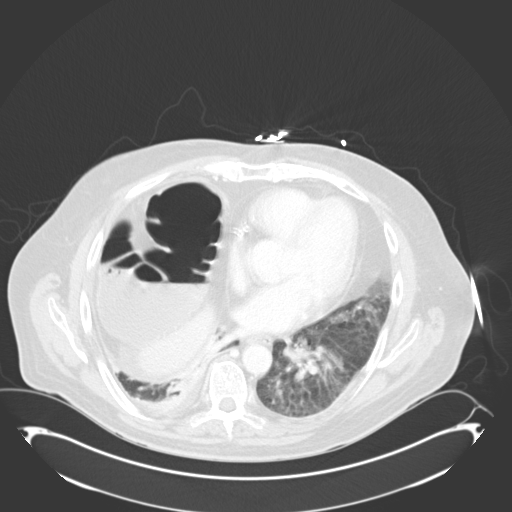

[13 of 32 positions shown; findings below may reference images not displayed]

PROCEDURE:     CT  - CT CHEST ABDOMEN AND PELVIS W  - October 10, 2012  [DATE]

RESULT:     CT of the chest, abdomen and pelvis is performed with 100 mL of
2sovue-NV5 iodinated intravenous contrast with images reconstructed at 3 mm
slice thickness in the axial plane. There is elevation of the right
hemidiaphragm with right middle lobe atelectasis. There is posterior
narrowing of the trachea by the esophagus at the level of the aortic arch.
The esophagus contains an air with a slightly thickened wall. A discrete
esophageal mass is not appreciated. Correlate for esophagitis. There is
evidence of nonocclusive pulmonary embolic filling defect in the proximal
left upper lobe pulmonary artery best appreciated on image 25 and image 24.
There is fairly significant respiratory motion artifact. No main pulmonary
arterial filling defect is seen. No right main pulmonary arterial filling
defect is seen. No other definite left main pulmonary arterial filling
defect is evident. Smaller branch filling defect may be of secured by motion
artifact. There is some right lower lobe atelectasis. A trace right pleural
effusion may be present. There is air within a prominent loop of the hepatic
flexure insinuated between the liver and then the diaphragm anteriorly. This
loop has a transverse diameter of 8.19 cm. The heart is normal in caliber.
The thoracic aorta is normal in caliber without dissection. And there is no
intraperitoneal free air. The liver shows no discrete mass or ductal
dilation. The spleen is normal in caliber. The kidneys show no abnormal
distention. Atherosclerotic calcification is present within the abdominal
aorta without aneurysm. The adrenal glands and pancreas appear unremarkable.
There is no adenopathy. No abnormal small bowel distention is evident. There
is a large amount of air throughout the colon with some small air-fluid
levels present. No obstructing lesion or area of transition is observed.
There is an air-fluid level in the rectum. The urinary bladder is not
abnormally distended. The prostate appears unremarkable. The abdominal wall
appears intact. The appendix appears present and unremarkable. Bony
structures appear unremarkable.
IMPRESSION: 1. Pulmonary embolism in the proximal left upper lobe pulmonary artery.
2. No thoracic aortic aneurysm or abdominal aortic aneurysm. No thoracic
aortic dissection.
3. The chest portion is degraded by respiratory motion artifact. There is
elevation of the right hemidiaphragm with presumed atelectasis in the right
lower lobe and right middle lobe.
4. Prominent predominantly air-filled loops of colon with some small
air-fluid levels without a transition area. No definite evidence of bowel
obstruction.

[REDACTED]

## 2014-01-12 DIAGNOSIS — H353290 Exudative age-related macular degeneration, unspecified eye, stage unspecified: Secondary | ICD-10-CM | POA: Diagnosis not present

## 2014-01-12 DIAGNOSIS — H43819 Vitreous degeneration, unspecified eye: Secondary | ICD-10-CM | POA: Diagnosis not present

## 2014-01-26 ENCOUNTER — Other Ambulatory Visit: Payer: Self-pay

## 2014-01-26 MED ORDER — PRAVASTATIN SODIUM 20 MG PO TABS: 20.0000 mg | ORAL_TABLET | Freq: Every day | ORAL | Status: DC

## 2014-01-26 NOTE — Telephone Encounter (Signed)
Pt has CPX scheduled 04/2014 and [facility] Advised pt # [phone] sent to pharmacy.

## 2014-01-27 ENCOUNTER — Ambulatory Visit: Payer: Medicare Other | Admitting: Internal Medicine

## 2014-02-09 DIAGNOSIS — H353290 Exudative age-related macular degeneration, unspecified eye, stage unspecified: Secondary | ICD-10-CM | POA: Diagnosis not present

## 2014-02-11 ENCOUNTER — Telehealth: Payer: Self-pay | Admitting: Internal Medicine

## 2014-02-11 NOTE — Telephone Encounter (Signed)
error 

## 2014-02-22 ENCOUNTER — Encounter: Payer: Self-pay | Admitting: Family Medicine

## 2014-02-22 ENCOUNTER — Ambulatory Visit (INDEPENDENT_AMBULATORY_CARE_PROVIDER_SITE_OTHER): Payer: Medicare Other | Admitting: Family Medicine

## 2014-02-22 VITALS — BP 104/68 | HR 64 | Temp 97.4°F | Ht 68.0 in | Wt 194.2 lb

## 2014-02-22 DIAGNOSIS — M25559 Pain in unspecified hip: Secondary | ICD-10-CM | POA: Diagnosis not present

## 2014-02-22 DIAGNOSIS — M48061 Spinal stenosis, lumbar region without neurogenic claudication: Secondary | ICD-10-CM | POA: Diagnosis not present

## 2014-02-22 DIAGNOSIS — M25551 Pain in right hip: Secondary | ICD-10-CM

## 2014-02-22 NOTE — Progress Notes (Signed)
Date:  02/22/2014   Name:  Ricardo Schubach   DOB:  1933/01/18   MRN:  [id number]  Primary Physician:  Viviana Simpler, MD   Chief Complaint: Hip Pain   Subjective:   History of Present Illness:  Juan Horn is a 80 y.o. pleasant patient who presents with the following:  Patient with a history of significant L4-5 spinal stenosis with moderate to severe degree of spinal stenosis with bilateral foraminal encroachment upon nerve exit and a history of left-sided total knee arthroplasty who presents with what he describes is primarily posterior hip pain in the region of the pelvic rim. This worsens after exercise and walking. At rest now, he has minimal pain, but he does have posterior buttocks pain with movement, which initiates after approximately 200 yards. He denies any groin pain.  No pain at all now, and will have some posterior buttocs pain. If sits down then able to go again.   Patient Active Problem List   Diagnosis Date Noted  . Routine general medical examination at a health care facility 04/02/2013  . Chronic venous insufficiency   . Anxiety   . Malignant neoplasm of prostate 03/23/2011  . Osteoarthrosis of knee 03/23/2011  . Glaucoma   . Macular degeneration   . HYPERLIPIDEMIA 10/24/2010  . DIVERTICULOSIS, COLON 10/24/2010  . SPINAL STENOSIS, LUMBAR 10/24/2010  . COLONIC POLYPS, HX OF 10/24/2010    Past Medical History  Diagnosis Date  . Personal history of prostate cancer   . Hx of colonic polyp   . Diverticulosis of colon   . HLD (hyperlipidemia)   . Glaucoma   . Macular degeneration     legally blind in right eye  . Spinal stenosis of lumbar region   . OA (osteoarthritis)   . Anxiety   . Pulmonary embolism 11/13    post op TKR  . Chronic venous insufficiency     Past Surgical History  Procedure Laterality Date  . Appendectomy    . Inguinal hernia repair      left  . Prostatectomy    . Tonsillectomy    . Varicose vein surgery      left  .  Inguinal hernia repair  5/12    Dr Priscille Heidelberg  . Inguinal hernia repair  5/12    Dr Jamal Collin did redo of this  . Joint replacement  11/13    Left total knee--Dr Hooten    History   Social History  . Marital Status: Married    Spouse Name: N/A    Number of Children: 0  . Years of Education: N/A   Occupational History  . Retired-purchasing for [employer]    Social History Main Topics  . Smoking status: Former Smoker    Quit date: 12/03/1978  . Smokeless tobacco: Never Used  . Alcohol Use: Yes     Comment: Wine-several times per week  . Drug Use: Not on file  . Sexual Activity: Not on file   Other Topics Concern  . Not on file   Social History Narrative   Has living will    DNR done 11/12   Wife is health care POA.   No feeding tube if cognitively unaware    Family History  Problem Relation Age of Onset  . Stroke Father   . Dementia Mother   . Leukemia Sister     Allergies  Allergen Reactions  . Naphazoline-Polyethyl Glycol Other (See Comments)    (Afgan) redness    Medication list has  been reviewed and updated.  Review of Systems:  GEN: No fevers, chills. Nontoxic. Primarily MSK c/o today. MSK: Detailed in the HPI GI: tolerating PO intake without difficulty Neuro: No numbness, parasthesias, or tingling associated. Otherwise the pertinent positives of the ROS are noted above.   Objective:   Physical Examination: BP 104/68  Pulse 64  Temp(Src) 97.4 F (36.3 C) (Oral)  Ht 5\' 8"  (1.727 m)  Wt 194 lb 4 oz (88.111 kg)  BMI 29.54 kg/m2  Ideal Body Weight: Weight in (lb) to have BMI = 25: 164.1   GEN: WDWN, NAD, Non-toxic, Alert & Oriented x 3 HEENT: Atraumatic, Normocephalic.  Ears and Nose: No external deformity. EXTR: No clubbing/cyanosis/edema NEURO: Normal gait.  PSYCH: Normally interactive. Conversant. Not depressed or anxious appearing.  Calm demeanor.   HIP EXAM: SIDE: b ROM: Abduction, Flexion, Internal and External range of motion:  full Pain with terminal IROM and EROM: no There is some mild tenderness on the posterior pelvic rim, more laterally. GTB: NT SLR: NEG Knees: No effusion FABER: NT REVERSE FABER: NT, neg Piriformis: NT at direct palpation Str: flexion: 5/5 abduction: 5/5 adduction: 5/5 Strength testing non-tender   he has some mild erector spinae tenderness on the RIGHT around the region of L5, but he has no bony tenderness. He is otherwise neurovascularly intact in the lower extremities.  No results found.  Assessment & Plan:   Posterior pain of right hip  SPINAL STENOSIS, LUMBAR  It is unclear to me if this is from spinal stenosis origin or could be secondary to more likely gluteus medius tendinopathy versus potential pelvic rim bursitis. We discussed potential treatment options, and for now I think it is reasonable to do a diagnostic and therapeutic posterior pelvic injection to see this alleviates his symptoms. If he has no benefit, then this is most likely secondary to spinal stenosis.  Posterior Pelvic Rim Injection, R, gluteus medius insertion Verbal consent obtained. Risks (including infection, potential atrophy), benefits, and alternatives reviewed. Posterior pelvic rim sterilely prepped with Chloraprep. Ethyl Chloride used for anesthesia. 8 cc of Lidocaine 1% injected with 2 cc of 40 mg Depo-Medrol into 4 areas along pelvic rim posteriorly adjacent to muscular insertion. Needle taken to bone, flows easily. Bursa and soft tissue massaged. No bleeding. Post procedure, the patient became somewhat orthostatic and dizzy, so we had him recline on the examination table and routinely checked on the patient. Additionally, he did have some numbness, which abated after approximately one hour, with some appreciable weakness on the RIGHT side during gait. I explained to the patient that this happens occasionally, and the sciatic nerve or one of the other nerves in the pelvis can be anesthetized with lidocaine  given the use of 8 cc in his situation. This recovered after the lidocaine wore off, and the patient felt better over time. Both Ms. Delsa Sale and myself checked on the patient routinely until the time where he felt comfortable enough to walk out of the examination room and until he felt well. Needle: 22 gauge spinal needle   No Follow-up on file.  No orders of the defined types were placed in this encounter.   Patient's Medications  New Prescriptions   No medications on file  Previous Medications   ALPRAZOLAM (XANAX) 0.25 MG TABLET    Take 1 tablet (0.25 mg total) by mouth 3 (three) times daily as needed.   CHOLECALCIFEROL (VITAMIN D-3) 1000 UNITS CAPS    Take by mouth 2 (two) times daily.  LATANOPROST (XALATAN) 0.005 % OPHTHALMIC SOLUTION    Place 1 drop into both eyes at bedtime.   MULTIPLE VITAMINS-MINERALS (PRESERVISION AREDS 2 PO)    Take 2 tablets by mouth 2 (two) times daily.   PRAVASTATIN (PRAVACHOL) 20 MG TABLET    Take 1 tablet (20 mg total) by mouth daily.  Modified Medications   No medications on file  Discontinued Medications   CITALOPRAM (CELEXA) 20 MG TABLET    Take 10 mg by mouth daily.   There are no Patient Instructions on file for this visit.  Signed,  Maud Deed. Georgeann Brinkman, MD, [place] [facility] [place] [address] Phone: [phone] Fax: [phone]

## 2014-02-22 NOTE — Progress Notes (Signed)
Pre visit review using our clinic review tool, if applicable. No additional management support is needed unless otherwise documented below in the visit note. 

## 2014-03-15 DIAGNOSIS — H4010X0 Unspecified open-angle glaucoma, stage unspecified: Secondary | ICD-10-CM | POA: Diagnosis not present

## 2014-03-16 DIAGNOSIS — H353290 Exudative age-related macular degeneration, unspecified eye, stage unspecified: Secondary | ICD-10-CM | POA: Diagnosis not present

## 2014-04-06 ENCOUNTER — Encounter: Payer: Self-pay | Admitting: Internal Medicine

## 2014-04-06 ENCOUNTER — Ambulatory Visit (INDEPENDENT_AMBULATORY_CARE_PROVIDER_SITE_OTHER): Payer: Medicare Other | Admitting: Internal Medicine

## 2014-04-06 VITALS — BP 100/68 | HR 66 | Temp 98.4°F | Ht 68.0 in | Wt 195.0 lb

## 2014-04-06 DIAGNOSIS — C61 Malignant neoplasm of prostate: Secondary | ICD-10-CM

## 2014-04-06 DIAGNOSIS — E785 Hyperlipidemia, unspecified: Secondary | ICD-10-CM

## 2014-04-06 DIAGNOSIS — F411 Generalized anxiety disorder: Secondary | ICD-10-CM

## 2014-04-06 DIAGNOSIS — M48061 Spinal stenosis, lumbar region without neurogenic claudication: Secondary | ICD-10-CM | POA: Diagnosis not present

## 2014-04-06 DIAGNOSIS — Z0000 Encounter for general adult medical examination without abnormal findings: Secondary | ICD-10-CM

## 2014-04-06 DIAGNOSIS — F419 Anxiety disorder, unspecified: Secondary | ICD-10-CM

## 2014-04-06 LAB — LIPID PANEL
Cholesterol: 175 mg/dL (ref 0–200)
HDL: 42.9 mg/dL (ref >39.00)
LDL CALC: 73 mg/dL (ref 0–99)
Total CHOL/HDL Ratio: 4
Triglycerides: 295 mg/dL — ABNORMAL HIGH (ref 0.0–149.0)
VLDL: 59 mg/dL — ABNORMAL HIGH (ref 0.0–40.0)

## 2014-04-06 LAB — CBC WITH DIFFERENTIAL/PLATELET
BASOS ABS: 0 10*3/uL (ref 0.0–0.1)
Basophils Relative: 0.4 % (ref 0.0–3.0)
EOS ABS: 0.1 10*3/uL (ref 0.0–0.7)
Eosinophils Relative: 2.2 % (ref 0.0–5.0)
HEMATOCRIT: 42.6 % (ref 39.0–52.0)
Hemoglobin: 14.7 g/dL (ref 13.0–17.0)
LYMPHS ABS: 1.8 10*3/uL (ref 0.7–4.0)
Lymphocytes Relative: 28.5 % (ref 12.0–46.0)
MCHC: 34.6 g/dL (ref 30.0–36.0)
MCV: 94.3 fl (ref 78.0–100.0)
MONO ABS: 0.6 10*3/uL (ref 0.1–1.0)
Monocytes Relative: 9.7 % (ref 3.0–12.0)
NEUTROS PCT: 59.2 % (ref 43.0–77.0)
Neutro Abs: 3.7 10*3/uL (ref 1.4–7.7)
PLATELETS: 149 10*3/uL — AB (ref 150.0–400.0)
RBC: 4.52 Mil/uL (ref 4.22–5.81)
RDW: 13.5 % (ref 11.5–15.5)
WBC: 6.3 10*3/uL (ref 4.0–10.5)

## 2014-04-06 LAB — COMPREHENSIVE METABOLIC PANEL
ALBUMIN: 3.6 g/dL (ref 3.5–5.2)
ALK PHOS: 59 U/L (ref 39–117)
ALT: 33 U/L (ref 0–53)
AST: 25 U/L (ref 0–37)
BILIRUBIN TOTAL: 0.7 mg/dL (ref 0.2–1.2)
BUN: 26 mg/dL — ABNORMAL HIGH (ref 6–23)
CO2: 27 mEq/L (ref 19–32)
Calcium: 8.7 mg/dL (ref 8.4–10.5)
Chloride: 106 mEq/L (ref 96–112)
Creatinine, Ser: 1.3 mg/dL (ref 0.4–1.5)
GFR: 58.15 mL/min — ABNORMAL LOW (ref >60.00)
GLUCOSE: 80 mg/dL (ref 70–99)
POTASSIUM: 4.1 meq/L (ref 3.5–5.1)
SODIUM: 141 meq/L (ref 135–145)
TOTAL PROTEIN: 6.2 g/dL (ref 6.0–8.3)

## 2014-04-06 LAB — PSA: PSA: 0.01 ng/mL — ABNORMAL LOW (ref 0.10–4.00)

## 2014-04-06 LAB — T4, FREE: FREE T4: 1.04 ng/dL (ref 0.60–1.60)

## 2014-04-06 LAB — TSH: TSH: 1.29 u[IU]/mL (ref 0.35–4.50)

## 2014-04-06 NOTE — Progress Notes (Signed)
Pre visit review using our clinic review tool, if applicable. No additional management support is needed unless otherwise documented below in the visit note. 

## 2014-04-06 NOTE — Progress Notes (Signed)
Subjective:    Patient ID: Juan Horn, male    DOB: 11/03/1933, 80 y.o.   MRN: [id number]  HPI Here for Medicare wellness and follow up Reviewed form Regularly will have 1 drink a day No tobacco now Not much exercise--- discussed Reviewed list of other doctors he sees No falls No depression or anhedonia Independent with instrumental ADLs Mild hearing problems. Vision is okay in left eye No cognitive problems that he notes----wife has noted some mild memory loss  Did very well with injection from Dr Lorelei Pont Able to walk more Less hip pain Still has right knee pain but no leg pain with walking Some low back pain-- but can walk a fair length of time Able to play golf again  Still on the cholesterol med No myalgia or GI problems Okay with continuing  Did wean off the citalopram completely Had one episode of "getting uptight" Hasn't used the alprazolam at all  Prostate removed in 2010 Wants to continue monitoring for now  Ongoing Rx for wet macular degeneration May be finishing with the shots  Current Outpatient Prescriptions on File Prior to Visit  Medication Sig Dispense Refill  . ALPRAZolam (XANAX) 0.25 MG tablet Take 1 tablet (0.25 mg total) by mouth 3 (three) times daily as needed.  40 tablet  0  . Cholecalciferol (VITAMIN D-3) 1000 UNITS CAPS Take by mouth 2 (two) times daily.      Marland Kitchen latanoprost (XALATAN) 0.005 % ophthalmic solution Place 1 drop into both eyes at bedtime.      . Multiple Vitamins-Minerals (PRESERVISION AREDS 2 PO) Take 2 tablets by mouth 2 (two) times daily.      . pravastatin (PRAVACHOL) 20 MG tablet Take 1 tablet (20 mg total) by mouth daily.  90 tablet  0   No current facility-administered medications on file prior to visit.    Allergies  Allergen Reactions  . Naphazoline-Polyethyl Glycol Other (See Comments)    (Afgan) redness    Past Medical History  Diagnosis Date  . Personal history of prostate cancer   . Hx of colonic polyp     . Diverticulosis of colon   . HLD (hyperlipidemia)   . Glaucoma   . Macular degeneration     legally blind in right eye  . Spinal stenosis of lumbar region   . OA (osteoarthritis)   . Anxiety   . Pulmonary embolism 11/13    post op TKR  . Chronic venous insufficiency     Past Surgical History  Procedure Laterality Date  . Appendectomy    . Inguinal hernia repair      left  . Prostatectomy    . Tonsillectomy    . Varicose vein surgery      left  . Inguinal hernia repair  5/12    Dr Priscille Heidelberg  . Inguinal hernia repair  5/12    Dr Jamal Collin did redo of this  . Joint replacement  11/13    Left total knee--Dr Hooten    Family History  Problem Relation Age of Onset  . Stroke Father   . Dementia Mother   . Leukemia Sister     History   Social History  . Marital Status: Married    Spouse Name: N/A    Number of Children: 0  . Years of Education: N/A   Occupational History  . Retired-purchasing for [employer]    Social History Main Topics  . Smoking status: Former Smoker    Quit date: 12/03/1978  .  Smokeless tobacco: Never Used  . Alcohol Use: Yes     Comment: Wine-several times per week  . Drug Use: Not on file  . Sexual Activity: Not on file   Other Topics Concern  . Not on file   Social History Narrative   Has living will   DNR done 11/12   Wife is health care POA.   No feeding tube if cognitively unaware   Review of Systems No leg swelling Appetite is good Weight is stable Bowels are good Voids okay. Stream is decreased. Rare dribbling. Nocturia is inconsistent    Objective:   Physical Exam  Constitutional: He is oriented to person, place, and time. He appears well-developed and well-nourished. No distress.  HENT:  Mouth/Throat: Oropharynx is clear and moist. No oropharyngeal exudate.  Neck: Normal range of motion. Neck supple. No thyromegaly present.  Cardiovascular: Normal rate, regular rhythm, normal heart sounds and intact distal pulses.   Exam reveals no gallop.   No murmur heard. Pulmonary/Chest: Effort normal. No respiratory distress. He has no wheezes.  Very slight bibasilar crackles  Abdominal: Soft. There is no tenderness.  Musculoskeletal: He exhibits no edema and no tenderness.  Lymphadenopathy:    He has no cervical adenopathy.  Neurological: He is alert and oriented to person, place, and time.  President-- "Obama, Remer Macho, the old Bush" [id number] Recall 3/3  Skin: No rash noted.  Psychiatric: He has a normal mood and affect. His behavior is normal.          Assessment & Plan:

## 2014-04-06 NOTE — Assessment & Plan Note (Signed)
Very mild Doing okay off the citalopram Hasn't needed the alprazolam but keeps it just in case

## 2014-04-06 NOTE — Assessment & Plan Note (Signed)
Still wants to continue primary prevention 

## 2014-04-06 NOTE — Assessment & Plan Note (Signed)
I have personally reviewed the Medicare Annual Wellness questionnaire and have noted 1. The patient's medical and social history 2. Their use of alcohol, tobacco or illicit drugs 3. Their current medications and supplements 4. The patient's functional ability including ADL's, fall risks, home safety risks and hearing or visual             impairment. 5. Diet and physical activities 6. Evidence for depression or mood disorders  The patients weight, height, BMI and visual acuity have been recorded in the chart I have made referrals, counseling and provided education to the patient based review of the above and I have provided the pt with a written personalized care plan for preventive services.  I have provided you with a copy of your personalized plan for preventive services. Please take the time to review along with your updated medication list.  Will give prevnar Need to review colonoscopy records---may do one more if had adenomatous polyps on last one

## 2014-04-06 NOTE — Assessment & Plan Note (Signed)
5 years out Will check PSA today---may not check after this

## 2014-04-06 NOTE — Assessment & Plan Note (Signed)
Mild disability Walking better after shot from Dr Lorelei Pont

## 2014-04-06 NOTE — Patient Instructions (Signed)
Please get the records from your colonoscopy in [place] a few years ago. It may be worth doing one more colonoscopy.

## 2014-04-13 DIAGNOSIS — H353290 Exudative age-related macular degeneration, unspecified eye, stage unspecified: Secondary | ICD-10-CM | POA: Diagnosis not present

## 2014-04-21 ENCOUNTER — Telehealth: Payer: Self-pay

## 2014-04-21 NOTE — Telephone Encounter (Signed)
rx faxed back to Dr. Lynelle Smoke and scanned in

## 2014-04-21 NOTE — Telephone Encounter (Signed)
He should continue the antibiotics before appts You can send the Rx in my name

## 2014-04-21 NOTE — Telephone Encounter (Signed)
Pt left note wanting to know if needs pre medication of antibiotic before dental appts; letter from Dr Lynelle Smoke also brought; letter in Dr Alla German in box for review.

## 2014-04-22 NOTE — Telephone Encounter (Signed)
Pt left v/m requesting cb status of antibiotics prior to dental appt.left v/m asking pt to cb.

## 2014-04-23 NOTE — Telephone Encounter (Signed)
Pt called back and notified as instructed by note. Pt voiced understanding.

## 2014-05-10 DIAGNOSIS — H353290 Exudative age-related macular degeneration, unspecified eye, stage unspecified: Secondary | ICD-10-CM | POA: Diagnosis not present

## 2014-06-07 DIAGNOSIS — H353290 Exudative age-related macular degeneration, unspecified eye, stage unspecified: Secondary | ICD-10-CM | POA: Diagnosis not present

## 2014-06-08 DIAGNOSIS — D1800 Hemangioma unspecified site: Secondary | ICD-10-CM | POA: Diagnosis not present

## 2014-06-08 DIAGNOSIS — L905 Scar conditions and fibrosis of skin: Secondary | ICD-10-CM | POA: Diagnosis not present

## 2014-06-08 DIAGNOSIS — L819 Disorder of pigmentation, unspecified: Secondary | ICD-10-CM | POA: Diagnosis not present

## 2014-06-08 DIAGNOSIS — L578 Other skin changes due to chronic exposure to nonionizing radiation: Secondary | ICD-10-CM | POA: Diagnosis not present

## 2014-06-08 DIAGNOSIS — Z85828 Personal history of other malignant neoplasm of skin: Secondary | ICD-10-CM | POA: Diagnosis not present

## 2014-06-08 DIAGNOSIS — L570 Actinic keratosis: Secondary | ICD-10-CM | POA: Diagnosis not present

## 2014-06-08 DIAGNOSIS — L821 Other seborrheic keratosis: Secondary | ICD-10-CM | POA: Diagnosis not present

## 2014-06-09 ENCOUNTER — Ambulatory Visit (INDEPENDENT_AMBULATORY_CARE_PROVIDER_SITE_OTHER): Payer: Medicare Other | Admitting: *Deleted

## 2014-06-09 DIAGNOSIS — Z23 Encounter for immunization: Secondary | ICD-10-CM

## 2014-06-22 ENCOUNTER — Other Ambulatory Visit: Payer: Self-pay | Admitting: Internal Medicine

## 2014-07-05 DIAGNOSIS — H353290 Exudative age-related macular degeneration, unspecified eye, stage unspecified: Secondary | ICD-10-CM | POA: Diagnosis not present

## 2014-08-04 DIAGNOSIS — H353290 Exudative age-related macular degeneration, unspecified eye, stage unspecified: Secondary | ICD-10-CM | POA: Diagnosis not present

## 2014-08-16 ENCOUNTER — Encounter: Payer: Self-pay | Admitting: Internal Medicine

## 2014-08-16 ENCOUNTER — Ambulatory Visit (INDEPENDENT_AMBULATORY_CARE_PROVIDER_SITE_OTHER): Payer: Medicare Other | Admitting: Internal Medicine

## 2014-08-16 VITALS — BP 110/70 | HR 81 | Temp 98.5°F | Wt 192.0 lb

## 2014-08-16 DIAGNOSIS — M25559 Pain in unspecified hip: Secondary | ICD-10-CM

## 2014-08-16 DIAGNOSIS — M25551 Pain in right hip: Secondary | ICD-10-CM | POA: Insufficient documentation

## 2014-08-16 DIAGNOSIS — M1711 Unilateral primary osteoarthritis, right knee: Secondary | ICD-10-CM

## 2014-08-16 DIAGNOSIS — M1710 Unilateral primary osteoarthritis, unspecified knee: Secondary | ICD-10-CM | POA: Diagnosis not present

## 2014-08-16 NOTE — Progress Notes (Signed)
Subjective:    Patient ID: Juan Horn, male    DOB: [birth year], 80 y.o.   MRN: [id number]  HPI Having more right knee pain Got even worse on photography outing this weekend Has trip to [place] coming up Wants injection in right knee  Has hip pain also Did get some relief from buttock injection by Dr Lorelei Pont a few months ago May need follow up with Dr Marry Guan  No set exercise Does try to walk some--but not very far Gets hip pain with walking--then better with rest  Current Outpatient Prescriptions on File Prior to Visit  Medication Sig Dispense Refill  . ALPRAZolam (XANAX) 0.25 MG tablet Take 1 tablet (0.25 mg total) by mouth 3 (three) times daily as needed.  40 tablet  0  . amoxicillin (AMOXIL) 500 MG capsule 4 capsules 1 hour prior to tx      . Cholecalciferol (VITAMIN D-3) 1000 UNITS CAPS Take by mouth 2 (two) times daily.      Marland Kitchen latanoprost (XALATAN) 0.005 % ophthalmic solution Place 1 drop into both eyes at bedtime.      . Multiple Vitamins-Minerals (PRESERVISION AREDS 2 PO) Take 2 tablets by mouth 2 (two) times daily.      . pravastatin (PRAVACHOL) 20 MG tablet TAKE 1 TABLET DAILY  90 tablet  3   No current facility-administered medications on file prior to visit.    Allergies  Allergen Reactions  . Naphazoline-Polyethyl Glycol Other (See Comments)    (Afgan) redness    Past Medical History  Diagnosis Date  . Personal history of prostate cancer   . Hx of colonic polyp   . Diverticulosis of colon   . HLD (hyperlipidemia)   . Glaucoma   . Macular degeneration     legally blind in right eye  . Spinal stenosis of lumbar region   . OA (osteoarthritis)   . Anxiety   . Pulmonary embolism 11/13    post op TKR  . Chronic venous insufficiency     Past Surgical History  Procedure Laterality Date  . Appendectomy    . Inguinal hernia repair      left  . Prostatectomy    . Tonsillectomy    . Varicose vein surgery      left  . Inguinal hernia repair  5/12      Dr Priscille Heidelberg  . Inguinal hernia repair  5/12    Dr Jamal Collin did redo of this  . Joint replacement  11/13    Left total knee--Dr Hooten    Family History  Problem Relation Age of Onset  . Stroke Father   . Dementia Mother   . Leukemia Sister     History   Social History  . Marital Status: Married    Spouse Name: N/A    Number of Children: 0  . Years of Education: N/A   Occupational History  . Retired-purchasing for [employer]    Social History Main Topics  . Smoking status: Former Smoker    Quit date: 12/03/1978  . Smokeless tobacco: Never Used  . Alcohol Use: Yes     Comment: Wine-several times per week  . Drug Use: Not on file  . Sexual Activity: Not on file   Other Topics Concern  . Not on file   Social History Narrative   Has living will   DNR done 11/12   Wife is health care POA.   No feeding tube if cognitively unaware   Review of  Systems     Objective:   Physical Exam  Constitutional: He appears well-developed and well-nourished. No distress.  Musculoskeletal:  No knee or hip swelling Minimal internal rotation of right hip--- doesn't cause pain though Some crepitus in right knee Pain is posterolateral right hip  Neurological:  Normal gait No focal weakness          Assessment & Plan:

## 2014-08-16 NOTE — Progress Notes (Signed)
Pre visit review using our clinic review tool, if applicable. No additional management support is needed unless otherwise documented below in the visit note. 

## 2014-08-16 NOTE — Assessment & Plan Note (Signed)
PROCEDURE Sterile prep to medial right knee Ethyl chloride then 2cc 2%lidocaine 40mg  depomedrol and 5cc 2% lidocaine instilled without difficulty He noticed some improvement in pain Gave home instructions

## 2014-08-16 NOTE — Assessment & Plan Note (Signed)
Doesn't appear to have bursitis Clearly has decreased ROM but I don't think his pain is arthritic in the hip Exertional pain could be from spinal stenosis He responded to injection that Dr Lorelei Pont did in spring---will schedule to repeat this before his big trip

## 2014-08-25 ENCOUNTER — Encounter: Payer: Self-pay | Admitting: Family Medicine

## 2014-08-25 ENCOUNTER — Ambulatory Visit (INDEPENDENT_AMBULATORY_CARE_PROVIDER_SITE_OTHER): Payer: Medicare Other | Admitting: Family Medicine

## 2014-08-25 VITALS — BP 110/64 | HR 76 | Temp 98.1°F | Ht 68.0 in | Wt 190.8 lb

## 2014-08-25 DIAGNOSIS — M25551 Pain in right hip: Secondary | ICD-10-CM

## 2014-08-25 DIAGNOSIS — M25559 Pain in unspecified hip: Secondary | ICD-10-CM | POA: Diagnosis not present

## 2014-08-25 DIAGNOSIS — M76899 Other specified enthesopathies of unspecified lower limb, excluding foot: Secondary | ICD-10-CM | POA: Diagnosis not present

## 2014-08-25 DIAGNOSIS — M48061 Spinal stenosis, lumbar region without neurogenic claudication: Secondary | ICD-10-CM | POA: Diagnosis not present

## 2014-08-25 DIAGNOSIS — M7071 Other bursitis of hip, right hip: Secondary | ICD-10-CM

## 2014-08-25 MED ORDER — METHYLPREDNISOLONE ACETATE 40 MG/ML IJ SUSP
80.0000 mg | Freq: Once | INTRAMUSCULAR | Status: AC
  Administered 2014-08-25: 80 mg via INTRA_ARTICULAR

## 2014-08-25 NOTE — Progress Notes (Signed)
Dr. Frederico Hamman T. Abenezer Odonell, MD, [facility] [place], [address] Phone: [phone] Fax: [phone]  08/25/2014  Patient: Juan Horn, MRN: [id number], DOB: Nov 02, 1933, 80 y.o.  Primary Physician:  Viviana Simpler, MD  Chief Complaint: Hip Pain   Subjective:   History of Present Illness:  Juan Horn is a 80 y.o. pleasant patient who presents with the following:  Pleasant gentleman with history below with known L4-5 spinal stenosis moderate to severe in nature who has some buttocks pain on the right which responded well to a steroid injection along the pelvic rim about 6 months ago. He had relief of symptoms for about 4-5 months. He is here today to revisit this.   02/24/2014 Last OV with Owens Loffler, MD  Patient with a history of significant L4-5 spinal stenosis with moderate to severe degree of spinal stenosis with bilateral foraminal encroachment upon nerve exit and a history of left-sided total knee arthroplasty who presents with what he describes is primarily posterior hip pain in the region of the pelvic rim. This worsens after exercise and walking. At rest now, he has minimal pain, but he does have posterior buttocks pain with movement, which initiates after approximately 200 yards. He denies any groin pain.  No pain at all now, and will have some posterior buttocs pain. If sits down then able to go again.   Patient Active Problem List   Diagnosis Date Noted  . Right hip pain 08/16/2014  . Routine general medical examination at a health care facility 04/02/2013  . Chronic venous insufficiency   . Anxiety   . Malignant neoplasm of prostate 03/23/2011  . Osteoarthrosis of knee 03/23/2011  . Glaucoma   . Macular degeneration   . HYPERLIPIDEMIA 10/24/2010  . DIVERTICULOSIS, COLON 10/24/2010  . SPINAL STENOSIS, LUMBAR 10/24/2010  . COLONIC POLYPS, HX OF 10/24/2010    Past Medical History  Diagnosis  Date  . Personal history of prostate cancer   . Hx of colonic polyp   . Diverticulosis of colon   . HLD (hyperlipidemia)   . Glaucoma   . Macular degeneration     legally blind in right eye  . Spinal stenosis of lumbar region   . OA (osteoarthritis)   . Anxiety   . Pulmonary embolism 11/13    post op TKR  . Chronic venous insufficiency     Past Surgical History  Procedure Laterality Date  . Appendectomy    . Inguinal hernia repair      left  . Prostatectomy    . Tonsillectomy    . Varicose vein surgery      left  . Inguinal hernia repair  5/12    Dr Priscille Heidelberg  . Inguinal hernia repair  5/12    Dr Jamal Collin did redo of this  . Joint replacement  11/13    Left total knee--Dr Hooten    History   Social History  . Marital Status: Married    Spouse Name: N/A    Number of Children: 0  . Years of Education: N/A   Occupational History  . Retired-purchasing for [employer]    Social History Main Topics  . Smoking status: Former Smoker    Quit date: 12/03/1978  . Smokeless tobacco: Never Used  . Alcohol Use: Yes     Comment: Wine-several times per week  . Drug Use: Not on file  . Sexual Activity: Not on file   Other  Topics Concern  . Not on file   Social History Narrative   Has living will   DNR done 11/12   Wife is health care POA.   No feeding tube if cognitively unaware    Family History  Problem Relation Age of Onset  . Stroke Father   . Dementia Mother   . Leukemia Sister     Allergies  Allergen Reactions  . Naphazoline-Polyethyl Glycol Other (See Comments)    (Afgan) redness    Medication list has been reviewed and updated.  Review of Systems:  GEN: No fevers, chills. Nontoxic. Primarily MSK c/o today. MSK: Detailed in the HPI GI: tolerating PO intake without difficulty Neuro: No numbness, parasthesias, or tingling associated. Otherwise the pertinent positives of the ROS are noted above.   Objective:   Physical Examination: BP  110/64  Pulse 76  Temp(Src) 98.1 F (36.7 C) (Oral)  Ht 5\' 8"  (1.727 m)  Wt 190 lb 12 oz (86.524 kg)  BMI 29.01 kg/m2  Ideal Body Weight: Weight in (lb) to have BMI = 25: 164.1   GEN: WDWN, NAD, Non-toxic, Alert & Oriented x 3 HEENT: Atraumatic, Normocephalic.  Ears and Nose: No external deformity. EXTR: No clubbing/cyanosis/edema NEURO: Normal gait.  PSYCH: Normally interactive. Conversant. Not depressed or anxious appearing.  Calm demeanor.   HIP EXAM: SIDE: b ROM: Abduction, Flexion, Internal and External range of motion: full Pain with terminal IROM and EROM: no There is some mild tenderness on the posterior pelvic rim, more towards midline. GTB: NT SLR: NEG Knees: No effusion FABER: NT REVERSE FABER: NT, neg Piriformis: NT at direct palpation Str: flexion: 5/5 abduction: 5/5 adduction: 5/5 Strength testing non-tender   he has some mild erector spinae tenderness on the RIGHT around the region of L5, but he has no bony tenderness. He is otherwise neurovascularly intact in the lower extremities.  No results found.  Assessment & Plan:   Hip bursitis, right  Right hip pain - Plan: methylPREDNISolone acetate (DEPO-MEDROL) injection 80 mg  SPINAL STENOSIS, LUMBAR  It is unclear to me if this is from spinal stenosis origin or could be secondary to more likely gluteus medius tendinopathy versus potential pelvic rim bursitis. He had good response from prior injection to pelvic rim.  Posterior Pelvic Rim Injection, R, gluteus medius insertion Verbal consent obtained. Risks (including infection, potential atrophy), benefits, and alternatives reviewed. Posterior pelvic rim sterilely prepped with Chloraprep. Ethyl Chloride used for anesthesia. 8 cc of Lidocaine 1% injected with 2 cc of 40 mg Depo-Medrol into 3 areas along pelvic rim posteriorly adjacent to muscular insertion. Needle taken to bone, flows easily. Bursa and soft tissue massaged. No bleeding. Felt better  post-injection. Needle: 22 gauge spinal needle   No Follow-up on file.  No orders of the defined types were placed in this encounter.   Patient's Medications  New Prescriptions   No medications on file  Previous Medications   ALPRAZOLAM (XANAX) 0.25 MG TABLET    Take 1 tablet (0.25 mg total) by mouth 3 (three) times daily as needed.   AMOXICILLIN (AMOXIL) 500 MG CAPSULE    4 capsules 1 hour prior to tx   CHOLECALCIFEROL (VITAMIN D-3) 1000 UNITS CAPS    Take by mouth 2 (two) times daily.   LATANOPROST (XALATAN) 0.005 % OPHTHALMIC SOLUTION    Place 1 drop into both eyes at bedtime.   MULTIPLE VITAMINS-MINERALS (PRESERVISION AREDS 2 PO)    Take 2 tablets by mouth 2 (two) times daily.  PRAVASTATIN (PRAVACHOL) 20 MG TABLET    TAKE 1 TABLET DAILY  Modified Medications   No medications on file  Discontinued Medications   No medications on file   There are no Patient Instructions on file for this visit.  Signed,  Maud Deed. Reid Nawrot, MD

## 2014-08-25 NOTE — Progress Notes (Signed)
Pre visit review using our clinic review tool, if applicable. No additional management support is needed unless otherwise documented below in the visit note. 

## 2014-09-13 DIAGNOSIS — H4011X4 Primary open-angle glaucoma, indeterminate stage: Secondary | ICD-10-CM | POA: Diagnosis not present

## 2014-09-17 DIAGNOSIS — H3532 Exudative age-related macular degeneration: Secondary | ICD-10-CM | POA: Diagnosis not present

## 2014-09-23 DIAGNOSIS — Z23 Encounter for immunization: Secondary | ICD-10-CM | POA: Diagnosis not present

## 2014-09-29 ENCOUNTER — Encounter: Payer: Self-pay | Admitting: Family Medicine

## 2014-09-29 ENCOUNTER — Ambulatory Visit (INDEPENDENT_AMBULATORY_CARE_PROVIDER_SITE_OTHER): Payer: Medicare Other | Admitting: Family Medicine

## 2014-09-29 ENCOUNTER — Ambulatory Visit (INDEPENDENT_AMBULATORY_CARE_PROVIDER_SITE_OTHER)
Admission: RE | Admit: 2014-09-29 | Discharge: 2014-09-29 | Disposition: A | Discharge status: Home / Self Care | Payer: Medicare Other | Source: Ambulatory Visit | Attending: Family Medicine | Admitting: Family Medicine

## 2014-09-29 VITALS — BP 124/74 | HR 68 | Temp 97.5°F | Ht 68.0 in | Wt 193.2 lb

## 2014-09-29 DIAGNOSIS — M129 Arthropathy, unspecified: Secondary | ICD-10-CM

## 2014-09-29 DIAGNOSIS — M1711 Unilateral primary osteoarthritis, right knee: Secondary | ICD-10-CM

## 2014-09-29 DIAGNOSIS — I739 Peripheral vascular disease, unspecified: Secondary | ICD-10-CM

## 2014-09-29 NOTE — Patient Instructions (Signed)

## 2014-09-29 NOTE — Progress Notes (Signed)
Pre visit review using our clinic review tool, if applicable. No additional management support is needed unless otherwise documented below in the visit note. 

## 2014-09-29 NOTE — Progress Notes (Signed)
Dr. Frederico Hamman T. Damen Windsor, MD, [facility] [place], [address] Phone: [phone] Fax: [phone]  09/29/2014  Patient: Juan Horn, MRN: [id number], DOB: 1932-12-12, 80 y.o.  Primary Physician:  Viviana Simpler, MD  Chief Complaint: Knee Pain and Leg Pain  Subjective:   Juan Horn is a 80 y.o. very pleasant male patient who presents with the following:  S/p 08/16/2014 R knee injection by Dr. Silvio Pate.  Pain in knee and pain in R calf with getting up and walking.   This is a very pleasant elderly gentleman, 80 years old, and I've seen him several times before for posterior hip pain and buttocks pain.  He most recently was seen by my partner for ongoing knee pain, and he got an intra-articular knee injection.  This happened in September 2015.  Right now he is having pain somewhat in the knee, and he is also having pain in the posterior aspect of his calf, primarily when he gets up, and when he walks for ways.  Past Medical History, Surgical History, Social History, Family History, Problem List, Medications, and Allergies have been reviewed and updated if relevant.  GEN: No fevers, chills. Nontoxic. Primarily MSK c/o today. MSK: Detailed in the HPI GI: tolerating PO intake without difficulty Neuro: No numbness, parasthesias, or tingling associated. Otherwise the pertinent positives of the ROS are noted above.   Objective:   BP 124/74  Pulse 68  Temp(Src) 97.5 F (36.4 C) (Oral)  Ht 5\' 8"  (1.727 m)  Wt 193 lb 4 oz (87.658 kg)  BMI 29.39 kg/m2  SpO2 97%   GEN: WDWN, NAD, Non-toxic, Alert & Oriented x 3 HEENT: Atraumatic, Normocephalic.  Ears and Nose: No external deformity. EXTR: No clubbing/cyanosis/edema NEURO: Normal gait.  PSYCH: Normally interactive. Conversant. Not depressed or anxious appearing.  Calm demeanor.   Essentially nontender throughout the patient's knee.  Nontender on medial lateral  joint line.  Patellar is nontender.  He does have some mild crepitus.  Nontender at the patellar tendon.  Nontender at the pes anserine bursa.  McMurray's is nontender.  Nontender along the posterior of the calf.  Homans test is negative.  DP and PT pulses appear slightly diminished bilaterally.   Radiology: No results found.  Assessment and Plan:   Arthritis of right knee - Plan: DG Knee AP/LAT W/Sunrise Right  Claudication - Plan: Lower Extremity Arterial Doppler Bilateral  i had some concern that this might be primarily claudication.  Check ABIs, which came back perfectly normal.  I'm suspicious that the patient's pain originates in his lumbar spine.  Follow-up: No Follow-up on file.  New Prescriptions   No medications on file   Orders Placed This Encounter  Procedures  . DG Knee AP/LAT W/Sunrise Right  . Lower Extremity Arterial Doppler Bilateral    Signed,  Diany Formosa T. Tiegan Jambor, MD   Patient's Medications  New Prescriptions   No medications on file  Previous Medications   ALPRAZOLAM (XANAX) 0.25 MG TABLET    Take 1 tablet (0.25 mg total) by mouth 3 (three) times daily as needed.   AMOXICILLIN (AMOXIL) 500 MG CAPSULE    4 capsules 1 hour prior to tx   CHOLECALCIFEROL (VITAMIN D-3) 1000 UNITS CAPS    Take by mouth 2 (two) times daily.   LATANOPROST (XALATAN) 0.005 % OPHTHALMIC SOLUTION    Place 1 drop into both eyes at bedtime.   MULTIPLE VITAMINS-MINERALS (PRESERVISION AREDS 2 PO)  Take 2 tablets by mouth 2 (two) times daily.   PRAVASTATIN (PRAVACHOL) 20 MG TABLET    TAKE 1 TABLET DAILY  Modified Medications   No medications on file  Discontinued Medications   No medications on file

## 2014-09-30 ENCOUNTER — Encounter (INDEPENDENT_AMBULATORY_CARE_PROVIDER_SITE_OTHER): Payer: Medicare Other

## 2014-09-30 DIAGNOSIS — I70221 Atherosclerosis of native arteries of extremities with rest pain, right leg: Secondary | ICD-10-CM

## 2014-09-30 DIAGNOSIS — I739 Peripheral vascular disease, unspecified: Secondary | ICD-10-CM

## 2014-10-20 DIAGNOSIS — H3531 Nonexudative age-related macular degeneration: Secondary | ICD-10-CM | POA: Diagnosis not present

## 2015-01-21 DIAGNOSIS — H3532 Exudative age-related macular degeneration: Secondary | ICD-10-CM | POA: Diagnosis not present

## 2015-02-03 DIAGNOSIS — M1711 Unilateral primary osteoarthritis, right knee: Secondary | ICD-10-CM | POA: Diagnosis not present

## 2015-02-03 DIAGNOSIS — M25561 Pain in right knee: Secondary | ICD-10-CM | POA: Diagnosis not present

## 2015-02-05 ENCOUNTER — Ambulatory Visit: Payer: Self-pay | Admitting: Unknown Physician Specialty

## 2015-02-05 DIAGNOSIS — M1711 Unilateral primary osteoarthritis, right knee: Secondary | ICD-10-CM | POA: Diagnosis not present

## 2015-02-05 DIAGNOSIS — M7121 Synovial cyst of popliteal space [Baker], right knee: Secondary | ICD-10-CM | POA: Diagnosis not present

## 2015-02-22 DIAGNOSIS — M1711 Unilateral primary osteoarthritis, right knee: Secondary | ICD-10-CM | POA: Diagnosis not present

## 2015-03-14 DIAGNOSIS — H4011X4 Primary open-angle glaucoma, indeterminate stage: Secondary | ICD-10-CM | POA: Diagnosis not present

## 2015-03-22 NOTE — H&P (Signed)
PATIENT NAME:  Juan Horn, Juan Horn MR#:  [id number] DATE OF BIRTH:  08-08-1933  DATE OF ADMISSION:  10/10/2012  PRIMARY CARE PHYSICIAN: Dr. Silvio Pate  CHIEF COMPLAINT: Shortness of breath.   HISTORY OF PRESENT ILLNESS: This is a 81 year old man who recently had a left total knee replacement by Dr. Marry Guan, operation on 10/06/2012. He was discharged 10/09/2012. Overnight he developed shortness of breath and needed to be put on oxygen. He was sent in to the Emergency Room for further work-up. In the Emergency Room he was found to have a pulmonary embolism in the proximal left upper pulmonary artery and hospitalist services were contacted for further admission secondary to the patient being hypoxic, was on 50% and saturating 93% on presentation He also states that his stomach has swelled up like a basketball. He has been having diarrhea, some nausea and a little vomiting and CT scan showed dilated colon without a transition point. No complaints of chest pain. He does feel wiped out and does get short of breath with minimal walking. He did have a fall last night.   PAST MEDICAL HISTORY:  1. Macular degeneration on the right and left eye, legally blind on the right.  2. History of prostate cancer status post prostatectomy.  3. Hyperlipidemia.  4. Depression.   PAST SURGICAL HISTORY:  1. Recent left total knee replacement. 2. Hernia repair x2.  3. Prostatectomy.  4. Varicose veins on the right.  5. Tonsillectomy.   ALLERGIES: Alphagan.   MEDICATIONS: Medications on presentation to the hospital include:  1. Aspirin 81 mg daily.  2. Celexa 10 mg daily.  3. Ibuprofen p.r.n.  4. Pravastatin 20 mg at bedtime.  5. PreserVision 2 tablets twice a day.  6. Vitamin D 1000 international units daily.  7. Patient states that he has been getting Lovenox injections.   SOCIAL HISTORY: Quit smoking 30 years ago. Does drink a glass of wine daily or every other day. No drug use. Is at [facility] rehab. Has a house  at [facility].   FAMILY HISTORY: father died at 26 of natural causes. mother died at 7 of dementia.   REVIEW OF SYSTEMS: CONSTITUTIONAL: No fever, chills, or sweats. No weight gain. No weight loss. Feels wiped out. EYES: He can't keep his eyes open. Legally blind in the right eye secondary to macular degeneration. EARS, NOSE, MOUTH, AND THROAT: Decreased hearing as per wife. No sore throat. No difficulty swallowing. CARDIOVASCULAR: No chest pain. No palpitations. RESPIRATORY: Positive for shortness of breath. Positive for cough, dark phlegm last Thursday. No hemoptysis. GASTROINTESTINAL: Positive for nausea, vomiting, abdominal distention and some diarrhea. GENITOURINARY: No burning on urination, no hematuria. MUSCULOSKELETAL: Some left knee pain. INTEGUMENTARY: No rashes or eruptions. NEUROLOGIC: Positive for feeling lightheaded and faint. PSYCHIATRIC: On medication for depression. ENDOCRINE: No thyroid problems. HEMATOLOGIC/LYMPHATIC: No anemia. No easy bruising or bleeding.   PHYSICAL EXAMINATION:  VITAL SIGNS: Pulse 78, respirations 20, blood pressure 144/79, pulse oximetry on presentation 92% on oxygen 50%.   GENERAL: No respiratory distress at this point but hypoxic.   EYES: Conjunctivae and lids normal. Pupils equal, round, and reactive to light. Extraocular muscles intact. No nystagmus.   EARS, NOSE, MOUTH, AND THROAT: Tympanic membrane no erythema. Nasal mucosa no erythema. Throat no erythema. No exudate seen. Lips and gums no lesions.   NECK: No JVD. No bruits. No lymphadenopathy. No thyromegaly. No thyroid nodules palpated.   RESPIRATORY: Lungs clear to auscultation. No use of accessory muscles to breathe. No rhonchi, rales,  or wheeze heard.   CARDIOVASCULAR: S1, S2 normal. No gallops, rubs, or murmurs heard. Carotid upstroke 2+ bilaterally. No bruits.   EXTREMITIES: Dorsalis pedis pulses 2+ bilaterally. Trace edema of the lower extremity.   ABDOMEN: Soft, distended. No  organomegaly/splenomegaly. Normoactive bowel sounds. No masses felt.   LYMPHATIC: No lymph nodes in the neck.   MUSCULOSKELETAL: No clubbing. Trace edema. No cyanosis.   SKIN: No rashes or ulcers seen. Did not remove surgical wound on the left knee, slight drainage on the bandage.   NEUROLOGIC: Cranial nerves II through XII grossly intact. Did not check deep tendon reflexes with recent knee replacement.   PSYCHIATRIC: Patient is oriented to person, place, and time.   LABORATORY, DIAGNOSTIC AND RADIOLOGICAL DATA: Troponin negative. Glucose 132, BUN 12, creatinine 1.12, sodium 136, potassium 3.5, chloride 97, CO2 30, calcium 8.5. Liver function tests: Total bilirubin up at 1.4, AST up at 43. White blood cell count 13.2, hemoglobin and hematocrit 13.7 and 39.3, platelet count 199. D-dimer elevated at 3.12. BNP elevated at 539. Chest x-ray showed decreased lung volumes bilaterally especially on the right, elevation of the hemidiaphragm. CT scan of the chest, abdomen, and pelvis showed pulmonary embolism proximal left upper pulmonary artery. No thoracic aortic aneurysm or abdominal aortic aneurysm. No dissection. Atelectasis in the right lower lobe and middle lobe. Air filled loops of colon with some small air fluid levels without a transition area. No evidence of bowel obstruction. Dilation measured as large as 8.19 cm.   ASSESSMENT AND PLAN:  1. Acute respiratory failure. Will continue oxygen supplementation.  2. Pulmonary embolism postoperative. Will put on heparin drip and start Coumadin. May be a candidate for Xarelto. Will get an ultrasound of the lower extremities and echocardiogram and continue to monitor respiratory status.  3. Dilated loops of bowel, possible ileus. Will put on MiraLax, liquid diet and gentle IV fluids and get an abdominal x-ray in the a.m. and continue to monitor closely.  4. Macular degeneration. On PreserVision.  5. History of prostate cancer.  6. Hyperlipidemia. Continue  pravastatin.  7. Depression. Continue Celexa.  8. Recent knee replacement. Will get an ortho consult with Dr. Marry Guan. Physical therapy consultation for tomorrow.  TIME SPENT ON ADMISSION: 55 minutes.   CODE STATUS: Patient is a FULL CODE.   ____________________________ Tana Conch. Leslye Peer, MD rjw:cms D: 10/10/2012 12:53:07 ET T: 10/10/2012 13:42:10 ET JOB#: [id number]  cc: Tana Conch. Leslye Peer, MD, <Dictator> Venia Carbon, MD Marisue Brooklyn MD ELECTRONICALLY SIGNED 10/11/2012 20:23

## 2015-03-22 NOTE — Consult Note (Signed)
Chief Complaint:   Subjective/Chief Complaint No complaints. Having bowel movements.  Abdomen not distended. Impression: Ileus resolved.  Recommendations: High fiber diet. Will sign off. Thanks.   Electronic Signatures: Jill Side (MD)  (Signed 11-Nov-13 11:49)  Authored: Chief Complaint   Last Updated: 11-Nov-13 11:49 by Jill Side (MD)

## 2015-03-22 NOTE — Discharge Summary (Signed)
PATIENT NAME:  Juan Horn, Juan Horn MR#:  [id number] DATE OF BIRTH:  January 06, 1933  DATE OF ADMISSION:  10/10/2012 DATE OF DISCHARGE:  10/13/2012  ADMITTING DIAGNOSIS: Shortness of breath.   DISCHARGE DIAGNOSES:  1. Acute respiratory failure due to acute pulmonary emboli, now his shortness of breath is mostly resolved.  2. Abdominal distention due to postoperative ileus. Patient having bowel movements. His symptoms have improved.  3. Recent status post knee replacement.  4. Macular degeneration.  5. History of prostate cancer, status post prostatectomy.  6. Hyperlipidemia.  7. Depression.  8. Status post hernia repair x2.  9. Status post prostatectomy.  10. History of varicose veins on the right leg, removed.   11. Status post tonsillectomy.   CONSULTANTS:  1. Dr. Skip Estimable.  2. Dr. Dionne Milo of GI.   LABORATORY, DIAGNOSTIC AND RADIOLOGICAL DATA: Pertinent labs, evaluations: Troponin was negative on admission, glucose 132, BUN 12, creatinine 1.12, sodium 136, potassium 3.5, chloride 97, CO2 30, calcium 8.5. LFTs: Total bilirubin was 1.4, AST 13. WBC count 13.2, hemoglobin 13.7, platelet count 199. D-dimer 3.12. Chest x-ray showed decreased lung volumes bilaterally, especially on the right. CT scan of the chest and abdomen and pelvis show pulmonary embolism, proximal left upper pulmonary artery. No thoracic aneurysm or abdominal aortic aneurysm. No dissection. Atelectasis of the right lower lobe and middle lobe, air filled loops of colon with small air-fluid levels without transitional area.   HOSPITAL COURSE: Please refer to history and physical done by the admitting physician. Patient is a 81 year old white male who recently had a left total knee replacement Dr. Marry Guan on 10/06/2012. He was discharged on 11/07. Patient presented with acute onset of shortness of breath as well as abdominal distention. Patient was evaluated in the ED and was diagnosed with acute pulmonary embolism involving the  left upper pulmonary artery. Patient was started on a heparin drip and subsequently treated with Coumadin. His INR today is 2.3. Since his INR is therapeutic his Coumadin has been discontinued. Patient's shortness of breath has significantly improved. He was ambulated today and his sats were 96% with ambulation. He also had an echocardiogram of the heart which did show impaired left ventricular relaxation and there was some elevated right ventricular systolic pressures at 30 to 40 mmHg. Patient also had complaints of having abdominal distention. He was thought to have postoperative ileus. He was seen by GI. Patient was conservatively treated due to him continuing to have bowel movements and passing gas. Patient's distention is significantly improved as he ambulates more and gets around this should completely normalize. At this time he is doing much better and is interested in going home. He was previously discharged to a rehabilitation at [facility] but now he wants to go stay at is place which is at [facility] independent living.   DISCHARGE MEDICATIONS:  1. Pravastatin 20 at bedtime.  2. Vitamin D3 1000 international units 1 tab p.o. b.i.d.  3. Aspirin 81, 1 tab p.o. daily.  4. PreserVision  2 tabs 2 times daily. 5. Citalopram 10 daily.  6. Tylenol 650 q.4 p.r.n.  7. Percocet 325/5, 1 tab p.o. q.6 p.r.n. pain.  8. MiraLax 17 grams daily.  9. Coumadin 6 mg daily.  10. KCL 20 mEq 1 tab p.o. daily for the next three days.   HOME HEALTH: Yes as well as physical therapy has been arranged.   DIET: Low sodium, regular consistency.   ACTIVITY: As tolerated.   TIMEFRAME FOR FOLLOW UP: 1 to 2  weeks with Dr. Silvio Pate. Patient is to have INR checked in two days, results to Dr. Silvio Pate. I have called his office and they are in agreement to follow his Coumadin.     TIME SPENT: 45 minutes.   ____________________________ Lafonda Mosses Posey Pronto, MD shp:cms D: 10/13/2012 11:06:22 ET T: 10/13/2012 13:21:40  ET JOB#: [id number]  cc: Reola Buckles H. Posey Pronto, MD, <Dictator> Venia Carbon, MD  Alric Seton MD ELECTRONICALLY SIGNED 10/16/2012 13:31

## 2015-03-22 NOTE — Consult Note (Signed)
Chief Complaint:   Subjective/Chief Complaint Patient is gone for x-rays. According to wife, he is doing better with less abdominal distension. He has been having bowel movements and passing gas.  Will re-evaluate later and review abdominal x-rays. Will follow.   Electronic Signatures: Jill Side (MD)  (Signed [phone] 10:41)  Authored: Chief Complaint   Last Updated: 10-Nov-13 10:41 by Jill Side (MD)

## 2015-03-22 NOTE — Consult Note (Signed)
Chief Complaint:   Subjective/Chief Complaint Patient seen. Abdomen is much less distended and soft. Will review xrays. Continue present Rx.   Electronic Signatures: Jill Side (MD)  (Signed [phone] 11:02)  Authored: Chief Complaint   Last Updated: 10-Nov-13 11:02 by Jill Side (MD)

## 2015-03-22 NOTE — Consult Note (Signed)
Brief Consult Note: Diagnosis: Probable post op ileus.   Patient was seen by consultant.   Consult note dictated.   Comments: Probable post op ileus and less likely partial mechanical SBO.  Recommendations: Liquid diet. Avoid narcotics. Out of bed and ambulate as much as alllowed. Stool for C. diff.  Will follow.  Electronic Signatures: Jill Side (MD)  (Signed [phone] 12:13)  Authored: Brief Consult Note   Last Updated: 09-Nov-13 12:13 by Jill Side (MD)

## 2015-03-22 NOTE — Op Note (Signed)
PATIENT NAME:  Juan Horn, GRANHOLM MR#:  [id number] DATE OF BIRTH:  [birth year]  DATE OF PROCEDURE:  10/06/2012  PREOPERATIVE DIAGNOSIS: Degenerative arthrosis of the left knee.   POSTOPERATIVE DIAGNOSIS: Degenerative arthrosis of the left knee.   PROCEDURE PERFORMED: Left total knee arthroplasty using computer-assisted navigation.   SURGEON: Laurice Record. Holley Bouche., MD  ASSISTANT: Vance Peper, PA-C (required to maintain retraction throughout the procedure)   ANESTHESIA: Femoral nerve block and spinal.   ESTIMATED BLOOD LOSS: 50 mL.   FLUIDS REPLACED: 2000 mL of crystalloid.   TOURNIQUET TIME: 80 minutes.   DRAINS: Two medium drains to reinfusion system.   SOFT TISSUE RELEASES: Anterior cruciate ligament, posterior cruciate ligament, deep medial collateral ligament, and patellofemoral ligament.   IMPLANTS UTILIZED: DePuy PFC Sigma size 4 posterior stabilized femoral component (cemented), size 5 MBT tibial component (cemented), 38 mm three peg oval dome patella (cemented), and a 10 mm stabilized rotating platform polyethylene insert.   INDICATIONS FOR SURGERY: The patient is a 81 year old male who has been seen for complaints of progressive left knee pain. X-rays demonstrated severe degenerative changes in tricompartmental fashion with relative varus deformity. After discussion of the risks and benefits of surgical intervention, the patient expressed understanding of the risks, benefits, and agreed with plans for surgical intervention.   PROCEDURE IN DETAIL: Patient was brought into the Operating Room and, after adequate femoral nerve block and spinal anesthesia was achieved, tourniquet was placed on the patient's upper left thigh. Patient's left knee and leg were cleaned and prepped with alcohol and DuraPrep, draped in the usual sterile fashion. A "timeout" was performed as per usual protocol. The left lower extremity was exsanguinated using Esmarch, and tourniquet was inflated to 300 mmHg.  Anterior longitudinal incision made followed by standard mid vastus approach. A moderate effusion was evacuated. Deep fibers of the medial collateral ligament were elevated in subperiosteal fashion off the medial flare of the tibia so as to maintain a continuous soft tissue sleeve. Patella was subluxed laterally and the patellofemoral ligament was incised. Inspection of the knee demonstrated severe degenerative changes with bone on bone articulation noted medially. Prominent osteophytes were debrided using rongeur. Anterior and posterior cruciate ligaments were excised. Two 4.0 mm Schanz pins were inserted into the femur and into the tibia for attachment of the array of spheres used for computer-assisted navigation. Hip center was identified using circumduction technique. Distal landmarks were mapped using computer. Distal femur and proximal tibia were mapped using computer. Distal femoral cutting guide was positioned using computer-assisted navigation so as to achieve 5 degrees distal valgus cut. Cut was performed and verified using computer. Distal femur was sized and it was felt that a size 4 femoral component was appropriate. Size 4 cutting guide was positioned and anterior cut was performed and verified using computer. This was followed by completion of the posterior and chamfer cuts. Femoral cutting guide for central box was then positioned and central box cut was performed.   Attention was then directed to proximal tibia. Medial and lateral menisci were excised. The extramedullary tibial cutting guide was positioned using computer-assisted navigation so as to achieve a 0 degree varus valgus alignment and 0 degree posterior slope. Cut was performed and verified using computer. Proximal tibia was sized and it was felt that a size 5 tibial tray was appropriate. Tibial and femoral trials were inserted followed by insertion of a 10 mm polyethylene insert. Excellent mediolateral soft tissue balancing was  appreciated both in full extension and in flexion.  Finally, the patella was cut and prepared so as to accommodate a 38 mm three peg oval dome patella. Patellar trial was placed and the knee was placed through a range of motion with excellent patellar tracking appreciated.   Femoral trial was removed after debridement of osteophytes from the posterior condyles. The central post hole for the tibial component was reamed followed by insertion of a keel punch. Tibial trials were then removed. Cut surfaces of bone were irrigated with copious amounts of normal saline with antibiotic solution using pulsatile lavage and then suctioned dry. Polymethyl methacrylate cement was prepared in usual fashion using vacuum mixer. Cement was applied to the cut surface of proximal tibia as well as along the undersurface of a size 5 MBT tibial component. Tibial component was positioned and impacted into place. Excess cement was removed using freer elevators. Cement was then applied to the cut surface of the femur as well as along the posterior flanges of size 4 posterior stabilized femoral component. Femoral component was positioned and impacted into place. Excess cement was removed using freer elevators. A 10 mm polyethylene trial was inserted and the knee was brought into full extension with steady axial compression applied. Finally, cement was applied to the backside of a 30 mm three peg oval dome patella and patellar component was positioned and patellar clamp applied. Excess cement was removed using freer elevators.   After adequate curing of cement, tourniquet was deflated after total tourniquet time of 80 minutes. Hemostasis was achieved using electrocautery. The knee was irrigated with copious amounts of normal saline with antibiotic solution using pulsatile lavage and then suctioned dry. The knee was inspected for any residual cement debris. 30 mL of 0.25% Marcaine with epinephrine was injected along the posterior capsule. A  10 mm stabilized rotating platform polyethylene insert was inserted and the knee was placed through a range of motion. Excellent patellar tracking was appreciated and excellent mediolateral soft tissue balancing was appreciated. Two medium drains were placed in the wound bed and brought out through a separate stab incision to be attached to reinfusion system. The medial parapatellar portion of the incision was reapproximated using interrupted sutures of #1 Vicryl. Subcutaneous tissue was approximated in layers using first #0 Vicryl followed by 2-0 Vicryl. Skin was closed with skin staples. Sterile dressing was applied.   Patient tolerated procedure well. He was transported to the recovery room in stable condition.    ____________________________ Laurice Record. Holley Bouche., MD jph:cms D: 10/06/2012 22:13:19 ET T: 10/07/2012 09:51:23 ET JOB#: [id number]  cc: Laurice Record. Holley Bouche., MD, <Dictator> JAMES P Holley Bouche MD ELECTRONICALLY SIGNED 10/07/2012 20:19

## 2015-03-22 NOTE — Consult Note (Signed)
PATIENT NAME:  Juan Horn, Juan Horn MR#:  [id number] DATE OF BIRTH:  [birth year]  DATE OF CONSULTATION:  10/11/2012  REFERRING PHYSICIAN:   CONSULTING PHYSICIAN:  Jill Side, MD  PRIMARY CARE PHYSICIAN: Dr. Silvio Pate   REASON FOR CONSULTATION: Abdominal distention, probable ileus.   HISTORY OF PRESENT ILLNESS: The patient is a 81 year old male who had left total knee replacement by Dr. Marry Guan five days ago. He was discharged the day before yesterday. He developed shortness of breath overnight, came back to the Emergency Room the next day and was found to have a PE. He feels better in terms of his shortness of breath. According to him about two days after surgery he started to notice increasing abdominal distention. His abdomen was quite distended on arrival to the Emergency Room. CT scan of the abdomen and pelvis showed distention of the colon with some distention of the small bowel but no transition point and no signs of mechanical small or large bowel obstruction. The patient is having some loose bowel movements and had some loose bowel movement this morning. He is passing gas. He is complaining of abdominal distention and some discomfort. No vomiting. He seems to be tolerating a liquid diet without any problem.   PAST MEDICAL HISTORY:  1. Recent knee replacement.  2. History of macular degeneration. 3. History of prostate cancer.  4. Appendectomy.  5. Hernia repair.   ALLERGIES: Alphagan.   MEDICATIONS:  1. Aspirin 81 mg a day. 2. Celexa 10 mg a day. 3. Ibuprofen p.r.n.  4. Pravastatin. 5. Vitamin D. 6. Lovenox injections.   REVIEW OF SYSTEMS: Grossly negative.   PHYSICAL EXAMINATION:   GENERAL: Well built male who does not appear to be in any acute distress.   SKIN: Unremarkable.   VITAL SIGNS: Temperature 98.2, pulse 98, respirations 20 to 24, blood pressure 177/86.   LUNGS: Clear.   CARDIOVASCULAR: Regular rate and rhythm.   ABDOMEN: Significantly distended abdomen  which is somewhat tense on percussion. It is tympanitic on percussion as well. Bowel sounds are hyperactive and high-pitched. No significant abdominal tenderness or rebound was noted.   NEUROLOGIC: Appears to be unremarkable.   LABORATORY, DIAGNOSTIC, AND RADIOLOGICAL DATA: INR 1.1. PTT 76. Hemoglobin 12.8, white cell count 10.1, hematocrit 37.1, platelet count 203. Troponin is less than 0.02. Bilirubin is mildly elevated at 1.4, AST 43. The rest of the liver enzymes are normal.   CT scan of the abdomen and pelvis was done which showed air-filled loops of colon with some small air-fluid levels without a transition area and no definite evidence of bowel obstruction according to the report.   Abdominal x-rays were done today which also showed gaseous distention of the small bowel and colon without definite air-fluid levels and that was consistent with ileus according to the radiologist.   ASSESSMENT AND PLAN: The patient is with what appears to be postoperative ileus although high-pitched hyperactive bowel sounds raising some concern about possible partial small bowel obstruction. The patient is passing gas and is having some liquid bowel movements and, therefore, a complete small bowel obstruction is unlikely. His abdominal examination is otherwise benign. I will continue conservative management with liquid diet. Minimize use of narcotics. IV hydration. Ambulation as much as possible. Further recommendations to follow depending on the patient's hospital course over the next 24 hours or so.   ____________________________ Jill Side, MD si:drc D: 10/11/2012 12:29:54 ET T: 10/11/2012 13:32:13 ET JOB#: [id number]  cc: Jill Side, MD, <Dictator> Jill Side MD ELECTRONICALLY SIGNED  10/15/2012 9:48 

## 2015-03-22 NOTE — Discharge Summary (Signed)
PATIENT NAME:  Juan Horn, Juan Horn MR#:  [id number] DATE OF BIRTH:  02/21/1933  DATE OF ADMISSION:  10/06/2012 DATE OF DISCHARGE:  10/09/2012  ADMITTING DIAGNOSIS: Degenerative arthrosis of the left knee.   DISCHARGE DIAGNOSIS: Degenerative arthrosis of the right knee.   HISTORY: The patient is a 81 year old who has been followed at [facility] for progression of left knee pain. The patient had reported a several-year history of progressive left knee pain. His pain was noted to be aggravated with weightbearing activities, stair ambulation, as well as ambulation on uneven surfaces. The patient localized most of the pain along the medial aspect of the knee. He denied any significant swelling, locking, or giving way of the knee. The patient had been treated conservatively by Dr. Lemmie Evens? at [facility] surgical clinic with activity modification, low-impact exercise, and intraarticular cortisone injections. At the time of surgery, he was not using any type of ambulatory aid.  He states that the pain had increased to the point that it was progressively interfering with his activities of daily living. X-rays taken in [place] showed narrowing of the medial cartilage space with associated varus alignment. He was noted to have some subchondral sclerosis. After discussion of the risks and benefits of surgical intervention, the patient expressed his understanding of the risks and benefits and agreed with plans for surgical intervention.   PROCEDURE: Left total knee arthroplasty using computer-assisted navigation.   ANESTHESIA: Femoral nerve block with spinal.   SOFT TISSUE RELEASE: Anterior cruciate ligament, posterior cruciate ligament, deep and medial collateral ligaments as well as the patellofemoral ligament.   IMPLANTS UTILIZED: DePuy PFC Sigma size four posterior stabilized femoral component (cemented), size five MBT tibial component (cemented), 38-mm three peg oval dome patella  (cemented), and a 10-mm stabilized rotating platform polyethylene insert.   HOSPITAL COURSE: The patient tolerated the procedure very well. He had no complications. He was then taken to the PAC-U where he was stabilized and then transferred to the orthopedic floor. The patient began receiving anticoagulation therapy of Lovenox 30 mg subcutaneous every 12 hours per anesthesia and pharmacy protocol. He was fitted with TED stockings bilaterally. These were allowed to be removed one hour per eight-hour shift. The left one was applied on day two following removal of the Hemovac and dressing change. He was also fitted with the AV-I compression foot pumps bilaterally set at 80 mmHg. His calves have been nontender. There has been no evidence of any deep venous thromboses to the lower extremity. His heels were elevated off the bed using rolled towels.   The patient has denied any chest pain or shortness of breath. Vital signs have been stable. He has been afebrile. Hemodynamically he was stable and no transfusions were given other than the Autovac transfusion given the first six hours postoperatively. Laboratory studies were within normal limits except on day two his potassium did drop to 3.4 and subsequently he was placed on 20 mEq of potassium b.i.d. This allowed his potassium level to return to a normal level.   Physical therapy was initiated on day one for gait training and transfers. He has done well. Upon being discharged he was ambulating 100 feet.  He did have range of motion of 85 degrees of flexion with full extension. Occupational therapy was also initiated on day one for activities of daily living and assistive devices.   The patient's IV, Foley, and Hemovac were discontinued on day two along with a dressing change. The wound was free of any  drainage or signs of infection. Polar Care was reapplied to the surgical leg maintaining a temperature of 40 to 50 degrees Fahrenheit.   DISPOSITION: The patient is  being discharged to a skilled nursing facility in improved stable condition.   DISCHARGE INSTRUCTIONS:  1. He continue with TED stockings bilaterally. These are allowed to be removed one hour per eight-hour shift.  2. Continue Polar Care maintaining a temperature of 40 to 50 degrees Fahrenheit.  3. Physical therapy to continue with gait training and transfers, occupational therapy for activities of daily living and assistive devices.  4. He was placed on a regular diet.  5. He may weight bear as tolerated.  6. Incentive spirometer q.1 h. while awake.  7. Encourage cough and deep breathing q. 2 h. while awake.  8. He has a follow-up appointment with Vance Peper, PA on 10/21/2012 at 8:15 and with Dr. Marry Guan on 11/25/2012 at 9:15. He is to call the clinic sooner for any temperatures of 101.5 or greater or excessive bleeding.  9. He is placed on a regular diet.  10.  He will elevate heels off the bed.   DRUG ALLERGIES: Alphagan.   MEDICATIONS:  1. Tylenol ES 500 to 1000 mg q. 4 hours p.r.n. for pain or temperatures greater than one 100.4. 2. Roxicodone 5 to 10 mg q. 4 to 6 hours p.r.n.  3. Ultram 50 to 100 mg q. 4 to 6 hours p.r.n.  4. Dulcolax suppository 10 mg rectally p.r.n.  5. Milk of Magnesia 30 mL b.i.d. p.r.n.  6. Enema soapsuds if no results with milk of magnesia or Dulcolax.  7. Mylanta DS 30 mL q. 6 hours p.r.n.  8. Pantoprazole 40 mg b.i.d.  9. Senokot-S 1 tablet b.i.d.  10. PreserVision 1 tablet daily.  11. Pravachol 20 mg at bedtime.  12. Vitamin D3 1000 units q.12 hours. 13. Celexa 10 mg daily.  14. Lovenox 30 mg subcutaneous q.12 hours for 14 days, then discontinue and begin taking one 81-mg enteric-coated aspirin.   PAST MEDICAL HISTORY:  1. Hernia.  2. Prostate cancer.  3. Hyperlipidemia.  4. Diverticulosis.  5. Lumbar spinal stenosis.  6. Colonic polyps.  7. Glaucoma.  8. Macular degeneration.  9. Anxiety.   ____________________________ Vance Peper,  PA jrw:bjt D: 10/09/2012 07:31:23 ET T: 10/09/2012 08:04:27 ET JOB#: [id number]  [place] PA ELECTRONICALLY SIGNED 10/09/2012 19:03

## 2015-04-12 ENCOUNTER — Ambulatory Visit (INDEPENDENT_AMBULATORY_CARE_PROVIDER_SITE_OTHER): Payer: Medicare Other | Admitting: Internal Medicine

## 2015-04-12 ENCOUNTER — Encounter: Payer: Self-pay | Admitting: Internal Medicine

## 2015-04-12 VITALS — BP 128/70 | HR 60 | Temp 97.7°F | Ht 67.0 in | Wt 189.0 lb

## 2015-04-12 DIAGNOSIS — Z8546 Personal history of malignant neoplasm of prostate: Secondary | ICD-10-CM | POA: Diagnosis not present

## 2015-04-12 DIAGNOSIS — E785 Hyperlipidemia, unspecified: Secondary | ICD-10-CM

## 2015-04-12 DIAGNOSIS — Z0000 Encounter for general adult medical examination without abnormal findings: Secondary | ICD-10-CM | POA: Diagnosis not present

## 2015-04-12 DIAGNOSIS — Z86010 Personal history of colonic polyps: Secondary | ICD-10-CM

## 2015-04-12 DIAGNOSIS — M48061 Spinal stenosis, lumbar region without neurogenic claudication: Secondary | ICD-10-CM

## 2015-04-12 DIAGNOSIS — M4806 Spinal stenosis, lumbar region: Secondary | ICD-10-CM

## 2015-04-12 DIAGNOSIS — Z7189 Other specified counseling: Secondary | ICD-10-CM

## 2015-04-12 LAB — CBC WITH DIFFERENTIAL/PLATELET
Basophils Absolute: 0 10*3/uL (ref 0.0–0.1)
Basophils Relative: 0.6 % (ref 0.0–3.0)
EOS PCT: 2.4 % (ref 0.0–5.0)
Eosinophils Absolute: 0.2 10*3/uL (ref 0.0–0.7)
HCT: 45.9 % (ref 39.0–52.0)
HEMOGLOBIN: 15.8 g/dL (ref 13.0–17.0)
Lymphocytes Relative: 24.9 % (ref 12.0–46.0)
Lymphs Abs: 2 10*3/uL (ref 0.7–4.0)
MCHC: 34.5 g/dL (ref 30.0–36.0)
MCV: 90.7 fl (ref 78.0–100.0)
MONOS PCT: 10 % (ref 3.0–12.0)
Monocytes Absolute: 0.8 10*3/uL (ref 0.1–1.0)
NEUTROS PCT: 62.1 % (ref 43.0–77.0)
Neutro Abs: 5 10*3/uL (ref 1.4–7.7)
Platelets: 154 10*3/uL (ref 150.0–400.0)
RBC: 5.06 Mil/uL (ref 4.22–5.81)
RDW: 13.9 % (ref 11.5–15.5)
WBC: 8.1 10*3/uL (ref 4.0–10.5)

## 2015-04-12 LAB — LIPID PANEL
Cholesterol: 170 mg/dL (ref 0–200)
HDL: 43.4 mg/dL (ref >39.00)
NONHDL: 126.6
Total CHOL/HDL Ratio: 4
Triglycerides: 271 mg/dL — ABNORMAL HIGH (ref 0.0–149.0)
VLDL: 54.2 mg/dL — ABNORMAL HIGH (ref 0.0–40.0)

## 2015-04-12 LAB — COMPREHENSIVE METABOLIC PANEL
ALBUMIN: 3.9 g/dL (ref 3.5–5.2)
ALT: 37 U/L (ref 0–53)
AST: 25 U/L (ref 0–37)
Alkaline Phosphatase: 82 U/L (ref 39–117)
BUN: 29 mg/dL — ABNORMAL HIGH (ref 6–23)
CALCIUM: 9.3 mg/dL (ref 8.4–10.5)
CHLORIDE: 101 meq/L (ref 96–112)
CO2: 31 mEq/L (ref 19–32)
Creatinine, Ser: 1.3 mg/dL (ref 0.40–1.50)
GFR: 56.46 mL/min — ABNORMAL LOW (ref >60.00)
Glucose, Bld: 120 mg/dL — ABNORMAL HIGH (ref 70–99)
POTASSIUM: 4.1 meq/L (ref 3.5–5.1)
SODIUM: 138 meq/L (ref 135–145)
Total Bilirubin: 0.9 mg/dL (ref 0.2–1.2)
Total Protein: 7 g/dL (ref 6.0–8.3)

## 2015-04-12 LAB — PSA: PSA: 0 ng/mL — ABNORMAL LOW (ref 0.10–4.00)

## 2015-04-12 LAB — LDL CHOLESTEROL, DIRECT: LDL DIRECT: 106 mg/dL

## 2015-04-12 LAB — T4, FREE: Free T4: 0.89 ng/dL (ref 0.60–1.60)

## 2015-04-12 NOTE — Assessment & Plan Note (Signed)
Still limited in walking,etc but pain is less of an issue

## 2015-04-12 NOTE — Assessment & Plan Note (Signed)
Last colonoscopy in [place] 2011 Adenomatous polyps Told to repeat in 3 years Will set up with GI here

## 2015-04-12 NOTE — Assessment & Plan Note (Signed)
Comfortable with continuing primary prevention with statin

## 2015-04-12 NOTE — Assessment & Plan Note (Signed)
Will continue to check yearly PSA for now

## 2015-04-12 NOTE — Progress Notes (Signed)
Subjective:    Patient ID: Juan Horn, male    DOB: [birth year], 81 y.o.   MRN: [id number]  HPI Here for Medicare wellness and follow up of chronic medical conditions Reviewed form and advanced directives Reviewed other physicians 1 glass of white wine daily No tobacco Does a little exercise Blind in right eye--left okay Mild hearing loss Independent with instrumental ADLs No falls No depression or anhedonia No cognitive problems  Recent visit with Dr Leanor Kail Got another knee injection-- synvisc?Marland Kitchen May be some better now Some symptoms from the spinal stenosis--but is able to get out on golf course (takes aspirin before) May be limited in his walking some days---can't walk as fast as wife  No problems with statin No myalgias No stomach trouble  No mood problems of note Some stress at times No longer uses the meds  Prostate cancer surgery 2010 Does note slow stream and some leakage at times. Hasn't started with pad yet No sig nocturia  Current Outpatient Prescriptions on File Prior to Visit  Medication Sig Dispense Refill  . Cholecalciferol (VITAMIN D-3) 1000 UNITS CAPS Take by mouth 2 (two) times daily.    Marland Kitchen latanoprost (XALATAN) 0.005 % ophthalmic solution Place 1 drop into both eyes at bedtime.    . Multiple Vitamins-Minerals (PRESERVISION AREDS 2 PO) Take 2 tablets by mouth 2 (two) times daily.    . pravastatin (PRAVACHOL) 20 MG tablet TAKE 1 TABLET DAILY 90 tablet 3   No current facility-administered medications on file prior to visit.    Allergies  Allergen Reactions  . Naphazoline-Polyethyl Glycol Other (See Comments)    (Afgan) redness    Past Medical History  Diagnosis Date  . Personal history of prostate cancer   . Hx of colonic polyp   . Diverticulosis of colon   . HLD (hyperlipidemia)   . Glaucoma   . Macular degeneration     legally blind in right eye  . Spinal stenosis of lumbar region   . OA (osteoarthritis)   . Anxiety     . Pulmonary embolism 11/13    post op TKR  . Chronic venous insufficiency     Past Surgical History  Procedure Laterality Date  . Appendectomy    . Inguinal hernia repair      left  . Prostatectomy    . Tonsillectomy    . Varicose vein surgery      left  . Inguinal hernia repair  5/12    Dr Priscille Heidelberg  . Inguinal hernia repair  5/12    Dr Jamal Collin did redo of this  . Joint replacement  11/13    Left total knee--Dr Hooten    Family History  Problem Relation Age of Onset  . Stroke Father   . Dementia Mother   . Leukemia Sister     History   Social History  . Marital Status: Married    Spouse Name: N/A  . Number of Children: 0  . Years of Education: N/A   Occupational History  . Retired-purchasing for [employer]    Social History Main Topics  . Smoking status: Former Smoker    Quit date: 12/03/1978  . Smokeless tobacco: Never Used  . Alcohol Use: Yes     Comment: Wine-several times per week  . Drug Use: No  . Sexual Activity: Not on file   Other Topics Concern  . Not on file   Social History Narrative   Has living will   DNR done  11/12--but he is somewhat on the fence about it   Wife is health care POA.   No feeding tube if cognitively unaware   Review of Systems Bowels are fine Sleeps well in general--but will awaken if "something on my mind" Appetite is great Weight down just a few pounds No skin problems--sees Dr Nicole Kindred and did do a skin peel on face Teeth okay--keeps up with the dentist Wears seat belt    Objective:   Physical Exam  Constitutional: He is oriented to person, place, and time. He appears well-developed and well-nourished. No distress.  HENT:  Mouth/Throat: Oropharynx is clear and moist. No oropharyngeal exudate.  Neck: Normal range of motion. Neck supple. No thyromegaly present.  Cardiovascular: Normal rate, regular rhythm, normal heart sounds and intact distal pulses.  Exam reveals no gallop.   No murmur  heard. Pulmonary/Chest: Effort normal and breath sounds normal. No respiratory distress. He has no wheezes. He has no rales.  Abdominal: Soft. There is no tenderness.  Musculoskeletal: He exhibits no edema or tenderness.  Lymphadenopathy:    He has no cervical adenopathy.  Neurological: He is alert and oriented to person, place, and time.  President --- "Ramonita Lab, [place]" [phone] [id number] Recall 3/3  Skin: No rash noted. No erythema.  Psychiatric: He has a normal mood and affect. His behavior is normal.          Assessment & Plan:

## 2015-04-12 NOTE — Assessment & Plan Note (Signed)
I have personally reviewed the Medicare Annual Wellness questionnaire and have noted 1. The patient's medical and social history 2. Their use of alcohol, tobacco or illicit drugs 3. Their current medications and supplements 4. The patient's functional ability including ADL's, fall risks, home safety risks and hearing or visual             impairment. 5. Diet and physical activities 6. Evidence for depression or mood disorders  The patients weight, height, BMI and visual acuity have been recorded in the chart I have made referrals, counseling and provided education to the patient based review of the above and I have provided the pt with a written personalized care plan for preventive services.  I have provided you with a copy of your personalized plan for preventive services. Please take the time to review along with your updated medication list.  Will set up colonoscopy UTD on imms--- needs yearly flu shot Trying to increase exercise

## 2015-04-12 NOTE — Progress Notes (Signed)
Pre visit review using our clinic review tool, if applicable. No additional management support is needed unless otherwise documented below in the visit note. 

## 2015-04-12 NOTE — Assessment & Plan Note (Signed)
Has DNR 

## 2015-04-15 ENCOUNTER — Telehealth: Payer: Self-pay | Admitting: Internal Medicine

## 2015-04-15 ENCOUNTER — Encounter: Payer: Self-pay | Admitting: Internal Medicine

## 2015-04-15 NOTE — Telephone Encounter (Signed)
I have reviewed the previous colonoscopy report 06/22/2010 in [place] prep. Technically difficult with looping. Cecal polyp removed and found to be tubulovillous adenoma. Appropriate for follow-up. Schedule him for a previsit and subsequent colonoscopy in the [place] [facility]. Thank you

## 2015-05-03 DIAGNOSIS — M1711 Unilateral primary osteoarthritis, right knee: Secondary | ICD-10-CM | POA: Diagnosis not present

## 2015-05-23 ENCOUNTER — Other Ambulatory Visit: Payer: Self-pay | Admitting: Internal Medicine

## 2015-05-23 DIAGNOSIS — H3532 Exudative age-related macular degeneration: Secondary | ICD-10-CM | POA: Diagnosis not present

## 2015-05-31 ENCOUNTER — Encounter: Payer: Self-pay | Admitting: Podiatry

## 2015-05-31 ENCOUNTER — Ambulatory Visit (INDEPENDENT_AMBULATORY_CARE_PROVIDER_SITE_OTHER): Payer: Medicare Other | Admitting: Podiatry

## 2015-05-31 VITALS — BP 119/62 | HR 67 | Resp 17 | Wt 189.0 lb

## 2015-05-31 DIAGNOSIS — L601 Onycholysis: Secondary | ICD-10-CM

## 2015-05-31 DIAGNOSIS — L603 Nail dystrophy: Secondary | ICD-10-CM | POA: Diagnosis not present

## 2015-05-31 DIAGNOSIS — M79676 Pain in unspecified toe(s): Secondary | ICD-10-CM | POA: Diagnosis not present

## 2015-05-31 DIAGNOSIS — B351 Tinea unguium: Secondary | ICD-10-CM

## 2015-05-31 MED ORDER — CEPHALEXIN 500 MG PO CAPS: 500.0000 mg | ORAL_CAPSULE | Freq: Three times a day (TID) | ORAL | Status: DC

## 2015-05-31 NOTE — Patient Instructions (Signed)

## 2015-06-02 ENCOUNTER — Encounter: Payer: Self-pay | Admitting: Podiatry

## 2015-06-02 NOTE — Progress Notes (Signed)
Subjective:     Patient ID: Juan Horn, male   DOB: Jul 31, 1933, 81 y.o.   MRN: [id number]  HPI 81 year old male presents the office today with concerns of right big toenail pain which has been ongoing for several weeks. He states he has pain to the area particularly with pressure in certain shoe gear. He also states that in bed when he has a sheet overlying the toenail and hurts if it pulls on the area. He also states that it'll occasionally get red however he'll soak in Epson salts and it will resolve. He does of this somewhat red at today's appointment however he denies any drainage or purulence. He'll try to trim the toenail without any relief. No other complaints at this time.  Review of Systems  All other systems reviewed and are negative.      Objective:   Physical Exam AAO x3, NAD DP/PT pulses palpable bilaterally, CRT less than 3 seconds Protective sensation intact with Simms Weinstein monofilament, vibratory sensation intact, Achilles tendon reflex intact Nails are hypertrophic, dystrophic, brittle, discolored. The right hallux toenail seems to be the worse. The right hallux toenails not adhered to the nailbed distally however is only and here proximally. There is tenderness to palpation around the entire toenail. There is mild erythema and edema along the nail border both medially and proximally. There is no ascending cellulitis, fluctuance, crepitus, drainage, malodor. There is no tenderness the remaining nails swellingerythemaordrainageontheothernailsites. No other areas of tenderness to bilateral lower extremities. MMT 5/5, ROM WNL.  No open lesions or pre-ulcerative lesions.  No overlying edema, erythema, increase in warmth to bilateral lower extremities.  No pain with calf compression, swelling, warmth, erythema bilaterally.       Assessment:     81 year old male with symptomatically right hallux toenail, onycholysis, onychomycosis    Plan:     -Treatment  options discussed including all alternatives, risks, and complications -I discussed the patient total nail avulsion versus nail debridement. He would proceed with total nail avulsion with chemical matricectomy to help prevent reoccurrence of the toenail. He understands after this he would likely not have a toenail. -At this time, the patient is requesting total nail removal with chemical matricectomy. Risks and complications were discussed with the patient for which they understand and  verbally consent to the procedure. Under sterile conditions a total of 3 mL of a mixture of 2% lidocaine plain and 0.5% Marcaine plain was infiltrated in a hallux block fashion. Once anesthetized, the skin was prepped in sterile fashion. A tourniquet was then applied. Next the right hallux was excised making sure to remove the entire offending nail borders. Once the nails were ensured to be removed area was debrided and the underlying skin was intact. There is no purulence identified in the procedure. Next phenol was then applied under standard conditions and copiously irrigated. Silvadene was applied. A dry sterile dressing was applied. After application of the dressing the tourniquet was removed and there is found to be an immediate capillary refill time to the digit. The patient tolerated the procedure well any complications. Post procedure instructions were discussed the patient for which he verbally understood. Follow-up in one week for nail check or sooner if any problems are to arise. Discussed signs/symptoms of infection and directed to call the office immediately should any occur or go directly to the emergency room. In the meantime, encouraged to call the office with any questions, concerns, changes symptoms. -Rx Keflex  Celesta Gentile, DPM

## 2015-06-08 ENCOUNTER — Ambulatory Visit (AMBULATORY_SURGERY_CENTER): Payer: Self-pay

## 2015-06-08 VITALS — Ht 67.0 in | Wt 191.0 lb

## 2015-06-08 DIAGNOSIS — Z86010 Personal history of colonic polyps: Secondary | ICD-10-CM

## 2015-06-08 MED ORDER — NA SULFATE-K SULFATE-MG SULF 17.5-3.13-1.6 GM/177ML PO SOLN: 1.0000 | Freq: Once | ORAL | Status: DC

## 2015-06-08 NOTE — Progress Notes (Signed)
No anesthesia complications Not on home 02 No diet drugs No egg or soy allergies

## 2015-06-10 ENCOUNTER — Ambulatory Visit (INDEPENDENT_AMBULATORY_CARE_PROVIDER_SITE_OTHER): Payer: Medicare Other | Admitting: Podiatry

## 2015-06-10 DIAGNOSIS — L601 Onycholysis: Secondary | ICD-10-CM

## 2015-06-10 NOTE — Progress Notes (Signed)
He presents today for follow-up of his matrixectomy hallux left. States that he has not been soaking as instructed. He states the toe does feel better. However it is still quite sensitive on the very tip.  Objective: Vital signs are stable alert 3 mild erythema surrounding the matrixectomy sites. No purulence no malodor.  Assessment: Status post matrixectomy 1 week hallux left.  Plan: Encouraged him to start soaking in Epsom salts and warm water continue antibiotically ointment covered in the day and leave open at night. He is to continue segment to completely resolve or until he follows up with Dr. Earleen Newport.

## 2015-06-13 DIAGNOSIS — L905 Scar conditions and fibrosis of skin: Secondary | ICD-10-CM | POA: Diagnosis not present

## 2015-06-13 DIAGNOSIS — L570 Actinic keratosis: Secondary | ICD-10-CM | POA: Diagnosis not present

## 2015-06-13 DIAGNOSIS — L578 Other skin changes due to chronic exposure to nonionizing radiation: Secondary | ICD-10-CM | POA: Diagnosis not present

## 2015-06-13 DIAGNOSIS — Z85828 Personal history of other malignant neoplasm of skin: Secondary | ICD-10-CM | POA: Diagnosis not present

## 2015-06-16 ENCOUNTER — Ambulatory Visit: Payer: Medicare Other | Admitting: Podiatry

## 2015-06-22 ENCOUNTER — Ambulatory Visit (AMBULATORY_SURGERY_CENTER): Payer: Medicare Other | Admitting: Internal Medicine

## 2015-06-22 ENCOUNTER — Encounter: Payer: Self-pay | Admitting: Internal Medicine

## 2015-06-22 VITALS — BP 130/85 | HR 60 | Temp 97.1°F | Resp 18 | Ht 67.0 in | Wt 191.0 lb

## 2015-06-22 DIAGNOSIS — Z86010 Personal history of colonic polyps: Secondary | ICD-10-CM | POA: Diagnosis not present

## 2015-06-22 MED ORDER — SODIUM CHLORIDE 0.9 % IV SOLN: 500.0000 mL | INTRAVENOUS | Status: DC

## 2015-06-22 NOTE — Patient Instructions (Signed)
YOU HAD AN ENDOSCOPIC PROCEDURE TODAY AT THE [facility]:   Refer to the procedure report that was given to you for any specific questions about what was found during the examination.  If the procedure report does not answer your questions, please call your gastroenterologist to clarify.  If you requested that your care partner not be given the details of your procedure findings, then the procedure report has been included in a sealed envelope for you to review at your convenience later.  YOU SHOULD EXPECT: Some feelings of bloating in the abdomen. Passage of more gas than usual.  Walking can help get rid of the air that was put into your GI tract during the procedure and reduce the bloating. If you had a lower endoscopy (such as a colonoscopy or flexible sigmoidoscopy) you may notice spotting of blood in your stool or on the toilet paper. If you underwent a bowel prep for your procedure, you may not have a normal bowel movement for a few days.  Please Note:  You might notice some irritation and congestion in your nose or some drainage.  This is from the oxygen used during your procedure.  There is no need for concern and it should clear up in a day or so.  SYMPTOMS TO REPORT IMMEDIATELY:   Following lower endoscopy (colonoscopy or flexible sigmoidoscopy):  Excessive amounts of blood in the stool  Significant tenderness or worsening of abdominal pains  Swelling of the abdomen that is new, acute  Fever of 100F or higher   For urgent or emergent issues, a gastroenterologist can be reached at any hour by calling [phone].   DIET: Your first meal following the procedure should be a small meal and then it is ok to progress to your normal diet. Heavy or fried foods are harder to digest and may make you feel nauseous or bloated.  Likewise, meals heavy in dairy and vegetables can increase bloating.  Drink plenty of fluids but you should avoid alcoholic beverages for 24  hours.  ACTIVITY:  You should plan to take it easy for the rest of today and you should NOT DRIVE or use heavy machinery until tomorrow (because of the sedation medicines used during the test).    FOLLOW UP: Our staff will call the number listed on your records the next business day following your procedure to check on you and address any questions or concerns that you may have regarding the information given to you following your procedure. If we do not reach you, we will leave a message.  However, if you are feeling well and you are not experiencing any problems, there is no need to return our call.  We will assume that you have returned to your regular daily activities without incident.  If any biopsies were taken you will be contacted by phone or by letter within the next 1-3 weeks.  Please call us at [phone] if you have not heard about the biopsies in 3 weeks.    SIGNATURES/CONFIDENTIALITY: You and/or your care partner have signed paperwork which will be entered into your electronic medical record.  These signatures attest to the fact that that the information above on your After Visit Summary has been reviewed and is understood.  Full responsibility of the confidentiality of this discharge information lies with you and/or your care-partner. 

## 2015-06-22 NOTE — Op Note (Signed)
[place]  [address] [place], [id number]   COLONOSCOPY PROCEDURE REPORT  PATIENT: Juan Horn, Juan Horn  MR#: [id number] BIRTHDATE: 26-Jul-1933 , 82  yrs. old GENDER: male ENDOSCOPIST: Eustace Quail, MD REFERRED [id number] Silvio Pate, M.D. PROCEDURE DATE:  06/22/2015 PROCEDURE:   Colonoscopy, surveillance First Screening Colonoscopy - Avg.  risk and is 50 yrs.  old or older - No.  Prior Negative Screening - Now for repeat screening. N/A  History of Adenoma - Now for follow-up colonoscopy & has been > or = to 3 yrs.  Yes hx of adenoma.  Has been 3 or more years since last colonoscopy.  Polyps removed today? No Recommend repeat exam, <10 yrs? No Polyps removed today? No ASA CLASS:   Class II INDICATIONS:Surveillance due to prior colonic neoplasia and PH Colon Adenoma. . The patient underwent colonoscopy July 2011 in [place]. The examination was technically difficult but complete. The colonic preparation was described as fair. 28 mm polyps were removed in the ascending colon and cecum respectively. Pathology revealed fragments of tubulovillous adenoma. Follow-up at this time is recommended. MEDICATIONS: Monitored anesthesia care and Propofol 250 mg IV  DESCRIPTION OF PROCEDURE:   After the risks benefits and alternatives of the procedure were thoroughly explained, informed consent was obtained.  The digital rectal exam revealed no abnormalities of the rectum.   The [id number]  endoscope was introduced through the anus and advanced to the ascending colon. No adverse events experienced.   Limited by a redundant colon.   Limited by a tortuous colon.   The quality of the prep was excellent.  (Suprep was used)  The instrument was then slowly withdrawn as the colon was fully examined. Estimated blood loss is zero unless otherwise noted in this procedure report.   COLON FINDINGS: The colonoscope was advanced to the level of the distal ascending  colon. Colon was redundant and somewhat tortuous with significant looping. Despite moving the patient in multiple positions and applying abdominal pressure, the scope was unable to be advanced more proximal. Careful examination of the visualized colon revealed no polypsThere was moderate diverticulosis noted in the sigmoid colon.   The examination was otherwise normal. Retroflexed views revealed internal hemorrhoids. The time to cecum = 18.7 Withdrawal time = 6.4   The scope was withdrawn and the procedure completed. COMPLICATIONS: There were no immediate complications.  ENDOSCOPIC IMPRESSION: 1.   Moderate diverticulosis was noted in the sigmoid colon 2.   The examination was otherwise normal to the distal ascending colon.  RECOMMENDATIONS: 1. As the previous polyps were advanced lesions and located in the proximal on visualized portion of the colon, recommend virtual colonoscopy to evaluate this area"incomplete colonoscopy, history of polyps, evaluate". My nurse will arrange  eSigned:  Eustace Quail, MD 06/22/2015 12:58 PM   cc: The Patient and Venia Carbon, MD

## 2015-06-23 ENCOUNTER — Telehealth: Payer: Self-pay | Admitting: *Deleted

## 2015-06-23 ENCOUNTER — Encounter: Payer: Self-pay | Admitting: Internal Medicine

## 2015-06-23 NOTE — Telephone Encounter (Signed)
  Follow up Call-  Call back number 06/22/2015  Post procedure Call Back phone  # [phone]  Permission to leave phone message Yes     Patient questions:  Do you have a fever, pain , or abdominal swelling? No. Pain Score  0 *  Have you tolerated food without any problems? Yes.    Have you been able to return to your normal activities? Yes.    Do you have any questions about your discharge instructions: Diet   No. Medications  No. Follow up visit  No.  Do you have questions or concerns about your Care? No.  Actions: * If pain score is 4 or above: No action needed, pain <4.

## 2015-06-28 ENCOUNTER — Ambulatory Visit (INDEPENDENT_AMBULATORY_CARE_PROVIDER_SITE_OTHER): Payer: Medicare Other | Admitting: Podiatry

## 2015-06-28 ENCOUNTER — Encounter: Payer: Self-pay | Admitting: Podiatry

## 2015-06-28 VITALS — BP 111/60 | HR 71 | Resp 17

## 2015-06-28 DIAGNOSIS — Z9889 Other specified postprocedural states: Secondary | ICD-10-CM

## 2015-06-28 NOTE — Progress Notes (Signed)
Patient ID: Vedder Brittian, male   DOB: 11-23-1933, 81 y.o.   MRN: [id number]  Subjective: 81 year old male presents the office they status post right hallux toenail removal secondary to infection. He states that he continues to soak the foot in Epsom salts soaks intermittently. He is not covered in about ointment were Band-Aid. Denies any redness around the toenail site denies any drainage. Denies any pain associated with it. No other complaints at this time.  Objective: AAO 3, NAD Neurovascular status unchanged Right hallux toenail procedure site is healed middle scab over on the nailbed. There is no tenderness palpation overlying the area and there is no swelling erythema, ascending cellulitis, fluctuance, crepitus, malodor, drainage/purulence. There is no signs of infection. No other areas of tenderness of bilateral lower extremities. No overlying edema, erythema, increase in warmth elsewhere. No open lesions or pre-ulcerative lesions. No pain with calf compression, swelling, warmth, erythema.  Assessment: 81 year old male with healed right hallux status post total nail avulsion  Plan: At this time the area appears to be healed. Continue to monitor for recurrence. Band-aid daily to help protect the area. Monitor for any clinical signs or symptoms of infection and directed to call the office immediately should any occur or go to the ER. Follow-up as needed. Call with questions or concerns or any change in symptoms.  Celesta Gentile, DPM

## 2015-07-12 DIAGNOSIS — Z96652 Presence of left artificial knee joint: Secondary | ICD-10-CM | POA: Diagnosis not present

## 2015-07-12 DIAGNOSIS — M545 Low back pain: Secondary | ICD-10-CM | POA: Diagnosis not present

## 2015-07-12 DIAGNOSIS — M1711 Unilateral primary osteoarthritis, right knee: Secondary | ICD-10-CM | POA: Diagnosis not present

## 2015-07-13 ENCOUNTER — Other Ambulatory Visit: Payer: Self-pay | Admitting: Unknown Physician Specialty

## 2015-07-13 DIAGNOSIS — G8929 Other chronic pain: Secondary | ICD-10-CM

## 2015-07-13 DIAGNOSIS — M545 Low back pain: Principal | ICD-10-CM

## 2015-07-18 ENCOUNTER — Ambulatory Visit
Admission: RE | Admit: 2015-07-18 | Discharge: 2015-07-18 | Disposition: A | Discharge status: Home / Self Care | Payer: Medicare Other | Source: Ambulatory Visit | Attending: Unknown Physician Specialty | Admitting: Unknown Physician Specialty

## 2015-07-18 DIAGNOSIS — M5136 Other intervertebral disc degeneration, lumbar region: Secondary | ICD-10-CM | POA: Insufficient documentation

## 2015-07-18 DIAGNOSIS — M545 Low back pain, unspecified: Secondary | ICD-10-CM

## 2015-07-18 DIAGNOSIS — G8929 Other chronic pain: Secondary | ICD-10-CM

## 2015-07-18 DIAGNOSIS — M47896 Other spondylosis, lumbar region: Secondary | ICD-10-CM | POA: Diagnosis not present

## 2015-07-18 DIAGNOSIS — M5126 Other intervertebral disc displacement, lumbar region: Secondary | ICD-10-CM | POA: Diagnosis not present

## 2015-07-28 DIAGNOSIS — M4806 Spinal stenosis, lumbar region: Secondary | ICD-10-CM | POA: Diagnosis not present

## 2015-08-18 DIAGNOSIS — M4806 Spinal stenosis, lumbar region: Secondary | ICD-10-CM | POA: Diagnosis not present

## 2015-08-18 DIAGNOSIS — M5136 Other intervertebral disc degeneration, lumbar region: Secondary | ICD-10-CM | POA: Diagnosis not present

## 2015-08-18 DIAGNOSIS — M5416 Radiculopathy, lumbar region: Secondary | ICD-10-CM | POA: Diagnosis not present

## 2015-09-06 DIAGNOSIS — M1711 Unilateral primary osteoarthritis, right knee: Secondary | ICD-10-CM | POA: Diagnosis not present

## 2015-09-12 DIAGNOSIS — H401134 Primary open-angle glaucoma, bilateral, indeterminate stage: Secondary | ICD-10-CM | POA: Diagnosis not present

## 2015-09-19 DIAGNOSIS — H401134 Primary open-angle glaucoma, bilateral, indeterminate stage: Secondary | ICD-10-CM | POA: Diagnosis not present

## 2015-09-22 DIAGNOSIS — M5136 Other intervertebral disc degeneration, lumbar region: Secondary | ICD-10-CM | POA: Diagnosis not present

## 2015-09-22 DIAGNOSIS — M4806 Spinal stenosis, lumbar region: Secondary | ICD-10-CM | POA: Diagnosis not present

## 2015-09-22 DIAGNOSIS — M5416 Radiculopathy, lumbar region: Secondary | ICD-10-CM | POA: Diagnosis not present

## 2015-09-29 DIAGNOSIS — Z23 Encounter for immunization: Secondary | ICD-10-CM | POA: Diagnosis not present

## 2015-11-14 DIAGNOSIS — H353212 Exudative age-related macular degeneration, right eye, with inactive choroidal neovascularization: Secondary | ICD-10-CM | POA: Diagnosis not present

## 2015-12-20 DIAGNOSIS — M4806 Spinal stenosis, lumbar region: Secondary | ICD-10-CM | POA: Diagnosis not present

## 2015-12-20 DIAGNOSIS — M5136 Other intervertebral disc degeneration, lumbar region: Secondary | ICD-10-CM | POA: Insufficient documentation

## 2015-12-20 DIAGNOSIS — M5416 Radiculopathy, lumbar region: Secondary | ICD-10-CM | POA: Diagnosis not present

## 2016-01-31 ENCOUNTER — Other Ambulatory Visit: Payer: Self-pay | Admitting: Internal Medicine

## 2016-01-31 ENCOUNTER — Encounter: Payer: Self-pay | Admitting: Internal Medicine

## 2016-01-31 ENCOUNTER — Ambulatory Visit (INDEPENDENT_AMBULATORY_CARE_PROVIDER_SITE_OTHER)
Admission: RE | Admit: 2016-01-31 | Discharge: 2016-01-31 | Disposition: A | Discharge status: Home / Self Care | Payer: Medicare Other | Source: Ambulatory Visit | Attending: Internal Medicine | Admitting: Internal Medicine

## 2016-01-31 ENCOUNTER — Ambulatory Visit (INDEPENDENT_AMBULATORY_CARE_PROVIDER_SITE_OTHER): Payer: Medicare Other | Admitting: Internal Medicine

## 2016-01-31 ENCOUNTER — Encounter: Payer: Self-pay | Admitting: *Deleted

## 2016-01-31 VITALS — BP 118/68 | HR 78 | Temp 97.8°F | Resp 18 | Wt 193.0 lb

## 2016-01-31 DIAGNOSIS — R05 Cough: Secondary | ICD-10-CM | POA: Insufficient documentation

## 2016-01-31 DIAGNOSIS — R509 Fever, unspecified: Secondary | ICD-10-CM | POA: Diagnosis not present

## 2016-01-31 DIAGNOSIS — R9389 Abnormal findings on diagnostic imaging of other specified body structures: Secondary | ICD-10-CM

## 2016-01-31 DIAGNOSIS — R059 Cough, unspecified: Secondary | ICD-10-CM

## 2016-01-31 NOTE — Assessment & Plan Note (Addendum)
Goes back a month without preceding illness Seems so better in the past few days Was smoker so will check CXR---especially since exam suggests right effusion or elevated hemidiaphragm  CXR does show elevated hemidiaphragm Some atelectasis that doesn't look worrisome Will await overread and contact him if action indicated

## 2016-01-31 NOTE — Progress Notes (Signed)
Subjective:    Patient ID: Juan Horn, male    DOB: 12/13/1932, 82 y.o.   MRN: [id number]  HPI Here due to cough Has had a cough for a month or so---anytime he would lie down Doesn't remember any illness preceding this No clear cut drainage No ear pain or sore throat No fever No SOB  Mostly mid afternoon to evening Hasn't used any meds--- just try some water at night (not helpful) Cough seems better in the past few days  Current Outpatient Prescriptions on File Prior to Visit  Medication Sig Dispense Refill  . aspirin 81 MG tablet Take 81 mg by mouth daily.    . Cholecalciferol (VITAMIN D-3) 1000 UNITS CAPS Take by mouth 2 (two) times daily.    Marland Kitchen latanoprost (XALATAN) 0.005 % ophthalmic solution Place 1 drop into both eyes at bedtime.    . Multiple Vitamins-Minerals (PRESERVISION AREDS 2 PO) Take 2 tablets by mouth 2 (two) times daily.    . pravastatin (PRAVACHOL) 20 MG tablet TAKE 1 TABLET DAILY 90 tablet 2   No current facility-administered medications on file prior to visit.    Allergies  Allergen Reactions  . Naphazoline-Polyethyl Glycol Other (See Comments)    (Afgan) redness    Past Medical History  Diagnosis Date  . Personal history of prostate cancer   . Hx of colonic polyp   . Diverticulosis of colon   . HLD (hyperlipidemia)   . Glaucoma   . Macular degeneration     legally blind in right eye  . Spinal stenosis of lumbar region   . OA (osteoarthritis)   . Anxiety   . Pulmonary embolism ([place]) 11/13    post op TKR  . Chronic venous insufficiency   . Cataract     left eye  . Clotting disorder ([place]) 2013    blood clot 3 days post knee surgery    Past Surgical History  Procedure Laterality Date  . Appendectomy    . Inguinal hernia repair      left  . Prostatectomy  2010  . Tonsillectomy    . Varicose vein surgery      left  . Inguinal hernia repair  5/12    Dr Priscille Heidelberg  . Inguinal hernia repair  5/12    Dr Jamal Collin did redo of  this  . Joint replacement  11/13    Left total knee--Dr Hooten  . Knee surgery  2013  . Colonoscopy  2011  . Polypectomy  2011    Family History  Problem Relation Age of Onset  . Stroke Father   . Dementia Mother   . Leukemia Sister   . Colon cancer Neg Hx     Social History   Social History  . Marital Status: Married    Spouse Name: N/A  . Number of Children: 0  . Years of Education: N/A   Occupational History  . Retired-purchasing for [employer]    Social History Main Topics  . Smoking status: Former Smoker    Quit date: 12/03/1978  . Smokeless tobacco: Never Used  . Alcohol Use: 0.0 oz/week    0 Standard drinks or equivalent per week     Comment: wine nightly  . Drug Use: No  . Sexual Activity: Not on file   Other Topics Concern  . Not on file   Social History Narrative   Has living will   DNR done 11/12--but he is somewhat on the fence about it  Wife is health care POA.   No feeding tube if cognitively unaware   Review of Systems No heartburn No swallowing problems Appetite is fine    Objective:   Physical Exam  Constitutional: He appears well-developed and well-nourished. No distress.  HENT:  Mouth/Throat: Oropharynx is clear and moist. No oropharyngeal exudate.  TMs normal No sig nasal inflammation  Neck: Normal range of motion. Neck supple. No thyromegaly present.  Pulmonary/Chest: Effort normal. No respiratory distress. He has no wheezes. He has no rales.  Dullness and decreased breath sounds at right base  Lymphadenopathy:    He has no cervical adenopathy.          Assessment & Plan:

## 2016-01-31 NOTE — Progress Notes (Signed)
Pre visit review using our clinic review tool, if applicable. No additional management support is needed unless otherwise documented below in the visit note. 

## 2016-03-06 DIAGNOSIS — M5416 Radiculopathy, lumbar region: Secondary | ICD-10-CM | POA: Diagnosis not present

## 2016-03-06 DIAGNOSIS — M4806 Spinal stenosis, lumbar region: Secondary | ICD-10-CM | POA: Diagnosis not present

## 2016-03-06 DIAGNOSIS — M5136 Other intervertebral disc degeneration, lumbar region: Secondary | ICD-10-CM | POA: Diagnosis not present

## 2016-03-26 DIAGNOSIS — H353212 Exudative age-related macular degeneration, right eye, with inactive choroidal neovascularization: Secondary | ICD-10-CM | POA: Diagnosis not present

## 2016-03-26 DIAGNOSIS — H353122 Nonexudative age-related macular degeneration, left eye, intermediate dry stage: Secondary | ICD-10-CM | POA: Diagnosis not present

## 2016-04-09 DIAGNOSIS — H401134 Primary open-angle glaucoma, bilateral, indeterminate stage: Secondary | ICD-10-CM | POA: Diagnosis not present

## 2016-04-13 ENCOUNTER — Encounter: Payer: Self-pay | Admitting: Internal Medicine

## 2016-04-13 ENCOUNTER — Ambulatory Visit (INDEPENDENT_AMBULATORY_CARE_PROVIDER_SITE_OTHER): Payer: Medicare Other | Admitting: Internal Medicine

## 2016-04-13 VITALS — BP 126/80 | HR 72 | Temp 97.7°F | Ht 67.0 in | Wt 192.0 lb

## 2016-04-13 DIAGNOSIS — Z0000 Encounter for general adult medical examination without abnormal findings: Secondary | ICD-10-CM | POA: Diagnosis not present

## 2016-04-13 DIAGNOSIS — E785 Hyperlipidemia, unspecified: Secondary | ICD-10-CM | POA: Diagnosis not present

## 2016-04-13 DIAGNOSIS — Z7189 Other specified counseling: Secondary | ICD-10-CM

## 2016-04-13 DIAGNOSIS — M48061 Spinal stenosis, lumbar region without neurogenic claudication: Secondary | ICD-10-CM

## 2016-04-13 DIAGNOSIS — M4806 Spinal stenosis, lumbar region: Secondary | ICD-10-CM

## 2016-04-13 LAB — CBC WITH DIFFERENTIAL/PLATELET
Basophils Absolute: 0 10*3/uL (ref 0.0–0.1)
Basophils Relative: 0.4 % (ref 0.0–3.0)
EOS PCT: 1.3 % (ref 0.0–5.0)
Eosinophils Absolute: 0.1 10*3/uL (ref 0.0–0.7)
HCT: 42.5 % (ref 39.0–52.0)
Hemoglobin: 14.2 g/dL (ref 13.0–17.0)
LYMPHS ABS: 1.9 10*3/uL (ref 0.7–4.0)
Lymphocytes Relative: 23.4 % (ref 12.0–46.0)
MCHC: 33.3 g/dL (ref 30.0–36.0)
MCV: 89.9 fl (ref 78.0–100.0)
MONO ABS: 0.8 10*3/uL (ref 0.1–1.0)
Monocytes Relative: 10.1 % (ref 3.0–12.0)
NEUTROS PCT: 64.8 % (ref 43.0–77.0)
Neutro Abs: 5.2 10*3/uL (ref 1.4–7.7)
PLATELETS: 184 10*3/uL (ref 150.0–400.0)
RBC: 4.73 Mil/uL (ref 4.22–5.81)
RDW: 13.3 % (ref 11.5–15.5)
WBC: 8.1 10*3/uL (ref 4.0–10.5)

## 2016-04-13 LAB — LIPID PANEL
CHOLESTEROL: 167 mg/dL (ref 0–200)
HDL: 34.2 mg/dL — ABNORMAL LOW (ref >39.00)
NonHDL: 132.99
Total CHOL/HDL Ratio: 5
Triglycerides: 244 mg/dL — ABNORMAL HIGH (ref 0.0–149.0)
VLDL: 48.8 mg/dL — AB (ref 0.0–40.0)

## 2016-04-13 LAB — COMPREHENSIVE METABOLIC PANEL
ALK PHOS: 78 U/L (ref 39–117)
ALT: 32 U/L (ref 0–53)
AST: 21 U/L (ref 0–37)
Albumin: 3.9 g/dL (ref 3.5–5.2)
BILIRUBIN TOTAL: 0.7 mg/dL (ref 0.2–1.2)
BUN: 21 mg/dL (ref 6–23)
CO2: 30 mEq/L (ref 19–32)
Calcium: 9.1 mg/dL (ref 8.4–10.5)
Chloride: 101 mEq/L (ref 96–112)
Creatinine, Ser: 1.21 mg/dL (ref 0.40–1.50)
GFR: 61.18 mL/min (ref >60.00)
GLUCOSE: 87 mg/dL (ref 70–99)
POTASSIUM: 3.9 meq/L (ref 3.5–5.1)
SODIUM: 138 meq/L (ref 135–145)
TOTAL PROTEIN: 7.2 g/dL (ref 6.0–8.3)

## 2016-04-13 LAB — LDL CHOLESTEROL, DIRECT: Direct LDL: 103 mg/dL

## 2016-04-13 NOTE — Assessment & Plan Note (Signed)
This is better Still sees Dr Sharlet Salina

## 2016-04-13 NOTE — Progress Notes (Signed)
Pre visit review using our clinic review tool, if applicable. No additional management support is needed unless otherwise documented below in the visit note. 

## 2016-04-13 NOTE — Progress Notes (Signed)
Subjective:    Patient ID: Juan Horn, male    DOB: Apr 04, 1933, 82 y.o.   MRN: [id number]  HPI Here for Medicare wellness and follow up of chronic health conditions Reviewed form and advanced directives Reviewed other doctors Trying to walk or do some exercise more often---back to golf 1 glass of wine some days No tobacco Vision is okay. Mild hearing loss per wife--- nothing that bothers her No falls No depression or anhedonia Independent with instrumental ADLs No significant problems with memory  He has no new concerns Ongoing back pain Gets intermittent ESI from Dr Sharlet Salina Will occasionally take aleve or ibuprofen--not every day  Still comfortable with statin No myalgias or GI problems  No sig mood issues Will get something on his mind and it affects him  Current Outpatient Prescriptions on File Prior to Visit  Medication Sig Dispense Refill  . aspirin 81 MG tablet Take 81 mg by mouth daily.    . Cholecalciferol (VITAMIN D-3) 1000 UNITS CAPS Take by mouth 2 (two) times daily.    Marland Kitchen latanoprost (XALATAN) 0.005 % ophthalmic solution Place 1 drop into both eyes at bedtime.    . Multiple Vitamins-Minerals (PRESERVISION AREDS 2 PO) Take 2 tablets by mouth 2 (two) times daily.    . pravastatin (PRAVACHOL) 20 MG tablet TAKE 1 TABLET DAILY 90 tablet 2   No current facility-administered medications on file prior to visit.    Allergies  Allergen Reactions  . Naphazoline-Polyethyl Glycol Other (See Comments)    (Afgan) redness    Past Medical History  Diagnosis Date  . Personal history of prostate cancer   . Hx of colonic polyp   . Diverticulosis of colon   . HLD (hyperlipidemia)   . Glaucoma   . Macular degeneration     legally blind in right eye  . Spinal stenosis of lumbar region   . OA (osteoarthritis)   . Anxiety   . Pulmonary embolism ([place]) 11/13    post op TKR  . Chronic venous insufficiency   . Cataract     left eye  . Clotting disorder ([place])  2013    blood clot 3 days post knee surgery    Past Surgical History  Procedure Laterality Date  . Appendectomy    . Inguinal hernia repair      left  . Prostatectomy  2010  . Tonsillectomy    . Varicose vein surgery      left  . Inguinal hernia repair  5/12    Dr Priscille Heidelberg  . Inguinal hernia repair  5/12    Dr Jamal Collin did redo of this  . Joint replacement  11/13    Left total knee--Dr Hooten  . Knee surgery  2013  . Colonoscopy  2011  . Polypectomy  2011    Family History  Problem Relation Age of Onset  . Stroke Father   . Dementia Mother   . Leukemia Sister   . Colon cancer Neg Hx     Social History   Social History  . Marital Status: Married    Spouse Name: N/A  . Number of Children: 0  . Years of Education: N/A   Occupational History  . Retired-purchasing for [employer]    Social History Main Topics  . Smoking status: Former Smoker    Quit date: 12/03/1978  . Smokeless tobacco: Never Used  . Alcohol Use: 0.0 oz/week    0 Standard drinks or equivalent per week  Comment: wine nightly  . Drug Use: No  . Sexual Activity: Not on file   Other Topics Concern  . Not on file   Social History Narrative   Has living will   DNR done 11/12--- he doesn't have it on display though (since he is not sure about this)   Wife is health care POA--then niece Evangeline Dakin    No feeding tube if cognitively unaware   Review of Systems Appetite is fine Weight stable Sleeps well Bowels are fine--no blood in stool Wears seat belt Teeth are fine--- keeps up with dentist. No rash. Does see Dr Nicole Kindred regularly. Occasional treatment of actinics (cream in past) No sig joint pain--- only back if he is up on it for a while    Objective:   Physical Exam  Constitutional: He is oriented to person, place, and time. He appears well-developed and well-nourished. No distress.  HENT:  Mouth/Throat: Oropharynx is clear and moist. No oropharyngeal exudate.  Neck: Normal  range of motion. Neck supple. No thyromegaly present.  Cardiovascular: Normal rate, regular rhythm, normal heart sounds and intact distal pulses.  Exam reveals no gallop.   No murmur heard. Pulmonary/Chest: Effort normal and breath sounds normal. No respiratory distress. He has no wheezes. He has no rales.  Abdominal: Soft. There is no tenderness.  Musculoskeletal: He exhibits no edema or tenderness.  Lymphadenopathy:    He has no cervical adenopathy.  Neurological: He is alert and oriented to person, place, and time.  President-- "Trump, Obama, Bush, Clinton, Bush" [phone] D-[id number] Recall 3/3  Skin: No rash noted. No erythema.  Psychiatric: He has a normal mood and affect. His behavior is normal.          Assessment & Plan:

## 2016-04-13 NOTE — Assessment & Plan Note (Signed)
I have personally reviewed the Medicare Annual Wellness questionnaire and have noted 1. The patient's medical and social history 2. Their use of alcohol, tobacco or illicit drugs 3. Their current medications and supplements 4. The patient's functional ability including ADL's, fall risks, home safety risks and hearing or visual             impairment. 5. Diet and physical activities 6. Evidence for depression or mood disorders  The patients weight, height, BMI and visual acuity have been recorded in the chart I have made referrals, counseling and provided education to the patient based review of the above and I have provided the pt with a written personalized care plan for preventive services.  I have provided you with a copy of your personalized plan for preventive services. Please take the time to review along with your updated medication list.  Colon last year--no more UTD on imms--yearly flu shot Discussed increasing his exercise

## 2016-04-13 NOTE — Assessment & Plan Note (Signed)
See social history For now, he would accept resuscitation

## 2016-04-13 NOTE — Assessment & Plan Note (Signed)
Continues with primary prevention

## 2016-05-31 ENCOUNTER — Other Ambulatory Visit: Payer: Self-pay | Admitting: Internal Medicine

## 2016-06-12 DIAGNOSIS — D692 Other nonthrombocytopenic purpura: Secondary | ICD-10-CM | POA: Diagnosis not present

## 2016-06-12 DIAGNOSIS — L570 Actinic keratosis: Secondary | ICD-10-CM | POA: Diagnosis not present

## 2016-06-12 DIAGNOSIS — D229 Melanocytic nevi, unspecified: Secondary | ICD-10-CM | POA: Diagnosis not present

## 2016-06-12 DIAGNOSIS — L821 Other seborrheic keratosis: Secondary | ICD-10-CM | POA: Diagnosis not present

## 2016-06-12 DIAGNOSIS — D1800 Hemangioma unspecified site: Secondary | ICD-10-CM | POA: Diagnosis not present

## 2016-06-12 DIAGNOSIS — L905 Scar conditions and fibrosis of skin: Secondary | ICD-10-CM | POA: Diagnosis not present

## 2016-06-22 ENCOUNTER — Ambulatory Visit (INDEPENDENT_AMBULATORY_CARE_PROVIDER_SITE_OTHER): Payer: Medicare Other | Admitting: Internal Medicine

## 2016-06-22 ENCOUNTER — Encounter: Payer: Self-pay | Admitting: Internal Medicine

## 2016-06-22 VITALS — BP 116/70 | HR 64 | Temp 97.7°F | Wt 192.0 lb

## 2016-06-22 DIAGNOSIS — M1711 Unilateral primary osteoarthritis, right knee: Secondary | ICD-10-CM | POA: Diagnosis not present

## 2016-06-22 MED ORDER — METHYLPREDNISOLONE ACETATE 40 MG/ML IJ SUSP
40.0000 mg | Freq: Once | INTRAMUSCULAR | Status: AC
  Administered 2016-06-22: 40 mg via INTRAMUSCULAR

## 2016-06-22 MED ORDER — METHYLPREDNISOLONE ACETATE PF 40 MG/ML IJ SUSP: 40.0000 mg | Freq: Once | INTRAMUSCULAR | Status: DC

## 2016-06-22 NOTE — Progress Notes (Signed)
Subjective:    Patient ID: Juan Horn, male    DOB: 02/10/1933, 82 y.o.   MRN: [id number]  HPI Here due to right knee pain Wants injection  No sig swelling--just increased pain Especially walking on uneven ground (tough on golf course) Injected in distant past--Dr Jefm Bryant and [place] in past  Current Outpatient Prescriptions on File Prior to Visit  Medication Sig Dispense Refill  . aspirin 81 MG tablet Take 81 mg by mouth daily.    . Cholecalciferol (VITAMIN D-3) 1000 UNITS CAPS Take by mouth 2 (two) times daily.    Marland Kitchen latanoprost (XALATAN) 0.005 % ophthalmic solution Place 1 drop into both eyes at bedtime.    . Multiple Vitamins-Minerals (PRESERVISION AREDS 2 PO) Take 2 tablets by mouth 2 (two) times daily.    . pravastatin (PRAVACHOL) 20 MG tablet TAKE 1 TABLET DAILY 90 tablet 3  . erythromycin ophthalmic ointment Place 1 application into both eyes at bedtime. Reported on 06/22/2016  6   No current facility-administered medications on file prior to visit.    Allergies  Allergen Reactions  . Naphazoline-Polyethyl Glycol Other (See Comments)    (Afgan) redness    Past Medical History  Diagnosis Date  . Personal history of prostate cancer   . Hx of colonic polyp   . Diverticulosis of colon   . HLD (hyperlipidemia)   . Glaucoma   . Macular degeneration     legally blind in right eye  . Spinal stenosis of lumbar region   . OA (osteoarthritis)   . Anxiety   . Pulmonary embolism ([place]) 11/13    post op TKR  . Chronic venous insufficiency   . Cataract     left eye  . Clotting disorder ([place]) 2013    blood clot 3 days post knee surgery    Past Surgical History  Procedure Laterality Date  . Appendectomy    . Inguinal hernia repair      left  . Prostatectomy  2010  . Tonsillectomy    . Varicose vein surgery      left  . Inguinal hernia repair  5/12    Dr Priscille Heidelberg  . Inguinal hernia repair  5/12    Dr Jamal Collin did redo of this  . Joint replacement   11/13    Left total knee--Dr Hooten  . Knee surgery  2013  . Colonoscopy  2011  . Polypectomy  2011    Family History  Problem Relation Age of Onset  . Stroke Father   . Dementia Mother   . Leukemia Sister   . Colon cancer Neg Hx     Social History   Social History  . Marital Status: Married    Spouse Name: N/A  . Number of Children: 0  . Years of Education: N/A   Occupational History  . Retired-purchasing for [employer]    Social History Main Topics  . Smoking status: Former Smoker    Quit date: 12/03/1978  . Smokeless tobacco: Never Used  . Alcohol Use: 0.0 oz/week    0 Standard drinks or equivalent per week     Comment: wine nightly  . Drug Use: No  . Sexual Activity: Not on file   Other Topics Concern  . Not on file   Social History Narrative   Has living will   DNR done 11/12--- he doesn't have it on display though (since he is not sure about this)   Wife is health care POA--then niece Hassan Rowan  [place]    No feeding tube if cognitively unaware   Review of Systems     Objective:   Physical Exam  Musculoskeletal:  Right knee thickened without effusion          Assessment & Plan:

## 2016-06-22 NOTE — Progress Notes (Signed)
Pre visit review using our clinic review tool, if applicable. No additional management support is needed unless otherwise documented below in the visit note. 

## 2016-06-22 NOTE — Addendum Note (Signed)
Addended by: Pilar Grammes on: 06/22/2016 10:13 AM   Modules accepted: Orders, SmartSet

## 2016-06-22 NOTE — Assessment & Plan Note (Signed)
Right knee sterile prep Ethyl chloride then 2cc 1% lidocaine 40mg  depomedrol and 7cc 1% lido instilled in medial aspect of right knee Tolerated well Discussed home care

## 2016-07-25 DIAGNOSIS — M5136 Other intervertebral disc degeneration, lumbar region: Secondary | ICD-10-CM | POA: Diagnosis not present

## 2016-07-25 DIAGNOSIS — M5416 Radiculopathy, lumbar region: Secondary | ICD-10-CM | POA: Diagnosis not present

## 2016-07-25 DIAGNOSIS — M4806 Spinal stenosis, lumbar region: Secondary | ICD-10-CM | POA: Diagnosis not present

## 2016-09-06 DIAGNOSIS — Z23 Encounter for immunization: Secondary | ICD-10-CM | POA: Diagnosis not present

## 2016-10-02 DIAGNOSIS — J069 Acute upper respiratory infection, unspecified: Secondary | ICD-10-CM | POA: Diagnosis not present

## 2016-10-05 DIAGNOSIS — H353221 Exudative age-related macular degeneration, left eye, with active choroidal neovascularization: Secondary | ICD-10-CM | POA: Diagnosis not present

## 2016-10-08 DIAGNOSIS — H401134 Primary open-angle glaucoma, bilateral, indeterminate stage: Secondary | ICD-10-CM | POA: Diagnosis not present

## 2016-10-15 DIAGNOSIS — H401134 Primary open-angle glaucoma, bilateral, indeterminate stage: Secondary | ICD-10-CM | POA: Diagnosis not present

## 2016-10-22 DIAGNOSIS — L739 Follicular disorder, unspecified: Secondary | ICD-10-CM | POA: Diagnosis not present

## 2016-10-22 DIAGNOSIS — L820 Inflamed seborrheic keratosis: Secondary | ICD-10-CM | POA: Diagnosis not present

## 2016-10-22 DIAGNOSIS — L578 Other skin changes due to chronic exposure to nonionizing radiation: Secondary | ICD-10-CM | POA: Diagnosis not present

## 2016-10-22 DIAGNOSIS — L570 Actinic keratosis: Secondary | ICD-10-CM | POA: Diagnosis not present

## 2016-10-29 ENCOUNTER — Ambulatory Visit (INDEPENDENT_AMBULATORY_CARE_PROVIDER_SITE_OTHER): Payer: Medicare Other | Admitting: Internal Medicine

## 2016-10-29 ENCOUNTER — Encounter: Payer: Self-pay | Admitting: Internal Medicine

## 2016-10-29 DIAGNOSIS — M48062 Spinal stenosis, lumbar region with neurogenic claudication: Secondary | ICD-10-CM | POA: Diagnosis not present

## 2016-10-29 MED ORDER — OMEPRAZOLE 20 MG PO CPDR: 20.0000 mg | DELAYED_RELEASE_CAPSULE | Freq: Every day | ORAL | 3 refills | Status: DC

## 2016-10-29 NOTE — Patient Instructions (Signed)
I would recommend you use the ibuprofen 600mg  (3 of the over the counter pills) up to once daily.  Use the omeprazole (prilosec) daily when you take this to protect your stomach. On a bad day, you could even take it twice a day.

## 2016-10-29 NOTE — Progress Notes (Signed)
Subjective:    Patient ID: Juan Horn, male    DOB: July 05, 1933, 82 y.o.   MRN: [id number]  HPI Here with wife Ongoing back and  right knee pain---finds it is really limiting him Steroid injections in knee help--but only briefly  ESI by Dr Sharlet Salina Not really helping much though Has tried aleve--not really helpful Uses ibuprofen 600mg  --at most once a day (and not every day). This helps--allows him to do more Doesn't need this for golf (when he rides cart)---only if he is going to be walking for a while Hasn't tried tylenol in some time--the ibuprofen was better  Current Outpatient Prescriptions on File Prior to Visit  Medication Sig Dispense Refill  . aspirin 81 MG tablet Take 81 mg by mouth daily.    . Cholecalciferol (VITAMIN D-3) 1000 UNITS CAPS Take by mouth 2 (two) times daily.    Marland Kitchen latanoprost (XALATAN) 0.005 % ophthalmic solution Place 1 drop into both eyes at bedtime.    . Multiple Vitamins-Minerals (PRESERVISION AREDS 2 PO) Take 2 tablets by mouth daily.     . pravastatin (PRAVACHOL) 20 MG tablet TAKE 1 TABLET DAILY 90 tablet 3   No current facility-administered medications on file prior to visit.     Allergies  Allergen Reactions  . Naphazoline-Polyethyl Glycol Other (See Comments)    (Afgan) redness    Past Medical History:  Diagnosis Date  . Anxiety   . Cataract    left eye  . Chronic venous insufficiency   . Clotting disorder ([place]) 2013   blood clot 3 days post knee surgery  . Diverticulosis of colon   . Glaucoma   . HLD (hyperlipidemia)   . Hx of colonic polyp   . Macular degeneration    legally blind in right eye  . OA (osteoarthritis)   . Personal history of prostate cancer   . Pulmonary embolism ([place]) 11/13   post op TKR  . Spinal stenosis of lumbar region     Past Surgical History:  Procedure Laterality Date  . APPENDECTOMY    . COLONOSCOPY  2011  . INGUINAL HERNIA REPAIR     left  . INGUINAL HERNIA REPAIR  5/12   Dr  Priscille Heidelberg  . INGUINAL HERNIA REPAIR  5/12   Dr Jamal Collin did redo of this  . JOINT REPLACEMENT  11/13   Left total knee--Dr Hooten  . KNEE SURGERY  2013  . POLYPECTOMY  2011  . PROSTATECTOMY  2010  . TONSILLECTOMY    . VARICOSE VEIN SURGERY     left    Family History  Problem Relation Age of Onset  . Stroke Father   . Dementia Mother   . Leukemia Sister   . Colon cancer Neg Hx     Social History   Social History  . Marital status: Married    Spouse name: N/A  . Number of children: 0  . Years of education: N/A   Occupational History  . Retired-purchasing for [employer]    Social History Main Topics  . Smoking status: Former Smoker    Quit date: 12/03/1978  . Smokeless tobacco: Never Used  . Alcohol use 0.0 oz/week     Comment: wine nightly  . Drug use: No  . Sexual activity: Not on file   Other Topics Concern  . Not on file   Social History Narrative   Has living will   DNR done 11/12--- he doesn't have it on display though (since he is  not sure about this)   Wife is health care POA--then niece Evangeline Dakin    No feeding tube if cognitively unaware   Review of Systems  Appetite is fine Weight stable No history of stomach ulcers    Objective:   Physical Exam  Constitutional: No distress.  Musculoskeletal:  No back tenderness SLR negative No right knee swelling No ligament/meniscus weakness  Neurological:  Normal gait--slight hunching No focal weakness          Assessment & Plan:

## 2016-10-29 NOTE — Progress Notes (Signed)
Pre visit review using our clinic review tool, if applicable. No additional management support is needed unless otherwise documented below in the visit note. 

## 2016-10-29 NOTE — Assessment & Plan Note (Signed)
Discussed potential problems with surgery He is functionally pretty good Discussed regular ibuprofen--up to once a day for now Recommended PPI for gastroprotection Will check renal function at next visit

## 2016-11-16 DIAGNOSIS — M5416 Radiculopathy, lumbar region: Secondary | ICD-10-CM | POA: Diagnosis not present

## 2016-11-16 DIAGNOSIS — M5136 Other intervertebral disc degeneration, lumbar region: Secondary | ICD-10-CM | POA: Diagnosis not present

## 2016-11-16 DIAGNOSIS — M48062 Spinal stenosis, lumbar region with neurogenic claudication: Secondary | ICD-10-CM | POA: Diagnosis not present

## 2017-01-02 ENCOUNTER — Inpatient Hospital Stay
Admission: EM | Admit: 2017-01-02 | Discharge: 2017-01-03 | DRG: 309 | Disposition: A | Discharge status: Home / Self Care | Payer: Medicare Other | Attending: Internal Medicine | Admitting: Internal Medicine

## 2017-01-02 ENCOUNTER — Encounter: Payer: Self-pay | Admitting: Emergency Medicine

## 2017-01-02 ENCOUNTER — Emergency Department: Payer: Medicare Other

## 2017-01-02 DIAGNOSIS — Z96652 Presence of left artificial knee joint: Secondary | ICD-10-CM | POA: Diagnosis present

## 2017-01-02 DIAGNOSIS — Z806 Family history of leukemia: Secondary | ICD-10-CM

## 2017-01-02 DIAGNOSIS — I493 Ventricular premature depolarization: Secondary | ICD-10-CM | POA: Diagnosis not present

## 2017-01-02 DIAGNOSIS — M1990 Unspecified osteoarthritis, unspecified site: Secondary | ICD-10-CM | POA: Diagnosis present

## 2017-01-02 DIAGNOSIS — R001 Bradycardia, unspecified: Secondary | ICD-10-CM | POA: Diagnosis present

## 2017-01-02 DIAGNOSIS — H409 Unspecified glaucoma: Secondary | ICD-10-CM | POA: Diagnosis present

## 2017-01-02 DIAGNOSIS — R2681 Unsteadiness on feet: Secondary | ICD-10-CM

## 2017-01-02 DIAGNOSIS — E860 Dehydration: Secondary | ICD-10-CM | POA: Diagnosis present

## 2017-01-02 DIAGNOSIS — H3530 Unspecified macular degeneration: Secondary | ICD-10-CM | POA: Diagnosis present

## 2017-01-02 DIAGNOSIS — K5730 Diverticulosis of large intestine without perforation or abscess without bleeding: Secondary | ICD-10-CM | POA: Diagnosis present

## 2017-01-02 DIAGNOSIS — N179 Acute kidney failure, unspecified: Secondary | ICD-10-CM | POA: Diagnosis present

## 2017-01-02 DIAGNOSIS — R42 Dizziness and giddiness: Secondary | ICD-10-CM | POA: Diagnosis not present

## 2017-01-02 DIAGNOSIS — E785 Hyperlipidemia, unspecified: Secondary | ICD-10-CM | POA: Diagnosis present

## 2017-01-02 DIAGNOSIS — H269 Unspecified cataract: Secondary | ICD-10-CM | POA: Diagnosis present

## 2017-01-02 DIAGNOSIS — N183 Chronic kidney disease, stage 3 (moderate): Secondary | ICD-10-CM | POA: Diagnosis present

## 2017-01-02 DIAGNOSIS — H5461 Unqualified visual loss, right eye, normal vision left eye: Secondary | ICD-10-CM | POA: Diagnosis present

## 2017-01-02 DIAGNOSIS — M48061 Spinal stenosis, lumbar region without neurogenic claudication: Secondary | ICD-10-CM | POA: Diagnosis present

## 2017-01-02 DIAGNOSIS — I872 Venous insufficiency (chronic) (peripheral): Secondary | ICD-10-CM | POA: Diagnosis present

## 2017-01-02 DIAGNOSIS — N19 Unspecified kidney failure: Secondary | ICD-10-CM | POA: Diagnosis not present

## 2017-01-02 DIAGNOSIS — Z86010 Personal history of colonic polyps: Secondary | ICD-10-CM | POA: Diagnosis not present

## 2017-01-02 DIAGNOSIS — I959 Hypotension, unspecified: Secondary | ICD-10-CM | POA: Diagnosis present

## 2017-01-02 DIAGNOSIS — Z820 Family history of epilepsy and other diseases of the nervous system: Secondary | ICD-10-CM | POA: Diagnosis not present

## 2017-01-02 DIAGNOSIS — Z823 Family history of stroke: Secondary | ICD-10-CM | POA: Diagnosis not present

## 2017-01-02 DIAGNOSIS — Z8546 Personal history of malignant neoplasm of prostate: Secondary | ICD-10-CM | POA: Diagnosis not present

## 2017-01-02 DIAGNOSIS — I4510 Unspecified right bundle-branch block: Secondary | ICD-10-CM | POA: Diagnosis present

## 2017-01-02 DIAGNOSIS — Z7982 Long term (current) use of aspirin: Secondary | ICD-10-CM | POA: Diagnosis not present

## 2017-01-02 DIAGNOSIS — Z87891 Personal history of nicotine dependence: Secondary | ICD-10-CM

## 2017-01-02 HISTORY — DX: Malignant (primary) neoplasm, unspecified: C80.1

## 2017-01-02 LAB — URINALYSIS, COMPLETE (UACMP) WITH MICROSCOPIC
Bacteria, UA: NONE SEEN
Bilirubin Urine: NEGATIVE
GLUCOSE, UA: NEGATIVE mg/dL
HGB URINE DIPSTICK: NEGATIVE
Ketones, ur: NEGATIVE mg/dL
Leukocytes, UA: NEGATIVE
NITRITE: NEGATIVE
PH: 6 (ref 5.0–8.0)
PROTEIN: NEGATIVE mg/dL
SPECIFIC GRAVITY, URINE: 1.018 (ref 1.005–1.030)

## 2017-01-02 LAB — BASIC METABOLIC PANEL
Anion gap: 5 (ref 5–15)
BUN: 25 mg/dL — AB (ref 6–20)
CALCIUM: 8.7 mg/dL — AB (ref 8.9–10.3)
CO2: 27 mmol/L (ref 22–32)
CREATININE: 1.4 mg/dL — AB (ref 0.61–1.24)
Chloride: 106 mmol/L (ref 101–111)
GFR calc Af Amer: 53 mL/min — ABNORMAL LOW (ref >60)
GFR, EST NON AFRICAN AMERICAN: 46 mL/min — AB (ref >60)
GLUCOSE: 101 mg/dL — AB (ref 65–99)
Potassium: 4.1 mmol/L (ref 3.5–5.1)
Sodium: 138 mmol/L (ref 135–145)

## 2017-01-02 LAB — CBC
HCT: 37.7 % — ABNORMAL LOW (ref 40.0–52.0)
Hemoglobin: 12.9 g/dL — ABNORMAL LOW (ref 13.0–18.0)
MCH: 29.7 pg (ref 26.0–34.0)
MCHC: 34.1 g/dL (ref 32.0–36.0)
MCV: 87.2 fL (ref 80.0–100.0)
PLATELETS: 174 10*3/uL (ref 150–440)
RBC: 4.32 MIL/uL — ABNORMAL LOW (ref 4.40–5.90)
RDW: 14 % (ref 11.5–14.5)
WBC: 7.8 10*3/uL (ref 3.8–10.6)

## 2017-01-02 LAB — MAGNESIUM: Magnesium: 2.2 mg/dL (ref 1.7–2.4)

## 2017-01-02 LAB — TROPONIN I: Troponin I: <0.03 ng/mL (ref <0.03)

## 2017-01-02 MED ORDER — ONDANSETRON HCL 4 MG/2ML IJ SOLN
4.0000 mg | Freq: Four times a day (QID) | INTRAMUSCULAR | Status: DC | PRN
Start: 2017-01-02 — End: 2017-01-03

## 2017-01-02 MED ORDER — SODIUM CHLORIDE 0.9% FLUSH
3.0000 mL | Freq: Two times a day (BID) | INTRAVENOUS | Status: DC
  Administered 2017-01-02: 3 mL via INTRAVENOUS

## 2017-01-02 MED ORDER — LATANOPROST 0.005 % OP SOLN
1.0000 [drp] | Freq: Every day | OPHTHALMIC | Status: DC
  Filled 2017-01-02: qty 2.5

## 2017-01-02 MED ORDER — MAGNESIUM SULFATE 2 GM/50ML IV SOLN
2.0000 g | Freq: Once | INTRAVENOUS | Status: AC
  Administered 2017-01-02: 2 g via INTRAVENOUS
  Filled 2017-01-02: qty 50

## 2017-01-02 MED ORDER — ACETAMINOPHEN 325 MG PO TABS: 650.0000 mg | ORAL_TABLET | Freq: Four times a day (QID) | ORAL | Status: DC | PRN

## 2017-01-02 MED ORDER — PRAVASTATIN SODIUM 20 MG PO TABS
20.0000 mg | ORAL_TABLET | Freq: Every day | ORAL | Status: DC
  Administered 2017-01-03: 20 mg via ORAL
  Filled 2017-01-02 (×2): qty 1

## 2017-01-02 MED ORDER — ACETAMINOPHEN 650 MG RE SUPP
650.0000 mg | Freq: Four times a day (QID) | RECTAL | Status: DC | PRN
Start: 2017-01-02 — End: 2017-01-03

## 2017-01-02 MED ORDER — SODIUM CHLORIDE 0.9 % IV SOLN
INTRAVENOUS | Status: DC
  Administered 2017-01-02: via INTRAVENOUS

## 2017-01-02 MED ORDER — ASPIRIN EC 81 MG PO TBEC
81.0000 mg | DELAYED_RELEASE_TABLET | Freq: Every day | ORAL | Status: DC
  Administered 2017-01-03: 81 mg via ORAL
  Filled 2017-01-02: qty 1

## 2017-01-02 MED ORDER — MAGNESIUM CITRATE PO SOLN: 1.0000 | Freq: Once | ORAL | Status: DC | PRN

## 2017-01-02 MED ORDER — BISACODYL 5 MG PO TBEC: 5.0000 mg | DELAYED_RELEASE_TABLET | Freq: Every day | ORAL | Status: DC | PRN

## 2017-01-02 MED ORDER — ASPIRIN 81 MG PO CHEW
324.0000 mg | CHEWABLE_TABLET | Freq: Once | ORAL | Status: AC
  Administered 2017-01-02: 324 mg via ORAL
  Filled 2017-01-02: qty 4

## 2017-01-02 MED ORDER — VITAMIN D 1000 UNITS PO TABS
5000.0000 [IU] | ORAL_TABLET | Freq: Every day | ORAL | Status: DC
  Administered 2017-01-03: 5000 [IU] via ORAL
  Filled 2017-01-02: qty 5

## 2017-01-02 MED ORDER — SENNOSIDES-DOCUSATE SODIUM 8.6-50 MG PO TABS: 1.0000 | ORAL_TABLET | Freq: Every evening | ORAL | Status: DC | PRN

## 2017-01-02 MED ORDER — SODIUM CHLORIDE 0.9 % IV BOLUS (SEPSIS)
1000.0000 mL | Freq: Once | INTRAVENOUS | Status: AC
  Administered 2017-01-02: 1000 mL via INTRAVENOUS

## 2017-01-02 MED ORDER — ENOXAPARIN SODIUM 40 MG/0.4ML ~~LOC~~ SOLN
40.0000 mg | SUBCUTANEOUS | Status: DC
  Administered 2017-01-02: 40 mg via SUBCUTANEOUS
  Filled 2017-01-02: qty 0.4

## 2017-01-02 MED ORDER — ONDANSETRON HCL 4 MG PO TABS: 4.0000 mg | ORAL_TABLET | Freq: Four times a day (QID) | ORAL | Status: DC | PRN

## 2017-01-02 NOTE — ED Triage Notes (Signed)
Pt in via POV from [facility]; pt reports standing up to get ready to leave and becoming dizzy to the point where he had to sit back down to keep from falling.  RN at [facility] advised pt to be evaluated to due to hypotension and bradycardia per her assessment; [place] reports BP 102/60, HR 40.  Vitals WDL, NAD noted at this time.

## 2017-01-02 NOTE — ED Provider Notes (Signed)
[facility] Emergency Department Provider Note  ____________________________________________  Time seen: Approximately 7:44 PM  I have reviewed the triage vital signs and the nursing notes.   HISTORY  Chief Complaint Dizziness   HPI Juan Horn is a 83 y.o. male with h/o one provoked DVT/PE during knee surgery in 2013, prostate cancer in remission for 5 years, cataracts who presents for evaluation of dizziness. Patient reports that he was in his usual state of health today. He reports that he states he stood up to leave the house with his wife when he became dizzy. He tells me that he did not feel faint that he felt like he was unable to walk straight. Also denies vertigo. Denies diplopia, dysphagia, facial droop, unilateral weakness or numbness, chest pain, back pain, shortness of breath, headache. The wife reports that the patient felt very pale and diaphoretic. His head down with improvement of his symptoms. A few minutes later he try to get up and felt the same. They called a nurse from the facility due to patient's blood pressure which was 102/60 and heart rate was 40. He was then sent over here for evaluation. Patient reports that he feels back to his baseline at this time. He is a former smoker. No personal or family history of stroke.  Past Medical History:  Diagnosis Date  . Anxiety   . Cancer [facility]) 2010   Prostate Cancer  . Cataract    left eye  . Chronic venous insufficiency   . Clotting disorder ([place]) 2013   blood clot 3 days post knee surgery  . Diverticulosis of colon   . Glaucoma   . HLD (hyperlipidemia)   . Hx of colonic polyp   . Macular degeneration    legally blind in right eye  . OA (osteoarthritis)   . Personal history of prostate cancer   . Pulmonary embolism ([place]) 11/13   post op TKR  . Spinal stenosis of lumbar region     Patient Active Problem List   Diagnosis Date Noted  . Spinal stenosis, lumbar region, with  neurogenic claudication 10/29/2016  . Advance directive discussed with patient 04/12/2015  . Personal history of prostate cancer   . Routine general medical examination at a health care facility 04/02/2013  . Chronic venous insufficiency   . Osteoarthrosis of knee 03/23/2011  . Glaucoma   . Macular degeneration   . Hyperlipemia 10/24/2010  . DIVERTICULOSIS, COLON 10/24/2010  . SPINAL STENOSIS, LUMBAR 10/24/2010  . COLONIC POLYPS, HX OF 10/24/2010    Past Surgical History:  Procedure Laterality Date  . APPENDECTOMY    . COLONOSCOPY  2011  . INGUINAL HERNIA REPAIR     left  . INGUINAL HERNIA REPAIR  5/12   Dr Priscille Heidelberg  . INGUINAL HERNIA REPAIR  5/12   Dr Jamal Collin did redo of this  . JOINT REPLACEMENT  11/13   Left total knee--Dr Hooten  . KNEE SURGERY  2013  . POLYPECTOMY  2011  . PROSTATECTOMY  2010  . TONSILLECTOMY    . VARICOSE VEIN SURGERY     left    Prior to Admission medications   Medication Sig Start Date End Date Taking? Authorizing Provider  aspirin 81 MG tablet Take 81 mg by mouth daily.    Historical Provider, MD  Cholecalciferol (VITAMIN D-3) 1000 UNITS CAPS Take by mouth 2 (two) times daily.    Historical Provider, MD  latanoprost (XALATAN) 0.005 % ophthalmic solution Place 1 drop into both  eyes at bedtime.    Historical Provider, MD  Multiple Vitamins-Minerals (PRESERVISION AREDS 2 PO) Take 2 tablets by mouth daily.     Historical Provider, MD  omeprazole (PRILOSEC) 20 MG capsule Take 1 capsule (20 mg total) by mouth daily. 10/29/16   Venia Carbon, MD  pravastatin (PRAVACHOL) 20 MG tablet TAKE 1 TABLET DAILY 05/31/16   Venia Carbon, MD    Allergies Naphazoline-polyethyl glycol  Family History  Problem Relation Age of Onset  . Stroke Father   . Dementia Mother   . Leukemia Sister   . Colon cancer Neg Hx     Social History Social History  Substance Use Topics  . Smoking status: Former Smoker    Types: Cigarettes    Quit date: 12/03/1978    . Smokeless tobacco: Never Used  . Alcohol use 4.2 oz/week    7 Glasses of wine per week    Review of Systems  Constitutional: Negative for fever. + dizziness Eyes: Negative for visual changes. ENT: Negative for sore throat. Neck: No neck pain  Cardiovascular: Negative for chest pain. Respiratory: Negative for shortness of breath. Gastrointestinal: Negative for abdominal pain, vomiting or diarrhea. Genitourinary: Negative for dysuria. Musculoskeletal: Negative for back pain. Skin: Negative for rash. Neurological: Negative for headaches, weakness or numbness. Psych: No SI or HI  ____________________________________________   PHYSICAL EXAM:  VITAL SIGNS: ED Triage Vitals  Enc Vitals Group     BP 01/02/17 1447 140/66     Pulse Rate 01/02/17 1447 69     Resp 01/02/17 1447 18     Temp 01/02/17 1447 97.8 F (36.6 C)     Temp Source 01/02/17 1447 Oral     SpO2 01/02/17 1447 96 %     Weight 01/02/17 1448 185 lb (83.9 kg)     Height 01/02/17 1448 5\' 8"  (1.727 m)     Head Circumference --      Peak Flow --      Pain Score 01/02/17 1901 0     Pain Loc --      Pain Edu? --      Excl. in [place]? --     Constitutional: Alert and oriented. Well appearing and in no apparent distress. HEENT:      Head: Normocephalic and atraumatic.         Eyes: Conjunctivae are normal. Sclera is non-icteric. EOMI. PERRL      Mouth/Throat: Mucous membranes are moist.       Neck: Supple with no signs of meningismus. Cardiovascular: Regular rate and rhythm. No murmurs, gallops, or rubs. 2+ symmetrical distal pulses are present in all extremities. No JVD. Respiratory: Normal respiratory effort. Lungs are clear to auscultation bilaterally. No wheezes, crackles, or rhonchi.  Gastrointestinal: Soft, non tender, and non distended with positive bowel sounds. No rebound or guarding. Musculoskeletal: Nontender with normal range of motion in all extremities. No edema, cyanosis, or erythema of  extremities. Neurologic: Normal speech and language. A & O x3, PERRL, no nystagmus, CN II-XII intact, motor testing reveals good tone and bulk throughout. There is no evidence of pronator drift or dysmetria. Muscle strength is 5/5 throughout. Deep tendon reflexes are 2+ throughout with downgoing toes. Sensory examination is intact. Gait is normal. Skin: Skin is warm, dry and intact. No rash noted. Psychiatric: Mood and affect are normal. Speech and behavior are normal.  ____________________________________________   LABS (all labs ordered are listed, but only abnormal results are displayed)  Labs Reviewed  BASIC METABOLIC  PANEL - Abnormal; Notable for the following:       Result Value   Glucose, Bld 101 (*)    BUN 25 (*)    Creatinine, Ser 1.40 (*)    Calcium 8.7 (*)    GFR calc non Af Amer 46 (*)    GFR calc Af Amer 53 (*)    All other components within normal limits  CBC - Abnormal; Notable for the following:    RBC 4.32 (*)    Hemoglobin 12.9 (*)    HCT 37.7 (*)    All other components within normal limits  URINALYSIS, COMPLETE (UACMP) WITH MICROSCOPIC - Abnormal; Notable for the following:    Color, Urine YELLOW (*)    APPearance CLEAR (*)    Squamous Epithelial / LPF 0-5 (*)    All other components within normal limits  TROPONIN I  MAGNESIUM  CBG MONITORING, ED   ____________________________________________  EKG  ED ECG REPORT I, Rudene Re, the attending physician, personally viewed and interpreted this ECG.  Normal sinus rhythm, rate of 65, normal PR and QTc intervals, right bundle branch block, normal axis, nonspecific ST-T wave abnormalities on inferior and lateral leads. Unchanged from prior   20:30 - Ventricular bigeminy, rate of 111, right bundle branch block, normal QRS, normal axis, no ST elevations or depressions. ____________________________________________  RADIOLOGY  HEAD CT:   negative ____________________________________________   PROCEDURES  Procedure(s) performed: None Procedures Critical Care performed:  None ____________________________________________   INITIAL IMPRESSION / ASSESSMENT AND PLAN / ED COURSE   83 y.o. male with h/o one provoked DVT/PE during knee surgery in 2013, prostate cancer in remission for 5 years, cataracts who presents for evaluation of 2 episodes of dizziness while patient stood up. No episodes while sitting down. He is neurologically intact. Noted to be bradycardic and hypotensive. EKG with no ischemic changes. Blood work with no acute findings including CBC, CMP, UA. We'll check for flu, we'll check a troponin, we'll get a head CT although low suspicion for stroke this patient is neurologically intact. We'll get orthostatic vital signs. We'll give IV fluids.  Clinical Course as of Jan 02 2031  Wed Jan 02, 2017  2029 Patient noted to have a change in his telemetry. Repeat EKG showed ventricular bigeminy. Troponin is pending. Patient has no chest pain. His pulse is very between 30 and 110. Blood pressure is now in the low 100s. We'll give IV magnesium. We'll admit to the hospitalist service.  [CV]    Clinical Course User Index [CV] Rudene Re, MD    Pertinent labs & imaging results that were available during my care of the patient were reviewed by me and considered in my medical decision making (see chart for details).    ____________________________________________   FINAL CLINICAL IMPRESSION(S) / ED DIAGNOSES  Final diagnoses:  Symptomatic bradycardia      NEW MEDICATIONS STARTED DURING THIS VISIT:  New Prescriptions   No medications on file     Note:  This document was prepared using Dragon voice recognition software and may include unintentional dictation errors.    Rudene Re, MD 01/02/17 2032

## 2017-01-02 NOTE — H&P (Addendum)
History and Physical   [facility] - [place] @ [facility] Admission History and Physical [employer], D.O.    Patient Name: Juan Horn MR#: [id number] Date of Birth: 01/11/1933 Date of Admission: 01/02/2017  Referring MD/NP/PA: Dr. Alfred Levins Primary Care Physician: Viviana Simpler, MD Outpatient Specialists:   Patient coming from: Assisted Living  Chief Complaint: Dizziness  HPI: Juan Horn is a 83 y.o. male with a known history of DVT following TKR, prostate cancer, anxiety, venous insufficiency,  OA, spinal stenosis was in a usual state of health until this afternoon when he describes sudden onset of dizziness when he went from sitting to standing associated with gait disturbance and imbalance. Patient stated the symptoms resolved spontaneously however his wife was concerned because he week and pale. A nurse from the assisted living checked his vitals and found him to have a heart rate in the 40s with a blood pressure of 102/60. She says only referred him to EMS for evaluation the emergency department..   Otherwise there has been no change in status. Patient has been taking medication as prescribed and there has been no recent change in medication or diet.  No recent antibiotics.  There has been no recent illness, hospitalizations, travel or sick contacts.    Patient denies fevers/chills, weakness, dizziness, chest pain, shortness of breath, N/V/C/D, abdominal pain, dysuria/frequency, changes in mental status.   ED Course: In the emergency department patient was found to have ventricular bigeminy with a pulse rate in the 20s associated with periods of dizziness, lightheadedness. He was given IV magnesium and hospitalists were contacted for admission.  Review of Systems:  CONSTITUTIONAL: No fever/chills, fatigue, weight gain/loss, headache. Positive dizziness, weakness EYES: No blurry or double vision. ENT: No tinnitus, postnasal drip, redness or soreness of the  oropharynx. RESPIRATORY: No cough, dyspnea, wheeze.  No hemoptysis.  CARDIOVASCULAR: No chest pain, palpitations, syncope, orthopnea. No lower extremity edema.  GASTROINTESTINAL: No nausea, vomiting, abdominal pain, diarrhea, constipation.  No hematemesis, melena or hematochezia. GENITOURINARY: No dysuria, frequency, hematuria. ENDOCRINE: No polyuria or nocturia. No heat or cold intolerance. HEMATOLOGY: No anemia, bruising, bleeding. INTEGUMENTARY: No rashes, ulcers, lesions. MUSCULOSKELETAL: No arthritis, gout, dyspnea. NEUROLOGIC: No numbness, tingling, ataxia, seizure-type activity, weakness. PSYCHIATRIC: No anxiety, depression, insomnia.   Past Medical History:  Diagnosis Date  . Anxiety   . Cancer [employer]) 2010   Prostate Cancer  . Cataract    left eye  . Chronic venous insufficiency   . Clotting disorder ([place]) 2013   blood clot 3 days post knee surgery  . Diverticulosis of colon   . Glaucoma   . HLD (hyperlipidemia)   . Hx of colonic polyp   . Macular degeneration    legally blind in right eye  . OA (osteoarthritis)   . Personal history of prostate cancer   . Pulmonary embolism ([place]) 11/13   post op TKR  . Spinal stenosis of lumbar region     Past Surgical History:  Procedure Laterality Date  . APPENDECTOMY    . COLONOSCOPY  2011  . INGUINAL HERNIA REPAIR     left  . INGUINAL HERNIA REPAIR  5/12   Dr Priscille Heidelberg  . INGUINAL HERNIA REPAIR  5/12   Dr Jamal Collin did redo of this  . JOINT REPLACEMENT  11/13   Left total knee--Dr Hooten  . KNEE SURGERY  2013  . POLYPECTOMY  2011  . PROSTATECTOMY  2010  . TONSILLECTOMY    . VARICOSE VEIN SURGERY  left     reports that he quit smoking about 38 years ago. His smoking use included Cigarettes. He has never used smokeless tobacco. He reports that he drinks about 4.2 oz of alcohol per week . He reports that he does not use drugs.  Allergies  Allergen Reactions  . Naphazoline-Polyethyl Glycol Other (See Comments)     (Afgan) redness    Family History  Problem Relation Age of Onset  . Stroke Father   . Dementia Mother   . Leukemia Sister   . Colon cancer Neg Hx    Family history has been reviewed and confirmed with patient.   Prior to Admission medications   Medication Sig Start Date End Date Taking? Authorizing Provider  aspirin 81 MG tablet Take 81 mg by mouth daily.   Yes Historical Provider, MD  Cholecalciferol (VITAMIN D-3) 5000 units TABS Take 5,000 Units by mouth daily.    Yes Historical Provider, MD  latanoprost (XALATAN) 0.005 % ophthalmic solution Place 1 drop into both eyes at bedtime.   Yes Historical Provider, MD  Multiple Vitamins-Minerals (PRESERVISION AREDS 2 PO) Take 2 tablets by mouth daily.    Yes Historical Provider, MD  pravastatin (PRAVACHOL) 20 MG tablet TAKE 1 TABLET DAILY 05/31/16  Yes Venia Carbon, MD  omeprazole (PRILOSEC) 20 MG capsule Take 1 capsule (20 mg total) by mouth daily. Patient not taking: Reported on 01/02/2017 10/29/16   Venia Carbon, MD    Physical Exam: Vitals:   01/02/17 1900 01/02/17 1930 01/02/17 2000 01/02/17 2030  BP: 123/73 (!) 142/79 96/74 140/71  Pulse: 63 65 71 68  Resp: (!) 22 20 17 17   Temp:      TempSrc:      SpO2: 96% 97% 93% 99%  Weight:      Height:        GENERAL: 83 y.o.-year-old Male patient, well-developed, well-nourished lying in the bed in no acute distress.  Pleasant and cooperative.   HEENT: Head atraumatic, normocephalic. Pupils equal, round, reactive to light and accommodation. No scleral icterus. Extraocular muscles intact. Nares are patent. Oropharynx is clear. Mucus membranes moist. NECK: Supple, full range of motion. No JVD, no bruit heard. No thyroid enlargement, no tenderness, no cervical lymphadenopathy. CHEST: Normal breath sounds bilaterally. No wheezing, rales, rhonchi or crackles. No use of accessory muscles of respiration.  No reproducible chest wall tenderness.  CARDIOVASCULAR: S1, S2 normal. No murmurs,  rubs, or gallops. Cap refill <2 seconds. Pulses intact distally.  ABDOMEN: Soft, nondistended, nontender. No rebound, guarding, rigidity. Normoactive bowel sounds present in all four quadrants. No organomegaly or mass. EXTREMITIES: No pedal edema, cyanosis, or clubbing. No calf tenderness or Homan's sign.  NEUROLOGIC: The patient is alert and oriented x 3. Cranial nerves II through XII are grossly intact with no focal sensorimotor deficit. Muscle strength 5/5 in all extremities. Sensation intact. Gait not checked. PSYCHIATRIC:  Normal affect, mood, thought content. SKIN: Warm, dry, and intact without obvious rash, lesion, or ulcer.    Labs on Admission:  CBC:  Recent Labs Lab 01/02/17 1453  WBC 7.8  HGB 12.9*  HCT 37.7*  MCV 87.2  PLT [id number]   Basic Metabolic Panel:  Recent Labs Lab 01/02/17 1453  NA 138  K 4.1  CL 106  CO2 27  GLUCOSE 101*  BUN 25*  CREATININE 1.40*  CALCIUM 8.7*  MG 2.2   GFR: Estimated Creatinine Clearance: 43.7 mL/min (by C-G formula based on SCr of 1.4 mg/dL (H)). Liver Function Tests:  No results for input(s): AST, ALT, ALKPHOS, BILITOT, PROT, ALBUMIN in the last 168 hours. No results for input(s): LIPASE, AMYLASE in the last 168 hours. No results for input(s): AMMONIA in the last 168 hours. Coagulation Profile: No results for input(s): INR, PROTIME in the last 168 hours. Cardiac Enzymes:  Recent Labs Lab 01/02/17 1453  TROPONINI <0.03   BNP (last 3 results) No results for input(s): PROBNP in the last 8760 hours. HbA1C: No results for input(s): HGBA1C in the last 72 hours. CBG: No results for input(s): GLUCAP in the last 168 hours. Lipid Profile: No results for input(s): CHOL, HDL, LDLCALC, TRIG, CHOLHDL, LDLDIRECT in the last 72 hours. Thyroid Function Tests: No results for input(s): TSH, T4TOTAL, FREET4, T3FREE, THYROIDAB in the last 72 hours. Anemia Panel: No results for input(s): VITAMINB12, FOLATE, FERRITIN, TIBC, IRON, RETICCTPCT  in the last 72 hours. Urine analysis:    Component Value Date/Time   COLORURINE YELLOW (A) 01/02/2017 1453   APPEARANCEUR CLEAR (A) 01/02/2017 1453   APPEARANCEUR Hazy 09/15/2012 0802   LABSPEC 1.018 01/02/2017 1453   LABSPEC 1.017 09/15/2012 0802   PHURINE 6.0 01/02/2017 1453   GLUCOSEU NEGATIVE 01/02/2017 1453   GLUCOSEU 50 mg/dL 09/15/2012 0802   HGBUR NEGATIVE 01/02/2017 1453   BILIRUBINUR NEGATIVE 01/02/2017 1453   BILIRUBINUR Negative 09/15/2012 0802   KETONESUR NEGATIVE 01/02/2017 1453   PROTEINUR NEGATIVE 01/02/2017 1453   NITRITE NEGATIVE 01/02/2017 1453   LEUKOCYTESUR NEGATIVE 01/02/2017 1453   LEUKOCYTESUR Negative 09/15/2012 0802   Sepsis Labs: @LABRCNTIP (procalcitonin:4,lacticidven:4) )No results found for this or any previous visit (from the past 240 hour(s)).   Radiological Exams on Admission: Ct Head Wo Contrast  Result Date: 01/02/2017 CLINICAL DATA:  83 year old male with dizziness. EXAM: CT HEAD WITHOUT CONTRAST TECHNIQUE: Contiguous axial images were obtained from the base of the skull through the vertex without intravenous contrast. COMPARISON:  None. FINDINGS: Brain: No evidence of acute infarction, hemorrhage, hydrocephalus, extra-axial collection or mass lesion/mass effect. Atrophy and probable chronic small-vessel white matter ischemic changes noted. Vascular: Intracranial atherosclerotic calcifications noted. Skull: Normal. Negative for fracture or focal lesion. Sinuses/Orbits: No acute finding. Other: None. IMPRESSION: No evidence of acute intracranial abnormality. Atrophy and probable chronic small-vessel white matter ischemic changes. Electronically Signed   By: Margarette Canada M.D.   On: 01/02/2017 19:55    EKG: Ventricular bigeminy at 111bpm with normal axis, RBBB and nonspecific ST-T wave changes.   Assessment/Plan Active Problems:   Symptomatic bradycardia    This is a 83 y.o. male with a history of DVT following TKR, prostate cancer, anxiety,  venous insufficiency,  OA, spinal stenosis  now being admitted with:  1. Symptomatic bradycardia, unclear cause. Not on any rate controlling meds - Admit inpatient telemetry - Pacer pads at bedside, move to stepdown if patient needs pacing - Trend trops, check TSH, lipids - Check echp - NPO after midnight - Cardio consultation has been requested  2. Hypotension, dizziness 2/2 #1 - Gentle IVF hydration - Check orthostatics  3. AKI on CKD, mild.  - Gentle IVFs and recheck BMP in AM - Bladder scan and place foley catheter if evidence of urinary retention  4. H/o hyperlipidemia - Continue Pravastatin, aspirin  5. H/o PE - Aspirin, Lovenox, SCDs for DVT Px  Admission status: Inpatient, tele IV Fluids: IVNS Diet/Nutrition: Heart healthy Consults called: Cardio, PT  DVT Px: Lovenox, SCDs and early ambulation. Code Status: Full Code  Disposition Plan: To home in 1-2 days   All the records  are reviewed and case discussed with ED provider. Management plans discussed with the patient and/or family who express understanding and agree with plan of care.  Azana Kiesler D.O. on 01/02/2017 at 9:30 PM Between 7am to 6pm - Pager - [phone] After 6pm go to [email or web address] - [employer] [facility] [place] [facility] Office [phone] CC: Primary care physician; Viviana Simpler, MD   01/02/2017, 9:30 PM

## 2017-01-02 NOTE — ED Notes (Signed)
Pt seen by Dr.Veronese, EKG done

## 2017-01-03 ENCOUNTER — Inpatient Hospital Stay: Admit: 2017-01-03 | Payer: Medicare Other

## 2017-01-03 LAB — CBC
HEMATOCRIT: 36.4 % — AB (ref 40.0–52.0)
HEMOGLOBIN: 12.4 g/dL — AB (ref 13.0–18.0)
MCH: 29.4 pg (ref 26.0–34.0)
MCHC: 34 g/dL (ref 32.0–36.0)
MCV: 86.6 fL (ref 80.0–100.0)
Platelets: 154 10*3/uL (ref 150–440)
RBC: 4.2 MIL/uL — ABNORMAL LOW (ref 4.40–5.90)
RDW: 13.9 % (ref 11.5–14.5)
WBC: 6.6 10*3/uL (ref 3.8–10.6)

## 2017-01-03 LAB — BASIC METABOLIC PANEL
ANION GAP: 5 (ref 5–15)
BUN: 22 mg/dL — ABNORMAL HIGH (ref 6–20)
CHLORIDE: 109 mmol/L (ref 101–111)
CO2: 26 mmol/L (ref 22–32)
Calcium: 8.2 mg/dL — ABNORMAL LOW (ref 8.9–10.3)
Creatinine, Ser: 1.23 mg/dL (ref 0.61–1.24)
GFR calc Af Amer: >60 mL/min (ref >60)
GFR, EST NON AFRICAN AMERICAN: 53 mL/min — AB (ref >60)
GLUCOSE: 89 mg/dL (ref 65–99)
POTASSIUM: 3.9 mmol/L (ref 3.5–5.1)
Sodium: 140 mmol/L (ref 135–145)

## 2017-01-03 LAB — MRSA PCR SCREENING: MRSA BY PCR: NEGATIVE

## 2017-01-03 LAB — PHOSPHORUS: Phosphorus: 2.5 mg/dL (ref 2.5–4.6)

## 2017-01-03 LAB — TROPONIN I: Troponin I: <0.03 ng/mL (ref <0.03)

## 2017-01-03 LAB — MAGNESIUM: Magnesium: 2.7 mg/dL — ABNORMAL HIGH (ref 1.7–2.4)

## 2017-01-03 LAB — GLUCOSE, CAPILLARY: Glucose-Capillary: 94 mg/dL (ref 65–99)

## 2017-01-03 NOTE — Consult Note (Signed)
[facility] Cardiology  CARDIOLOGY CONSULT NOTE  Patient ID: Juan Horn MRN: [id number] DOB/AGE: 1933/04/08 83 y.o.  Admit date: 01/02/2017 Referring Physician Posey Pronto Primary Physician [facility] Primary Cardiologist None on file Reason for Consultation Symptomatic bradycardia  HPI: 83 year old male resident of [place] referred for symptomatic bradycardia. Patient has a history of DVT/PE peri knee replacement in 2013. Patient presented to [facility] ER on 01/02/2017 for dizziness and bradycardia. Patient states he was at home when he developed dizziness upon standing from a seating position. The nurse checked his pulse and found it to be in the 40s with a blood pressure of 102/60. The dizziness resolved within 5 minutes without recurrence. Admission labs notable for negative troponin. ECG revealed normal sinus rhythm at a rate of 65 bpm with normal PR and Qtc intervals, RBBB, and nonspecific ST-T wave abnormalities, which is was unchanged from prior. Repeat ECG revealed ventricular bigeminy at a rate of 111 bpm without ST elevations or depressions. Head CT was negative. He denies experiencing syncope, chest pain, palpitations, shortness of breath, or lower extremity swelling. He states he is no longer experiencing dizziness and feels well.   Review of systems complete and found to be negative unless listed above     Past Medical History:  Diagnosis Date  . Anxiety   . Cancer [facility]) 2010   Prostate Cancer  . Cataract    left eye  . Chronic venous insufficiency   . Clotting disorder ([place]) 2013   blood clot 3 days post knee surgery  . Diverticulosis of colon   . Glaucoma   . HLD (hyperlipidemia)   . Hx of colonic polyp   . Macular degeneration    legally blind in right eye  . OA (osteoarthritis)   . Personal history of prostate cancer   . Pulmonary embolism ([place]) 11/13   post op TKR  . Spinal stenosis of lumbar region     Past Surgical History:  Procedure Laterality Date  .  APPENDECTOMY    . COLONOSCOPY  2011  . INGUINAL HERNIA REPAIR     left  . INGUINAL HERNIA REPAIR  5/12   Dr Priscille Heidelberg  . INGUINAL HERNIA REPAIR  5/12   Dr Jamal Collin did redo of this  . JOINT REPLACEMENT  11/13   Left total knee--Dr Hooten  . KNEE SURGERY  2013  . POLYPECTOMY  2011  . PROSTATECTOMY  2010  . TONSILLECTOMY    . VARICOSE VEIN SURGERY     left    Prescriptions Prior to Admission  Medication Sig Dispense Refill Last Dose  . aspirin 81 MG tablet Take 81 mg by mouth daily.   01/01/2017 at 0800  . Cholecalciferol (VITAMIN D-3) 5000 units TABS Take 5,000 Units by mouth daily.    01/01/2017 at 0800  . latanoprost (XALATAN) 0.005 % ophthalmic solution Place 1 drop into both eyes at bedtime.   01/01/2017 at 2000  . Multiple Vitamins-Minerals (PRESERVISION AREDS 2 PO) Take 2 tablets by mouth daily.    01/01/2017 at 2000  . pravastatin (PRAVACHOL) 20 MG tablet TAKE 1 TABLET DAILY 90 tablet 3 01/01/2017 at 2000  . omeprazole (PRILOSEC) 20 MG capsule Take 1 capsule (20 mg total) by mouth daily. (Patient not taking: Reported on 01/02/2017) 90 capsule 3 Not Taking at Unknown time   Social History   Social History  . Marital status: Married    Spouse name: N/A  . Number of children: 0  . Years of education: N/A   Occupational  History  . Retired-purchasing for [employer]    Social History Main Topics  . Smoking status: Former Smoker    Types: Cigarettes    Quit date: 12/03/1978  . Smokeless tobacco: Never Used  . Alcohol use 4.2 oz/week    7 Glasses of wine per week  . Drug use: No  . Sexual activity: Not on file   Other Topics Concern  . Not on file   Social History Narrative   Has living will   DNR done 11/12--- he doesn't have it on display though (since he is not sure about this)   Wife is health care POA--then niece Evangeline Dakin    No feeding tube if cognitively unaware    Family History  Problem Relation Age of Onset  . Stroke Father   . Dementia Mother   .  Leukemia Sister   . Colon cancer Neg Hx       Review of systems complete and found to be negative unless listed above      PHYSICAL EXAM  General: Well developed, well nourished, in no acute distress HEENT:  Normocephalic and atramatic Neck:  No JVD.  Lungs: Clear bilaterally to auscultation and percussion. Heart: HRRR . Normal S1 and S2 without gallops or murmurs.  Abdomen: Bowel sounds are positive, abdomen soft and non-tender  Msk:  Back normal, normal gait. Normal strength and tone for age. Extremities: No clubbing, cyanosis or edema.   Neuro: Alert and oriented X 3. Psych:  Good affect, responds appropriately  Labs:   Lab Results  Component Value Date   WBC 6.6 01/03/2017   HGB 12.4 (L) 01/03/2017   HCT 36.4 (L) 01/03/2017   MCV 86.6 01/03/2017   PLT 154 01/03/2017    Recent Labs Lab 01/03/17 0548  NA 140  K 3.9  CL 109  CO2 26  BUN 22*  CREATININE 1.23  CALCIUM 8.2*  GLUCOSE 89   Lab Results  Component Value Date   CKTOTAL 435 (H) 10/10/2012   CKMB 5.6 (H) 10/10/2012   TROPONINI <0.03 01/03/2017    Lab Results  Component Value Date   CHOL 167 04/13/2016   CHOL 170 04/12/2015   CHOL 175 04/06/2014   Lab Results  Component Value Date   HDL 34.20 (L) 04/13/2016   HDL 43.40 04/12/2015   HDL 42.90 04/06/2014   Lab Results  Component Value Date   LDLCALC 73 04/06/2014   LDLCALC 78 10/11/2012   Lab Results  Component Value Date   TRIG 244.0 (H) 04/13/2016   TRIG 271.0 (H) 04/12/2015   TRIG 295.0 (H) 04/06/2014   Lab Results  Component Value Date   CHOLHDL 5 04/13/2016   CHOLHDL 4 04/12/2015   CHOLHDL 4 04/06/2014   Lab Results  Component Value Date   LDLDIRECT 103.0 04/13/2016   LDLDIRECT 106.0 04/12/2015   LDLDIRECT 116.8 11/17/2012      Radiology: Ct Head Wo Contrast  Result Date: 01/02/2017 CLINICAL DATA:  82 year old male with dizziness. EXAM: CT HEAD WITHOUT CONTRAST TECHNIQUE: Contiguous axial images were obtained from the  base of the skull through the vertex without intravenous contrast. COMPARISON:  None. FINDINGS: Brain: No evidence of acute infarction, hemorrhage, hydrocephalus, extra-axial collection or mass lesion/mass effect. Atrophy and probable chronic small-vessel white matter ischemic changes noted. Vascular: Intracranial atherosclerotic calcifications noted. Skull: Normal. Negative for fracture or focal lesion. Sinuses/Orbits: No acute finding. Other: None. IMPRESSION: No evidence of acute intracranial abnormality. Atrophy and probable chronic small-vessel white matter ischemic  changes. Electronically Signed   By: Margarette Canada M.D.   On: 01/02/2017 19:55    EKG: Sinus rhythm with frequent PVCs, rate 77 bpm  ASSESSMENT AND PLAN:  1. Perceived sinus bradycardia in the setting of ventricular bigeminy. Patient doing well without chest pain or dizziness.  2. Hypotension resolved with IV fluids  Recommendations 1. Agree with current therapy. 2. Review echocardiogram 3. Further diagnostics to be done as outpatient   Signed: Clabe Seal, PA-C 01/03/2017, 11:09 AM

## 2017-01-03 NOTE — Discharge Summary (Signed)
[facility] NAME: Juan Horn    MR#:  [id number]  DATE OF BIRTH:  [birth year]  DATE OF ADMISSION:  01/02/2017 ADMITTING PHYSICIAN: Harvie Bridge, DO  DATE OF DISCHARGE: 01/03/17  PRIMARY CARE PHYSICIAN: Viviana Simpler, MD    ADMISSION DIAGNOSIS:  Symptomatic bradycardia [R00.1]  DISCHARGE DIAGNOSIS:  Acute renal failure resolved Dizziness resolved PVC's on EKG  SECONDARY DIAGNOSIS:   Past Medical History:  Diagnosis Date  . Anxiety   . Cancer [facility]) 2010   Prostate Cancer  . Cataract    left eye  . Chronic venous insufficiency   . Clotting disorder ([place]) 2013   blood clot 3 days post knee surgery  . Diverticulosis of colon   . Glaucoma   . HLD (hyperlipidemia)   . Hx of colonic polyp   . Macular degeneration    legally blind in right eye  . OA (osteoarthritis)   . Personal history of prostate cancer   . Pulmonary embolism ([place]) 11/13   post op TKR  . Spinal stenosis of lumbar region     HOSPITAL COURSE:   83 y.o. male with a history of DVT following TKR, prostate cancer, anxiety, venous insufficiency, OA, spinal stenosis  now being admitted with:  1. Acute renal fialure due to dehydration-resolved -pt presented with dizzy spell-now improved -creat 1.40---1.0 -in SR with pVC's -HR in the 70's -echo to be done -d/w dr Josefa Half. No further w/u needed since this is PVC's -electrolytes ok -ambulated with PT-no PT needs  2. Hypotension, dizziness 2/2 #1 - Received Gentle IVF hydration - resovled  3. H/o hyperlipidemia - Continue Pravastatin, aspirin  4. H/o PE - Aspirin, Lovenox, SCDs for DVT Px  -overall stable for d/c after echo is completed Out pt f/u with Dr Silvio Pate D/w pt and wife--voiced understanding CONSULTS OBTAINED:  Treatment Team:  Isaias Cowman, MD  DRUG ALLERGIES:   Allergies  Allergen Reactions  . Naphazoline-Polyethyl Glycol Other (See Comments)   (Afgan) redness    DISCHARGE MEDICATIONS:   Current Discharge Medication List    CONTINUE these medications which have NOT CHANGED   Details  aspirin 81 MG tablet Take 81 mg by mouth daily.    Cholecalciferol (VITAMIN D-3) 5000 units TABS Take 5,000 Units by mouth daily.     latanoprost (XALATAN) 0.005 % ophthalmic solution Place 1 drop into both eyes at bedtime.    Multiple Vitamins-Minerals (PRESERVISION AREDS 2 PO) Take 2 tablets by mouth daily.     pravastatin (PRAVACHOL) 20 MG tablet TAKE 1 TABLET DAILY Qty: 90 tablet, Refills: 3    omeprazole (PRILOSEC) 20 MG capsule Take 1 capsule (20 mg total) by mouth daily. Qty: 90 capsule, Refills: 3        If you experience worsening of your admission symptoms, develop shortness of breath, life threatening emergency, suicidal or homicidal thoughts you must seek medical attention immediately by calling 911 or calling your MD immediately  if symptoms less severe.  You Must read complete instructions/literature along with all the possible adverse reactions/side effects for all the Medicines you take and that have been prescribed to you. Take any new Medicines after you have completely understood and accept all the possible adverse reactions/side effects.   Please note  You were cared for by a hospitalist during your hospital stay. If you have any questions about your discharge medications or the care you received while you were in the hospital after you are discharged,  you can call the unit and asked to speak with the hospitalist on call if the hospitalist that took care of you is not available. Once you are discharged, your primary care physician will handle any further medical issues. Please note that NO REFILLS for any discharge medications will be authorized once you are discharged, as it is imperative that you return to your primary care physician (or establish a relationship with a primary care physician if you do not have one) for your  aftercare needs so that they can reassess your need for medications and monitor your lab values. Today   SUBJECTIVE   Doing well. No dizziness. Feels back to baseline  VITAL SIGNS:  Blood pressure 125/65, pulse (!) 52, temperature 97.7 F (36.5 C), temperature source Oral, resp. rate 16, height 5\' 8"  (1.727 m), weight 83.9 kg (185 lb), SpO2 99 %.  I/O:   Intake/Output Summary (Last 24 hours) at 01/03/17 1030 Last data filed at 01/03/17 0725  Gross per 24 hour  Intake              588 ml  Output                0 ml  Net              588 ml    PHYSICAL EXAMINATION:  GENERAL:  83 y.o.-year-old patient lying in the bed with no acute distress.  EYES: Pupils equal, round, reactive to light and accommodation. No scleral icterus. Extraocular muscles intact.  HEENT: Head atraumatic, normocephalic. Oropharynx and nasopharynx clear.  NECK:  Supple, no jugular venous distention. No thyroid enlargement, no tenderness.  LUNGS: Normal breath sounds bilaterally, no wheezing, rales,rhonchi or crepitation. No use of accessory muscles of respiration.  CARDIOVASCULAR: S1, S2 normal. No murmurs, rubs, or gallops.  ABDOMEN: Soft, non-tender, non-distended. Bowel sounds present. No organomegaly or mass.  EXTREMITIES: No pedal edema, cyanosis, or clubbing.  NEUROLOGIC: Cranial nerves II through XII are intact. Muscle strength 5/5 in all extremities. Sensation intact. Gait not checked.  PSYCHIATRIC: The patient is alert and oriented x 3.  SKIN: No obvious rash, lesion, or ulcer.   DATA REVIEW:   CBC   Recent Labs Lab 01/03/17 0548  WBC 6.6  HGB 12.4*  HCT 36.4*  PLT 154    Chemistries   Recent Labs Lab 01/02/17 2321 01/03/17 0548  NA  --  140  K  --  3.9  CL  --  109  CO2  --  26  GLUCOSE  --  89  BUN  --  22*  CREATININE  --  1.23  CALCIUM  --  8.2*  MG 2.7*  --     Microbiology Results   Recent Results (from the past 240 hour(s))  MRSA PCR Screening     Status: None    Collection Time: 01/02/17 11:24 PM  Result Value Ref Range Status   MRSA by PCR NEGATIVE NEGATIVE Final    Comment:        The GeneXpert MRSA Assay (FDA approved for NASAL specimens only), is one component of a comprehensive MRSA colonization surveillance program. It is not intended to diagnose MRSA infection nor to guide or monitor treatment for MRSA infections.     RADIOLOGY:  Ct Head Wo Contrast  Result Date: 01/02/2017 CLINICAL DATA:  83 year old male with dizziness. EXAM: CT HEAD WITHOUT CONTRAST TECHNIQUE: Contiguous axial images were obtained from the base of the skull through the vertex without intravenous contrast. COMPARISON:  None. FINDINGS: Brain: No evidence of acute infarction, hemorrhage, hydrocephalus, extra-axial collection or mass lesion/mass effect. Atrophy and probable chronic small-vessel white matter ischemic changes noted. Vascular: Intracranial atherosclerotic calcifications noted. Skull: Normal. Negative for fracture or focal lesion. Sinuses/Orbits: No acute finding. Other: None. IMPRESSION: No evidence of acute intracranial abnormality. Atrophy and probable chronic small-vessel white matter ischemic changes. Electronically Signed   By: Margarette Canada M.D.   On: 01/02/2017 19:55     Management plans discussed with the patient, family and they are in agreement.  CODE STATUS:     Code Status Orders        Start     Ordered   01/02/17 2305  Full code  Continuous     01/02/17 2304    Code Status History    Date Active Date Inactive Code Status Order ID Comments User Context   This patient has a current code status but no historical code status.    Advance Directive Documentation   Flowsheet Row Most Recent Value  Type of Advance Directive  Healthcare Power of Attorney  Pre-existing out of facility DNR order (yellow form or pink MOST form)  No data  "MOST" Form in Place?  No data      TOTAL TIME TAKING CARE OF THIS PATIENT: 40 minutes.    Ovie Eastep  M.D on 01/03/2017 at 10:30 AM  Between 7am to 6pm - Pager - [phone] After 6pm go to [email or web address] - password [email or web address]  Office  [phone]  CC: Primary care physician; Viviana Simpler, MD

## 2017-01-03 NOTE — Evaluation (Signed)
Physical Therapy Evaluation and Discharge   Patient Details Name: Juan Horn MRN: [id number] DOB: 1933/09/05 Today's Date: 01/03/2017   History of Present Illness  Pt is a 83 y/o M who presented after sudden onset of dizzines when transitioning from sitting to standing.  He was found to be bradycardic with a BP of 102/60.  Pt's PMH includes anxiety, prostate cancer, PE, L TKA, macular degeneration, lumbar spinal stenosis.    Clinical Impression  Mr. Hartter was Ind PTA working full-time and denies any falls in the past 6 months.  He is independent will all mobility during PT evaluation today.  No instability appreciated with high level balance activities while ambulating.  No skilled PT needs identified.  PT will sign off.    Follow Up Recommendations No PT follow up    Equipment Recommendations  None recommended by PT    Recommendations for Other Services       Precautions / Restrictions Precautions Precautions: Fall;Other (comment) Precaution Comments: monitor HR Restrictions Weight Bearing Restrictions: No      Mobility  Bed Mobility Overal bed mobility: Independent             General bed mobility comments: No cues or physical assist.  Independent with supine<>sit.  Transfers Overall transfer level: Independent Equipment used: None             General transfer comment: No instability noted.  No AD need.  Independent.  Ambulation/Gait Ambulation/Gait assistance: Independent Ambulation Distance (Feet): 400 Feet Assistive device: None Gait Pattern/deviations: WFL(Within Functional Limits)   Gait velocity interpretation: at or above normal speed for age/gender General Gait Details: No gait abnormalities appreciated.  Independent.  Pt denies dizziness.   Stairs            Wheelchair Mobility    Modified Rankin (Stroke Patients Only)       Balance Overall balance assessment: Independent                           High  level balance activites: Backward walking;Direction changes;Turns;Sudden stops;Head turns High Level Balance Comments: No instability noted with high level balance activities Standardized Balance Assessment Standardized Balance Assessment : Dynamic Gait Index   Dynamic Gait Index Level Surface: Normal Change in Gait Speed: Normal Gait with Horizontal Head Turns: Normal Gait with Vertical Head Turns: Normal Gait and Pivot Turn: Normal Step Over Obstacle: Normal Step Around Obstacles: Normal       Pertinent Vitals/Pain Pain Assessment: No/denies pain    Home Living Family/patient expects to be discharged to:: Private residence Living Arrangements: Spouse/significant other Available Help at Discharge: Family;Available 24 hours/day Type of Home: Independent living facility Home Access: Level entry     Home Layout: One level Home Equipment: Grab bars - toilet;Cane - single point      Prior Function Level of Independence: Independent         Comments: Pt denies any falls over the past 6 months.  He is independent with all ADLs and mobility.     Hand Dominance        Extremity/Trunk Assessment   Upper Extremity Assessment Upper Extremity Assessment: Overall WFL for tasks assessed    Lower Extremity Assessment Lower Extremity Assessment: Overall WFL for tasks assessed       Communication   Communication: No difficulties  Cognition Arousal/Alertness: Awake/alert Behavior During Therapy: WFL for tasks assessed/performed Overall Cognitive Status: Within Functional Limits for tasks assessed  General Comments General comments (skin integrity, edema, etc.): HR from 60s at rest to 104 with mobility with HR monitor reading VTach at end of ambulation.  RN already aware.    Exercises General Exercises - Lower Extremity Ankle Circles/Pumps: PROM;Both;10 reps;Seated Hip Flexion/Marching: Both;10 reps;Seated Other Exercises Other Exercises:  Pt encouraged to ambulate in hall with nursing staff at least 3x/day. Other Exercises: Pt encouraged to take his time with transitions from supine>sit, sit>stand moving forward. Other Exercises: Pt encouraged to continue performing exercises in sitting and supine during hospital stay.   Assessment/Plan    PT Assessment Patent does not need any further PT services  PT Problem List            PT Treatment Interventions      PT Goals (Current goals can be found in the Care Plan section)  Acute Rehab PT Goals Patient Stated Goal: to return to work tomorrow (pt works full-time) PT Goal Formulation: All assessment and education complete, DC therapy    Frequency     Barriers to discharge        Co-evaluation               End of Session Equipment Utilized During Treatment: Gait belt Activity Tolerance: Patient tolerated treatment well Patient left: in bed;with call bell/phone within reach;with bed alarm set Nurse Communication: Mobility status;Other (comment) (HR (RN already aware))         Time: [id number] PT Time Calculation (min) (ACUTE ONLY): 20 min   Charges:   PT Evaluation $PT Eval Low Complexity: 1 Procedure     PT G CodesCollie Siad PT, DPT 01/03/2017, 10:38 AM

## 2017-01-03 NOTE — Progress Notes (Addendum)
Pt telemetry has shown irregular heartbeat, ventricular bigeminy and ventricular trigeminy with frequent pvcs. MD Huglemeyer made aware. Pacer pads at the bedside. Pt is asymptomatic and states he "doesn't feel any different that usual." Will continue to monitor.

## 2017-01-03 NOTE — Progress Notes (Signed)
Pt telemetry showing sinus rhythm with a heart rate of 66-67. Pt is sleeping comfortably.

## 2017-01-03 NOTE — Progress Notes (Signed)
Pt discharged to home via wc.  Instructions  given to pt.  Questions answered.  No distress.  

## 2017-01-03 NOTE — Care Management Note (Signed)
Case Management Note  Patient Details  Name: Juan Horn MRN: [id number] Date of Birth: [birth year]  Additional Comments: No discharge needs identified by members of care team  Expected Discharge Date:  01/03/17               Expected Discharge Plan:  Home/Self Care  In-House Referral:  NA  Discharge planning Services  NA  Post Acute Care Choice:  NA Choice offered to:  NA  DME Arranged:  N/A DME Agency:  NA  HH Arranged:  NA HH Agency:  NA  Status of Service:  Completed, signed off    Katrina Stack, RN 01/03/2017, 3:29 PM

## 2017-01-03 NOTE — Progress Notes (Signed)
Pt arrived from ED alert and oriented x4. No c/o pain, no SOB. Telemetry box and skin verified with Yasmin RN . No skin issues. No concerns offered at this time.

## 2017-01-04 ENCOUNTER — Telehealth: Payer: Self-pay

## 2017-01-04 NOTE — Telephone Encounter (Signed)
Transition Care Management Follow-up Telephone Call    Date discharged? 01/03/2017  How have you been since you were released from the hospital? [place].   Any patient concerns? Pt is concerned about recent diagnosis of irregular heartbeat. Pt does not feel it is the real cause of his dizziness.    Items Reviewed:  Medications reviewed: Yes  Allergies reviewed: Yes  Dietary changes reviewed: Yes  Referrals reviewed: Yes   Functional Questionnaire:  Independent - I Dependent - D    Activities of Daily Living (ADLs):    Personal hygiene - I Dressing - I Eating - I Maintaining continence - urinary leakage secondary to prostatectomy Transferring - I   Independent Activities of Daily Living (iADLs): Basic communication skills - I Transportation - I Meal preparation  - I Shopping - I Housework - D (external housekeeper) Managing medications - I Managing personal finances - I   Confirmed importance and date/time of follow-up visits scheduled YES  Provider Appointment booked with PCP 01/10/17 @ 1400  Confirmed with patient if condition begins to worsen call PCP or go to the ER.  Patient was given the office number and encouraged to call back with question or concerns: YES

## 2017-01-04 NOTE — Telephone Encounter (Signed)
1st attempt for TCM outreach - left message with contact info on home phone

## 2017-01-09 ENCOUNTER — Ambulatory Visit: Payer: Medicare Other | Admitting: Internal Medicine

## 2017-01-10 ENCOUNTER — Ambulatory Visit (INDEPENDENT_AMBULATORY_CARE_PROVIDER_SITE_OTHER): Payer: Medicare Other | Admitting: Internal Medicine

## 2017-01-10 ENCOUNTER — Encounter: Payer: Self-pay | Admitting: Internal Medicine

## 2017-01-10 VITALS — BP 110/60 | HR 70 | Temp 98.2°F | Wt 193.0 lb

## 2017-01-10 DIAGNOSIS — R42 Dizziness and giddiness: Secondary | ICD-10-CM | POA: Diagnosis not present

## 2017-01-10 NOTE — Progress Notes (Signed)
Pre visit review using our clinic review tool, if applicable. No additional management support is needed unless otherwise documented below in the visit note. 

## 2017-01-10 NOTE — Progress Notes (Signed)
Subjective:    Patient ID: Juan Horn, male    DOB: Aug 12, 1933, 83 y.o.   MRN: [id number]  HPI Here for hospital follow up With wife  Has been feeling well in general When walking down the hall to his computer--he felt very unstable No vertigo--"just like I was drunk" Checked by nurse--HR was only 40 so rescue called  Intermittent bradycardia in ER also PVC's noted [facility] records reviewed  No chest pain No palpitations No focal weakness, aphasia, facial droop  Has felt fine since going home Has echo scheduled next week  Current Outpatient Prescriptions on File Prior to Visit  Medication Sig Dispense Refill  . aspirin 81 MG tablet Take 81 mg by mouth 4 (four) times a week.     . Cholecalciferol (VITAMIN D3 PO) Take 1,000 Units by mouth daily.    Marland Kitchen ibuprofen (ADVIL,MOTRIN) 200 MG tablet Take 600 mg by mouth daily as needed.    . latanoprost (XALATAN) 0.005 % ophthalmic solution Place 1 drop into both eyes at bedtime.    . Multiple Vitamins-Minerals (PRESERVISION AREDS 2 PO) Take 2 tablets by mouth daily.     Marland Kitchen omeprazole (PRILOSEC) 20 MG capsule Take 1 capsule (20 mg total) by mouth daily. (Patient taking differently: Take 20 mg by mouth daily as needed. ) 90 capsule 3  . pravastatin (PRAVACHOL) 20 MG tablet TAKE 1 TABLET DAILY 90 tablet 3   No current facility-administered medications on file prior to visit.     Allergies  Allergen Reactions  . Naphazoline-Polyethyl Glycol Other (See Comments)    (Afgan) redness    Past Medical History:  Diagnosis Date  . Anxiety   . Cancer [facility]) 2010   Prostate Cancer  . Cataract    left eye  . Chronic venous insufficiency   . Clotting disorder ([place]) 2013   blood clot 3 days post knee surgery  . Diverticulosis of colon   . Glaucoma   . HLD (hyperlipidemia)   . Hx of colonic polyp   . Macular degeneration    legally blind in right eye  . OA (osteoarthritis)   . Personal history of prostate cancer   . Pulmonary  embolism ([place]) 11/13   post op TKR  . Spinal stenosis of lumbar region     Past Surgical History:  Procedure Laterality Date  . APPENDECTOMY    . COLONOSCOPY  2011  . INGUINAL HERNIA REPAIR     left  . INGUINAL HERNIA REPAIR  5/12   Dr Priscille Heidelberg  . INGUINAL HERNIA REPAIR  5/12   Dr Jamal Collin did redo of this  . JOINT REPLACEMENT  11/13   Left total knee--Dr Hooten  . KNEE SURGERY  2013  . POLYPECTOMY  2011  . PROSTATECTOMY  2010  . TONSILLECTOMY    . VARICOSE VEIN SURGERY     left    Family History  Problem Relation Age of Onset  . Stroke Father   . Dementia Mother   . Leukemia Sister   . Colon cancer Neg Hx     Social History   Social History  . Marital status: Married    Spouse name: N/A  . Number of children: 0  . Years of education: N/A   Occupational History  . Retired-purchasing for [employer]    Social History Main Topics  . Smoking status: Former Smoker    Types: Cigarettes    Quit date: 12/03/1978  . Smokeless tobacco: Never Used  . Alcohol use  4.2 oz/week    7 Glasses of wine per week  . Drug use: No  . Sexual activity: Not on file   Other Topics Concern  . Not on file   Social History Narrative   Has living will   DNR done 11/12--- he doesn't have it on display though (since he is not sure about this)   Wife is health care POA--then niece Evangeline Dakin    No feeding tube if cognitively unaware   Review of Systems No nausea, vomiting or diarrhea Appetite is okay Sleeping okay No tinnitus No vision changes    Objective:   Physical Exam  Constitutional: He appears well-nourished. No distress.  Eyes: EOM are normal.  No nystagmus  Neck: Normal range of motion. Neck supple. No thyromegaly present.  Cardiovascular: Normal rate, regular rhythm and normal heart sounds.  Exam reveals no gallop.   No murmur heard. HR 60 with occasional skips  Pulmonary/Chest: Effort normal and breath sounds normal. No respiratory distress. He has no  wheezes. He has no rales.  Musculoskeletal: He exhibits no edema.  Lymphadenopathy:    He has no cervical adenopathy.  Neurological: He has normal strength. He exhibits normal muscle tone. He displays a negative Romberg sign. Coordination and gait normal.  Psychiatric: He has a normal mood and affect. His behavior is normal.          Assessment & Plan:

## 2017-01-10 NOTE — Assessment & Plan Note (Addendum)
Not clearly vertigo but had balance problem Was bradycardic--but no clear vagal reaction HR has recovered and no further symptoms Head CT normal--but this could have been a posterior circulation TIA (?hypoperfusion) Is on ASA and statin Makes sense to proceed with the echo Would also consider brain MRI if recurrent spells

## 2017-01-16 ENCOUNTER — Telehealth: Payer: Self-pay | Admitting: *Deleted

## 2017-01-16 DIAGNOSIS — J22 Unspecified acute lower respiratory infection: Secondary | ICD-10-CM | POA: Diagnosis not present

## 2017-01-16 NOTE — Telephone Encounter (Signed)
PT sent in the following message via MyChart. Please advise.   Appointment Request From: Charm Barges    With Provider: Viviana Simpler, MD [facility] [place]    Preferred Date Range: From 01/17/2017 To 01/17/2017    Preferred Times: Any    Reason for visit: Office Visit    Comments:  I am concerned about a cough that I have developed for about a week. I was unable to sleep Sunday and Monday nights because of the constant coughing. Monday I felt very weak and went to our clinic on campus where [place] checked me out and said all looked OK. However, not sleeping last night I felt so bad today that I canceled all activities and started DaQuil/NyQuil. Every time I get a cough it goes into my chest and into bronchitis. Could you give me something to help this?  Thank you,  Valerie Salts

## 2017-01-16 NOTE — Telephone Encounter (Signed)
I spoke to patient's wife and scheduled appointment for 01/17/17 at 3:15.

## 2017-01-16 NOTE — Telephone Encounter (Signed)
Please get him in today or tomorrow (I know I have appts for tomorrow)

## 2017-01-17 ENCOUNTER — Ambulatory Visit: Payer: Medicare Other | Admitting: Primary Care

## 2017-01-17 ENCOUNTER — Ambulatory Visit: Payer: Medicare Other | Admitting: Internal Medicine

## 2017-01-18 DIAGNOSIS — R001 Bradycardia, unspecified: Secondary | ICD-10-CM | POA: Diagnosis not present

## 2017-01-22 ENCOUNTER — Encounter: Payer: Self-pay | Admitting: Internal Medicine

## 2017-01-22 ENCOUNTER — Ambulatory Visit (INDEPENDENT_AMBULATORY_CARE_PROVIDER_SITE_OTHER): Payer: Medicare Other | Admitting: Internal Medicine

## 2017-01-22 VITALS — BP 118/70 | HR 53 | Temp 97.3°F | Wt 190.0 lb

## 2017-01-22 DIAGNOSIS — R001 Bradycardia, unspecified: Secondary | ICD-10-CM | POA: Diagnosis not present

## 2017-01-22 DIAGNOSIS — F321 Major depressive disorder, single episode, moderate: Secondary | ICD-10-CM

## 2017-01-22 DIAGNOSIS — Z86718 Personal history of other venous thrombosis and embolism: Secondary | ICD-10-CM | POA: Insufficient documentation

## 2017-01-22 DIAGNOSIS — E785 Hyperlipidemia, unspecified: Secondary | ICD-10-CM | POA: Diagnosis not present

## 2017-01-22 DIAGNOSIS — F3342 Major depressive disorder, recurrent, in full remission: Secondary | ICD-10-CM | POA: Insufficient documentation

## 2017-01-22 DIAGNOSIS — I872 Venous insufficiency (chronic) (peripheral): Secondary | ICD-10-CM | POA: Diagnosis not present

## 2017-01-22 DIAGNOSIS — I493 Ventricular premature depolarization: Secondary | ICD-10-CM | POA: Diagnosis not present

## 2017-01-22 MED ORDER — SERTRALINE HCL 50 MG PO TABS: 50.0000 mg | ORAL_TABLET | Freq: Every day | ORAL | 3 refills | Status: DC

## 2017-01-22 NOTE — Assessment & Plan Note (Signed)
Discussed the diagnosis---alprazolam not the right Rx for this Discussed forcing himself to get out and do things Discussed counselor--he prefers to wait on this Will start SSRI--sertraline. Counseled on side effects--esp SI (though rare)

## 2017-01-22 NOTE — Progress Notes (Signed)
Subjective:    Patient ID: Juan Horn, male    DOB: September 26, 1933, 83 y.o.   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 aspirin 81 MG tablet Take 81 mg by mouth 4 (four) times a week.     . Cholecalciferol (VITAMIN D3 PO) Take 1,000 Units by mouth daily.    Marland Kitchen ibuprofen (ADVIL,MOTRIN) 200 MG tablet Take 600 mg by mouth daily as needed.    . latanoprost (XALATAN) 0.005 % ophthalmic solution Place 1 drop into both eyes at bedtime.    . Multiple Vitamins-Minerals (PRESERVISION AREDS 2 PO) Take 2 tablets by mouth daily.     Marland Kitchen omeprazole (PRILOSEC) 20 MG capsule Take 1 capsule (20 mg total) by mouth daily. (Patient taking differently: Take 20 mg by mouth daily as needed. ) 90 capsule 3  . pravastatin (PRAVACHOL) 20 MG tablet TAKE 1 TABLET DAILY 90 tablet 3   No current facility-administered medications on file prior to visit.     Allergies  Allergen Reactions  . Naphazoline-Polyethyl Glycol Other (See Comments)    (Afgan) redness    Past Medical History:  Diagnosis Date  . Anxiety   . Cancer [facility]) 2010   Prostate Cancer  . Cataract    left eye  . Chronic venous insufficiency   . Clotting disorder ([place]) 2013   blood clot 3 days post knee surgery  . Diverticulosis of colon   .  Glaucoma   . HLD (hyperlipidemia)   . Hx of colonic polyp   . Macular degeneration    legally blind in right eye  . OA (osteoarthritis)   . Personal history of prostate cancer   . Pulmonary embolism ([place]) 11/13   post op TKR  . Spinal stenosis of lumbar region     Past Surgical History:  Procedure Laterality Date  . APPENDECTOMY    . COLONOSCOPY  2011  . INGUINAL HERNIA REPAIR     left  . INGUINAL HERNIA REPAIR  5/12   Dr Priscille Heidelberg  . INGUINAL HERNIA REPAIR  5/12   Dr Jamal Collin did redo of this  . JOINT REPLACEMENT  11/13   Left total knee--Dr Hooten  . KNEE SURGERY  2013  . POLYPECTOMY  2011  . PROSTATECTOMY  2010  . TONSILLECTOMY    . VARICOSE VEIN SURGERY     left    Family History  Problem Relation Age of Onset  . Stroke Father   . Dementia Mother   . Leukemia Sister   . Colon cancer Neg Hx     Social History   Social History  . Marital status: Married    Spouse name: N/A  . Number of children: 0  . Years of education: N/A   Occupational History  . Retired-purchasing for [employer]  History Main Topics  . Smoking status: Former Smoker    Types: Cigarettes    Quit date: 12/03/1978  . Smokeless tobacco: Never Used  . Alcohol use 4.2 oz/week    7 Glasses of wine per week  . Drug use: No  . Sexual activity: Not on file   Other Topics Concern  . Not on file   Social History Narrative   Has living will   DNR done 11/12--- he doesn't have it on display though (since he is not sure about this)   Wife is health care POA--then niece Evangeline Dakin    No feeding tube if cognitively unaware   Review of Systems Appetite is improving now since off the antibiotic--but "not the way it used to be" Some hypersomnolence-- "I want to sleep to get away from everything" Limited by spinal stenosis and knees--hard to even take a walk down the block    Objective:   Physical Exam  Constitutional: He appears well-nourished. No distress.  Psychiatric:    Tearful and upset Appropriate appearance and speech otherwise          Assessment & Plan:

## 2017-01-22 NOTE — Patient Instructions (Signed)
Please start the setraline at 50mg  daily. Let me know if you have any significant problems with this (especially very rare side effect of  suicidal thoughts).

## 2017-01-22 NOTE — Progress Notes (Signed)
Pre visit review using our clinic review tool, if applicable. No additional management support is needed unless otherwise documented below in the visit note. 

## 2017-01-25 ENCOUNTER — Telehealth: Payer: Self-pay | Admitting: Internal Medicine

## 2017-01-25 ENCOUNTER — Encounter: Payer: Self-pay | Admitting: Internal Medicine

## 2017-01-25 ENCOUNTER — Ambulatory Visit (INDEPENDENT_AMBULATORY_CARE_PROVIDER_SITE_OTHER): Payer: Medicare Other | Admitting: Internal Medicine

## 2017-01-25 VITALS — BP 122/80 | HR 62 | Temp 97.5°F | Wt 187.0 lb

## 2017-01-25 DIAGNOSIS — F321 Major depressive disorder, single episode, moderate: Secondary | ICD-10-CM | POA: Diagnosis not present

## 2017-01-25 MED ORDER — METHYLPHENIDATE HCL 5 MG PO TABS: 5.0000 mg | ORAL_TABLET | Freq: Two times a day (BID) | ORAL | 0 refills | Status: DC

## 2017-01-25 NOTE — Patient Instructions (Signed)
Please try 1/2 of the methylphenidate at lunch today--then after breakfast and lunch from then on. If you have no problems with it after 2 days or so, you can increase to the full tab twice a day.

## 2017-01-25 NOTE — Progress Notes (Signed)
Pre visit review using our clinic review tool, if applicable. No additional management support is needed unless otherwise documented below in the visit note. 

## 2017-01-25 NOTE — Telephone Encounter (Signed)
I spoke with Dr Silvio Pate and he will add pt today at 12:45. Mrs Arnn appreciative and scheduled appt.

## 2017-01-25 NOTE — Telephone Encounter (Signed)
Will see then. 

## 2017-01-25 NOTE — Progress Notes (Signed)
Subjective:    Patient ID: Juan Horn, male    DOB: 1933/07/24, 83 y.o.   MRN: [id number]  HPI Here with wife Depression seems to be worse  Has been taking the sertraline No apparent side effects No suicidal ideation Having trouble functioning--- tried to get ready to go to senior center then said "I just can't do it"  Will feel good for 10 minutes after awakening--then tired again Can't concentrate to read book  Current Outpatient Prescriptions on File Prior to Visit  Medication Sig Dispense Refill  . aspirin 81 MG tablet Take 81 mg by mouth 4 (four) times a week.     . benzonatate (TESSALON) 100 MG capsule Take 2 capsules by mouth 3 (three) times daily as needed.    . Cholecalciferol (VITAMIN D3 PO) Take 1,000 Units by mouth daily.    Marland Kitchen ibuprofen (ADVIL,MOTRIN) 200 MG tablet Take 600 mg by mouth daily as needed.    . latanoprost (XALATAN) 0.005 % ophthalmic solution Place 1 drop into both eyes at bedtime.    . Multiple Vitamins-Minerals (PRESERVISION AREDS 2 PO) Take 2 tablets by mouth daily.     Marland Kitchen omeprazole (PRILOSEC) 20 MG capsule Take 1 capsule (20 mg total) by mouth daily. (Patient taking differently: Take 20 mg by mouth daily as needed. ) 90 capsule 3  . pravastatin (PRAVACHOL) 20 MG tablet TAKE 1 TABLET DAILY 90 tablet 3  . sertraline (ZOLOFT) 50 MG tablet Take 1 tablet (50 mg total) by mouth daily. 30 tablet 3   No current facility-administered medications on file prior to visit.     Allergies  Allergen Reactions  . Naphazoline-Polyethyl Glycol Other (See Comments)    (Afgan) redness    Past Medical History:  Diagnosis Date  . Anxiety   . Cancer [facility]) 2010   Prostate Cancer  . Cataract    left eye  . Chronic venous insufficiency   . Clotting disorder ([place]) 2013   blood clot 3 days post knee surgery  . Diverticulosis of colon   . Glaucoma   . HLD (hyperlipidemia)   . Hx of colonic polyp   . Macular degeneration    legally blind in right eye    . OA (osteoarthritis)   . Personal history of prostate cancer   . Pulmonary embolism ([place]) 11/13   post op TKR  . Spinal stenosis of lumbar region     Past Surgical History:  Procedure Laterality Date  . APPENDECTOMY    . COLONOSCOPY  2011  . INGUINAL HERNIA REPAIR     left  . INGUINAL HERNIA REPAIR  5/12   Dr Priscille Heidelberg  . INGUINAL HERNIA REPAIR  5/12   Dr Jamal Collin did redo of this  . JOINT REPLACEMENT  11/13   Left total knee--Dr Hooten  . KNEE SURGERY  2013  . POLYPECTOMY  2011  . PROSTATECTOMY  2010  . TONSILLECTOMY    . VARICOSE VEIN SURGERY     left    Family History  Problem Relation Age of Onset  . Stroke Father   . Dementia Mother   . Leukemia Sister   . Colon cancer Neg Hx     Social History   Social History  . Marital status: Married    Spouse name: N/A  . Number of children: 0  . Years of education: N/A   Occupational History  . Retired-purchasing for [employer]    Social History Main Topics  . Smoking status: Former Smoker  Types: Cigarettes    Quit date: 12/03/1978  . Smokeless tobacco: Never Used  . Alcohol use 4.2 oz/week    7 Glasses of wine per week  . Drug use: No  . Sexual activity: Not on file   Other Topics Concern  . Not on file   Social History Narrative   Has living will   DNR done 11/12--- he doesn't have it on display though (since he is not sure about this)   Wife is health care POA--then niece Evangeline Dakin    No feeding tube if cognitively unaware   Review of Systems Not really irritable on the medication Not eating---taste is off    Objective:   Physical Exam  Psychiatric:  Alert Normal speech          Assessment & Plan:

## 2017-01-25 NOTE — Telephone Encounter (Signed)
[facility] Patient Name: Juan Horn DOB: 12/17/1932 Initial Comment Caller states her husband has been diagnosed with depression. He is on Zoloft and is not feeling better. He is still in a depressed state. Would like to know if there is anything else he can do. He is withdrawing from everything and just wants to be in bed and sleep. She has never seen him in this condition. Wants to know if there is anything faster acting that can be prescribed. Nurse Assessment Nurse: Dimas Chyle, RN, Dellis Filbert Date/Time Eilene Ghazi Time): 01/25/2017 10:48:12 AM Confirm and document reason for call. If symptomatic, describe symptoms. ---Caller states her husband has been diagnosed with depression. He is on Zoloft and is not feeling better. He is still in a depressed state. Would like to know if there is anything else he can do. He is withdrawing from everything and just wants to be in bed and sleep. She has never seen him in this condition. Wants to know if there is anything faster acting that can be prescribed. Seen in office on Tuesday and started on medication. Does the patient have any new or worsening symptoms? ---Yes Will a triage be completed? ---Yes Related visit to physician within the last 2 weeks? ---Yes Does the PT have any chronic conditions? (i.e. diabetes, asthma, etc.) ---Yes List chronic conditions. ---Depression Is this a behavioral health or substance abuse call? ---No Guidelines Guideline Title Affirmed Question Affirmed Notes Depression [1] Depression AND [2] worsening (e.g.,sleeping poorly, less able to do activities of daily living) Final Disposition User See Physician within [place], RN, [place] caller to call back after noon for possible appointment at [facility] clinic on Saturday. Referrals REFERRED TO PCP OFFICE [facility] Saturday  Clinic Disagree/Comply: Comply

## 2017-01-25 NOTE — Assessment & Plan Note (Signed)
Doesn't appear to have had an adverse reaction to sertraline--but too soon to decide about increase Will add ritalin as adjunctive Rx

## 2017-01-26 ENCOUNTER — Encounter: Payer: Self-pay | Admitting: Internal Medicine

## 2017-01-27 ENCOUNTER — Encounter: Payer: Self-pay | Admitting: Internal Medicine

## 2017-01-28 MED ORDER — METHYLPHENIDATE HCL 5 MG PO TABS: 5.0000 mg | ORAL_TABLET | Freq: Two times a day (BID) | ORAL | 0 refills | Status: DC

## 2017-01-28 NOTE — Telephone Encounter (Signed)
Summer from [place] wanted to make sure Dr Silvio Pate was aware that methylfenidate is contraindicated with pt who have glaucoma. I read the note from Dr Silvio Pate on this phone note and Summer said that was OK just wanted to make sure Dr Silvio Pate aware.FYI to Dr Silvio Pate.

## 2017-01-28 NOTE — Telephone Encounter (Signed)
They came and got the rx

## 2017-01-28 NOTE — Telephone Encounter (Signed)
Please let her know this is ready. Was warned not to take it due to small risk with glaucoma. Okay to try--if he does well with it, and is going to continue, should go to eye doctor in the next couple of weeks to make sure eye pressure is still okay.

## 2017-01-29 DIAGNOSIS — I493 Ventricular premature depolarization: Secondary | ICD-10-CM | POA: Diagnosis not present

## 2017-02-05 DIAGNOSIS — R05 Cough: Secondary | ICD-10-CM | POA: Diagnosis not present

## 2017-02-05 DIAGNOSIS — Z86718 Personal history of other venous thrombosis and embolism: Secondary | ICD-10-CM | POA: Diagnosis not present

## 2017-02-05 DIAGNOSIS — E785 Hyperlipidemia, unspecified: Secondary | ICD-10-CM | POA: Diagnosis not present

## 2017-02-05 DIAGNOSIS — R001 Bradycardia, unspecified: Secondary | ICD-10-CM | POA: Diagnosis not present

## 2017-02-05 DIAGNOSIS — I872 Venous insufficiency (chronic) (peripheral): Secondary | ICD-10-CM | POA: Diagnosis not present

## 2017-02-05 DIAGNOSIS — I493 Ventricular premature depolarization: Secondary | ICD-10-CM | POA: Diagnosis not present

## 2017-02-11 ENCOUNTER — Ambulatory Visit (INDEPENDENT_AMBULATORY_CARE_PROVIDER_SITE_OTHER): Payer: Medicare Other | Admitting: Internal Medicine

## 2017-02-11 ENCOUNTER — Encounter: Payer: Self-pay | Admitting: Internal Medicine

## 2017-02-11 VITALS — BP 106/68 | HR 60 | Temp 98.1°F | Wt 186.5 lb

## 2017-02-11 DIAGNOSIS — F321 Major depressive disorder, single episode, moderate: Secondary | ICD-10-CM | POA: Diagnosis not present

## 2017-02-11 MED ORDER — METHYLPHENIDATE HCL 5 MG PO TABS: 5.0000 mg | ORAL_TABLET | Freq: Two times a day (BID) | ORAL | 0 refills | Status: DC

## 2017-02-11 NOTE — Progress Notes (Signed)
Subjective:    Patient ID: Juan Horn, male    DOB: 10-Jun-1933, 83 y.o.   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 aspirin 81 MG tablet Take 81 mg by mouth 4 (four) times a week.     . benzonatate (TESSALON) 100 MG capsule Take 2 capsules by mouth 3 (three) times daily as needed.    . Cholecalciferol (VITAMIN D3 PO) Take 1,000 Units by mouth daily.    Marland Kitchen ibuprofen (ADVIL,MOTRIN) 200 MG tablet Take 600 mg by mouth daily as needed.    . latanoprost (XALATAN) 0.005 % ophthalmic solution Place 1 drop into both eyes at bedtime.    . methylphenidate (RITALIN) 5 MG tablet Take 1 tablet (5 mg total) by mouth 2 (two) times daily. 60 tablet 0  . Multiple Vitamins-Minerals (PRESERVISION AREDS 2 PO) Take 2 tablets by mouth daily.     Marland Kitchen omeprazole (PRILOSEC) 20 MG capsule Take 1 capsule (20 mg total) by mouth daily. (Patient taking differently: Take 20 mg by mouth daily as needed. ) 90 capsule 3  . pravastatin (PRAVACHOL) 20 MG tablet TAKE 1 TABLET DAILY 90 tablet 3  . sertraline (ZOLOFT) 50 MG tablet Take 1 tablet (50 mg total) by mouth daily. 30 tablet 3   No current facility-administered medications on file prior to visit.     Allergies  Allergen Reactions  . Naphazoline-Polyethyl Glycol Other (See Comments)    (Afgan) redness    Past Medical History:  Diagnosis Date  . Anxiety   . Cancer [facility]) 2010   Prostate Cancer  . Cataract    left eye  . Chronic venous insufficiency   . Clotting disorder ([place]) 2013   blood clot 3 days post knee surgery  . Diverticulosis of colon   . Glaucoma   . HLD (hyperlipidemia)   . Hx of colonic polyp   . Macular degeneration    legally  blind in right eye  . OA (osteoarthritis)   . Personal history of prostate cancer   . Pulmonary embolism ([place]) 11/13   post op TKR  . Spinal stenosis of lumbar region     Past Surgical History:  Procedure Laterality Date  . APPENDECTOMY    . COLONOSCOPY  2011  . INGUINAL HERNIA REPAIR     left  . INGUINAL HERNIA REPAIR  5/12   Dr Priscille Heidelberg  . INGUINAL HERNIA REPAIR  5/12   Dr Jamal Collin did redo of this  . JOINT REPLACEMENT  11/13   Left total knee--Dr Hooten  . KNEE SURGERY  2013  . POLYPECTOMY  2011  . PROSTATECTOMY  2010  . TONSILLECTOMY    . VARICOSE VEIN SURGERY     left    Family History  Problem Relation Age of Onset  . Stroke Father   . Dementia Mother   . Leukemia Sister   . Colon cancer Neg Hx     Social History   Social History  . Marital status: Married    Spouse name: N/A  . Number of children: 0  . Years of education: N/A   Occupational History  . Retired-purchasing for [employer]    Social History Main Topics  .  Smoking status: Former Smoker    Types: Cigarettes    Quit date: 12/03/1978  . Smokeless tobacco: Never Used  . Alcohol use 4.2 oz/week    7 Glasses of wine per week  . Drug use: No  . Sexual activity: Not on file   Other Topics Concern  . Not on file   Social History Narrative   Has living will   DNR done 11/12--- he doesn't have it on display though (since he is not sure about this)   Wife is health care POA--then niece Evangeline Dakin    No feeding tube if cognitively unaware   Review of Systems  Appetite is fine--trying to eat healthy Sleeps well No vision changes Recent respiratory illness---went to urgent care at [facility] and is now improved     Objective:   Physical Exam  Psychiatric:  Bright and talkative Mood is upbeat          Assessment & Plan:

## 2017-02-11 NOTE — Progress Notes (Signed)
Pre visit review using our clinic review tool, if applicable. No additional management support is needed unless otherwise documented below in the visit note. 

## 2017-02-11 NOTE — Assessment & Plan Note (Signed)
Marked improvement with the ritalin Discussed that he needs to continue Will get eye pressure checked due to theoretical interaction with the glaucoma

## 2017-02-14 DIAGNOSIS — M48062 Spinal stenosis, lumbar region with neurogenic claudication: Secondary | ICD-10-CM | POA: Diagnosis not present

## 2017-02-14 DIAGNOSIS — M5416 Radiculopathy, lumbar region: Secondary | ICD-10-CM | POA: Diagnosis not present

## 2017-02-14 DIAGNOSIS — M5136 Other intervertebral disc degeneration, lumbar region: Secondary | ICD-10-CM | POA: Diagnosis not present

## 2017-02-22 DIAGNOSIS — H401134 Primary open-angle glaucoma, bilateral, indeterminate stage: Secondary | ICD-10-CM | POA: Diagnosis not present

## 2017-02-24 ENCOUNTER — Encounter: Payer: Self-pay | Admitting: Internal Medicine

## 2017-03-18 DIAGNOSIS — H401134 Primary open-angle glaucoma, bilateral, indeterminate stage: Secondary | ICD-10-CM | POA: Diagnosis not present

## 2017-03-20 ENCOUNTER — Other Ambulatory Visit: Payer: Self-pay | Admitting: *Deleted

## 2017-03-20 MED ORDER — SERTRALINE HCL 50 MG PO TABS: 50.0000 mg | ORAL_TABLET | Freq: Every day | ORAL | 1 refills | Status: DC

## 2017-03-20 NOTE — Telephone Encounter (Signed)
Pt requesting 90D refill

## 2017-03-24 ENCOUNTER — Other Ambulatory Visit: Payer: Self-pay | Admitting: Internal Medicine

## 2017-03-25 ENCOUNTER — Ambulatory Visit (INDEPENDENT_AMBULATORY_CARE_PROVIDER_SITE_OTHER)
Admission: RE | Admit: 2017-03-25 | Discharge: 2017-03-25 | Disposition: A | Discharge status: Home / Self Care | Payer: Medicare Other | Source: Ambulatory Visit | Attending: Internal Medicine | Admitting: Internal Medicine

## 2017-03-25 DIAGNOSIS — R938 Abnormal findings on diagnostic imaging of other specified body structures: Secondary | ICD-10-CM

## 2017-03-25 DIAGNOSIS — R918 Other nonspecific abnormal finding of lung field: Secondary | ICD-10-CM | POA: Diagnosis not present

## 2017-03-25 DIAGNOSIS — R9389 Abnormal findings on diagnostic imaging of other specified body structures: Secondary | ICD-10-CM

## 2017-03-25 MED ORDER — METHYLPHENIDATE HCL 5 MG PO TABS: 5.0000 mg | ORAL_TABLET | Freq: Two times a day (BID) | ORAL | 0 refills | Status: DC

## 2017-03-25 NOTE — Telephone Encounter (Signed)
3 month Rx written Find out if he will need some locally till it comes in the mail

## 2017-03-25 NOTE — Telephone Encounter (Signed)
Spoke to pt and informed him Rx is available for pickup from the front desk. Pt states he will take it to the local pharmacy to fill as it will not be delivered by 4/30 and will use mail order next Rx

## 2017-04-08 DIAGNOSIS — H353212 Exudative age-related macular degeneration, right eye, with inactive choroidal neovascularization: Secondary | ICD-10-CM | POA: Diagnosis not present

## 2017-04-12 ENCOUNTER — Ambulatory Visit: Payer: Medicare Other | Admitting: Internal Medicine

## 2017-04-12 ENCOUNTER — Ambulatory Visit (INDEPENDENT_AMBULATORY_CARE_PROVIDER_SITE_OTHER): Payer: Medicare Other | Admitting: Internal Medicine

## 2017-04-12 ENCOUNTER — Encounter: Payer: Self-pay | Admitting: Internal Medicine

## 2017-04-12 VITALS — BP 132/84 | HR 63 | Temp 97.5°F | Ht 66.75 in | Wt 184.0 lb

## 2017-04-12 DIAGNOSIS — Z0000 Encounter for general adult medical examination without abnormal findings: Secondary | ICD-10-CM | POA: Diagnosis not present

## 2017-04-12 DIAGNOSIS — E785 Hyperlipidemia, unspecified: Secondary | ICD-10-CM

## 2017-04-12 DIAGNOSIS — M48062 Spinal stenosis, lumbar region with neurogenic claudication: Secondary | ICD-10-CM | POA: Diagnosis not present

## 2017-04-12 DIAGNOSIS — I872 Venous insufficiency (chronic) (peripheral): Secondary | ICD-10-CM

## 2017-04-12 DIAGNOSIS — F321 Major depressive disorder, single episode, moderate: Secondary | ICD-10-CM | POA: Diagnosis not present

## 2017-04-12 DIAGNOSIS — Z8546 Personal history of malignant neoplasm of prostate: Secondary | ICD-10-CM

## 2017-04-12 DIAGNOSIS — Z7189 Other specified counseling: Secondary | ICD-10-CM

## 2017-04-12 LAB — LIPID PANEL
CHOL/HDL RATIO: 4
Cholesterol: 187 mg/dL (ref 0–200)
HDL: 51.2 mg/dL (ref >39.00)
LDL Cholesterol: 102 mg/dL — ABNORMAL HIGH (ref 0–99)
NONHDL: 135.61
Triglycerides: 166 mg/dL — ABNORMAL HIGH (ref 0.0–149.0)
VLDL: 33.2 mg/dL (ref 0.0–40.0)

## 2017-04-12 MED ORDER — SERTRALINE HCL 50 MG PO TABS: 50.0000 mg | ORAL_TABLET | Freq: Every day | ORAL | 3 refills | Status: DC

## 2017-04-12 NOTE — Assessment & Plan Note (Signed)
I have personally reviewed the Medicare Annual Wellness questionnaire and have noted 1. The patient's medical and social history 2. Their use of alcohol, tobacco or illicit drugs 3. Their current medications and supplements 4. The patient's functional ability including ADL's, fall risks, home safety risks and hearing or visual             impairment. 5. Diet and physical activities 6. Evidence for depression or mood disorders  The patients weight, height, BMI and visual acuity have been recorded in the chart I have made referrals, counseling and provided education to the patient based review of the above and I have provided the pt with a written personalized care plan for preventive services.  I have provided you with a copy of your personalized plan for preventive services. Please take the time to review along with your updated medication list.  UTD on vaccines---yearly flu vaccine No cancer screening due to age Discussed exercise regimen

## 2017-04-12 NOTE — Assessment & Plan Note (Signed)
Will recheck PSA

## 2017-04-12 NOTE — Progress Notes (Signed)
Subjective:    Patient ID: Juan Horn, male    DOB: 1933-09-23, 83 y.o.   MRN: [id number]  HPI Here for Medicare wellness visit and follow up of chronic health conditions Reviewed form and advanced directives Reviewed other doctors White wine 1-2 some days No tobacco Tries to stay active --not really exercising per se (does go to the gym at times) Vision is fair--may need cataract extraction. No longer drives at night for the most part Hearing is okay No falls Independent with instrumental ADLs He doesn't note significant memory issues  Mood is good Depression seems to be in remission Eye pressures were okay on the ritalin  Back has continued to bother him Not getting any more shots Uses ibuprofen before golf and prn--not that much  GERD is not active Only uses the omeprazole if he is taking the ibuprofen  Continues on the statin Comfortable with primary prevention still No apparent recurrence of symptomatic bradycardia--can get heart rate to 120-130 with exertion Will go back to Dr Saralyn Pilar next month No chest pain No SOB No dizziness or syncope  Current Outpatient Prescriptions on File Prior to Visit  Medication Sig Dispense Refill  . aspirin 81 MG tablet Take 81 mg by mouth 4 (four) times a week.     . Cholecalciferol (VITAMIN D3 PO) Take 1,000 Units by mouth daily.    Marland Kitchen ibuprofen (ADVIL,MOTRIN) 200 MG tablet Take 600 mg by mouth daily as needed.    . latanoprost (XALATAN) 0.005 % ophthalmic solution Place 1 drop into both eyes at bedtime.    . methylphenidate (RITALIN) 5 MG tablet Take 1 tablet (5 mg total) by mouth 2 (two) times daily. 180 tablet 0  . Multiple Vitamins-Minerals (PRESERVISION AREDS 2 PO) Take 2 tablets by mouth daily.     Marland Kitchen omeprazole (PRILOSEC) 20 MG capsule Take 1 capsule (20 mg total) by mouth daily. (Patient taking differently: Take 20 mg by mouth daily as needed. ) 90 capsule 3  . pravastatin (PRAVACHOL) 20 MG tablet TAKE 1 TABLET  DAILY 90 tablet 3  . sertraline (ZOLOFT) 50 MG tablet Take 1 tablet (50 mg total) by mouth daily. 90 tablet 1   No current facility-administered medications on file prior to visit.     Allergies  Allergen Reactions  . Naphazoline-Polyethyl Glycol Other (See Comments)    (Afgan) redness    Past Medical History:  Diagnosis Date  . Anxiety   . Cancer [facility]) 2010   Prostate Cancer  . Cataract    left eye  . Chronic venous insufficiency   . Clotting disorder ([place]) 2013   blood clot 3 days post knee surgery  . Diverticulosis of colon   . Glaucoma   . HLD (hyperlipidemia)   . Hx of colonic polyp   . Macular degeneration    legally blind in right eye  . OA (osteoarthritis)   . Personal history of prostate cancer   . Pulmonary embolism ([place]) 11/13   post op TKR  . Spinal stenosis of lumbar region     Past Surgical History:  Procedure Laterality Date  . APPENDECTOMY    . COLONOSCOPY  2011  . INGUINAL HERNIA REPAIR     left  . INGUINAL HERNIA REPAIR  5/12   Dr Priscille Heidelberg  . INGUINAL HERNIA REPAIR  5/12   Dr Jamal Collin did redo of this  . JOINT REPLACEMENT  11/13   Left total knee--Dr Hooten  . KNEE SURGERY  2013  . POLYPECTOMY  2011  . PROSTATECTOMY  2010  . TONSILLECTOMY    . VARICOSE VEIN SURGERY     left    Family History  Problem Relation Age of Onset  . Stroke Father   . Dementia Mother   . Leukemia Sister   . Colon cancer Neg Hx     Social History   Social History  . Marital status: Married    Spouse name: N/A  . Number of children: 0  . Years of education: N/A   Occupational History  . Retired-purchasing for [employer]    Social History Main Topics  . Smoking status: Former Smoker    Types: Cigarettes    Quit date: 12/03/1978  . Smokeless tobacco: Never Used  . Alcohol use 4.2 oz/week    7 Glasses of wine per week  . Drug use: No  . Sexual activity: Not on file   Other Topics Concern  . Not on file   Social History Narrative   Has living  will   DNR done 11/12--- he doesn't have it on display though (since he is not sure about this)   Wife is health care POA--then niece Evangeline Dakin    No feeding tube if cognitively unaware   Review of Systems Appetite is good Weight is down 8# from last year Sleeps well Wears seat belt Regular with dentist-- Dr Marijo Conception are fine--no blood Voids okay--- was having some leakage before, not evident now Nocturia is only occasional No skin rash or suspicious lesion--sees Dr Nicole Kindred every 6 months    Objective:   Physical Exam  Constitutional: He is oriented to person, place, and time. He appears well-nourished. No distress.  HENT:  Mouth/Throat: Oropharynx is clear and moist. No oropharyngeal exudate.  Neck: No thyromegaly present.  Cardiovascular: Normal rate, regular rhythm, normal heart sounds and intact distal pulses.   Rate 60 now  Pulmonary/Chest: Effort normal and breath sounds normal. No respiratory distress. He has no wheezes. He has no rales.  Abdominal: Soft. There is no tenderness.  Musculoskeletal: He exhibits no edema or tenderness.  Lymphadenopathy:    He has no cervical adenopathy.  Neurological: He is alert and oriented to person, place, and time.  President-- "Trump, Obama, Bush, Clinton, Bush" [phone] [id number] Recall 3/3  Skin: No rash noted. No erythema.  Psychiatric: He has a normal mood and affect. His behavior is normal.          Assessment & Plan:

## 2017-04-12 NOTE — Assessment & Plan Note (Signed)
Only slight edema No Rx

## 2017-04-12 NOTE — Assessment & Plan Note (Signed)
Has DNR but still doesn't display it Discussed but unclear why he does that

## 2017-04-12 NOTE — Assessment & Plan Note (Signed)
No problems with statin 

## 2017-04-12 NOTE — Assessment & Plan Note (Signed)
This is better Done with ESI Uses ibuprofen prn

## 2017-04-12 NOTE — Assessment & Plan Note (Signed)
Finally in remission Will continue both meds for now--consider weaning off methylphenidate in 4 months at follow up

## 2017-04-15 LAB — PSA, TOTAL WITH REFLEX TO PSA, FREE: PSA, Total: <0.1 ng/mL (ref <=4.0)

## 2017-04-15 NOTE — Progress Notes (Signed)
New PT note to include G codes.    01-21-2017 0859  PT G-Codes **NOT FOR INPATIENT CLASS**  Functional Assessment Tool Used Clinical judgement  Functional Limitation Mobility: Walking and moving around  Mobility: Walking and Moving Around Current Status (T6144) CH  Mobility: Walking and Moving Around Goal Status (R1540) CH  Mobility: Walking and Moving Around Discharge Status (G8676) [place] PT, DPT

## 2017-04-19 ENCOUNTER — Encounter: Payer: Medicare Other | Admitting: Internal Medicine

## 2017-05-13 DIAGNOSIS — I493 Ventricular premature depolarization: Secondary | ICD-10-CM | POA: Diagnosis not present

## 2017-05-13 DIAGNOSIS — R002 Palpitations: Secondary | ICD-10-CM | POA: Diagnosis not present

## 2017-05-13 DIAGNOSIS — I872 Venous insufficiency (chronic) (peripheral): Secondary | ICD-10-CM | POA: Diagnosis not present

## 2017-05-13 DIAGNOSIS — E785 Hyperlipidemia, unspecified: Secondary | ICD-10-CM | POA: Diagnosis not present

## 2017-05-30 ENCOUNTER — Encounter: Payer: Self-pay | Admitting: Internal Medicine

## 2017-05-30 ENCOUNTER — Ambulatory Visit (INDEPENDENT_AMBULATORY_CARE_PROVIDER_SITE_OTHER): Payer: Medicare Other | Admitting: Internal Medicine

## 2017-05-30 VITALS — BP 116/70 | HR 70 | Temp 97.6°F | Wt 183.0 lb

## 2017-05-30 DIAGNOSIS — R1013 Epigastric pain: Secondary | ICD-10-CM

## 2017-05-30 NOTE — Patient Instructions (Signed)
Please continue the omeprazole 20mg  daily on an empty stomach. If your discomfort goes away, take it every day for 3-4 weeks, then you can cut back to every other day (but continue). If your symptoms persist after 1-2 weeks, let me know and we will start some testing.

## 2017-05-30 NOTE — Progress Notes (Signed)
Subjective:    Patient ID: Juan Horn, male    DOB: 05-14-1933, 83 y.o.   MRN: [id number]  HPI Here due to abdominal pain--with wife  Has had an uncomfortable feeling in his abdomen for 2-3 weeks periumbilical Not quite indigestion Has had some intermittent diarrhea--not persistent Rolaids/tums no help Not clearly after eating but the diarrhea is Fairly persistent-- but waxes and wanes  Tried prilosec twice--no clear help (but might have) Only uses his ibuprofen once a week--before golf  Current Outpatient Prescriptions on File Prior to Visit  Medication Sig Dispense Refill  . aspirin 81 MG tablet Take 81 mg by mouth 4 (four) times a week.     . Cholecalciferol (VITAMIN D3 PO) Take 1,000 Units by mouth daily.    Marland Kitchen ibuprofen (ADVIL,MOTRIN) 200 MG tablet Take 600 mg by mouth daily as needed.    . latanoprost (XALATAN) 0.005 % ophthalmic solution Place 1 drop into both eyes at bedtime.    . methylphenidate (RITALIN) 5 MG tablet Take 1 tablet (5 mg total) by mouth 2 (two) times daily. 180 tablet 0  . Multiple Vitamins-Minerals (PRESERVISION AREDS 2 PO) Take 2 tablets by mouth daily.     . pravastatin (PRAVACHOL) 20 MG tablet TAKE 1 TABLET DAILY 90 tablet 3  . sertraline (ZOLOFT) 50 MG tablet Take 1 tablet (50 mg total) by mouth daily. 90 tablet 3  . omeprazole (PRILOSEC) 20 MG capsule Take 1 capsule (20 mg total) by mouth daily. (Patient not taking: Reported on 05/30/2017) 90 capsule 3   No current facility-administered medications on file prior to visit.     Allergies  Allergen Reactions  . Naphazoline-Polyethyl Glycol Other (See Comments)    (Afgan) redness    Past Medical History:  Diagnosis Date  . Anxiety   . Cancer [facility]) 2010   Prostate Cancer  . Cataract    left eye  . Chronic venous insufficiency   . Clotting disorder ([place]) 2013   blood clot 3 days post knee surgery  . Diverticulosis of colon   . Glaucoma   . HLD (hyperlipidemia)   . Hx of colonic  polyp   . Macular degeneration    legally blind in right eye  . OA (osteoarthritis)   . Personal history of prostate cancer   . Pulmonary embolism ([place]) 11/13   post op TKR  . Spinal stenosis of lumbar region     Past Surgical History:  Procedure Laterality Date  . APPENDECTOMY    . COLONOSCOPY  2011  . INGUINAL HERNIA REPAIR     left  . INGUINAL HERNIA REPAIR  5/12   Dr Priscille Heidelberg  . INGUINAL HERNIA REPAIR  5/12   Dr Jamal Collin did redo of this  . JOINT REPLACEMENT  11/13   Left total knee--Dr Hooten  . KNEE SURGERY  2013  . POLYPECTOMY  2011  . PROSTATECTOMY  2010  . TONSILLECTOMY    . VARICOSE VEIN SURGERY     left    Family History  Problem Relation Age of Onset  . Stroke Father   . Dementia Mother   . Leukemia Sister   . Colon cancer Neg Hx     Social History   Social History  . Marital status: Married    Spouse name: N/A  . Number of children: 0  . Years of education: N/A   Occupational History  . Retired-purchasing for [employer]    Social History Main Topics  . Smoking status: Former  Smoker    Types: Cigarettes    Quit date: 12/03/1978  . Smokeless tobacco: Never Used  . Alcohol use 4.2 oz/week    7 Glasses of wine per week  . Drug use: No  . Sexual activity: Not on file   Other Topics Concern  . Not on file   Social History Narrative   Has living will   DNR done 11/12--- he doesn't have it on display though (since he is not sure about this)   Wife is health care POA--then niece Evangeline Dakin    No feeding tube if cognitively unaware   Review of Systems Appetite is good Weight is stable No N/V No fever No blood in stool Some mild headache recently--unusual for him No sig cough or SOB    Objective:   Physical Exam  Constitutional: He appears well-nourished. No distress.  Neck: No thyromegaly present.  Cardiovascular: Normal rate, regular rhythm and normal heart sounds.  Exam reveals no gallop.   No murmur  heard. Pulmonary/Chest: Effort normal and breath sounds normal. No respiratory distress. He has no wheezes. He has no rales.  Lymphadenopathy:    He has no cervical adenopathy.  Psychiatric: He has a normal mood and affect.          Assessment & Plan:

## 2017-05-30 NOTE — Assessment & Plan Note (Signed)
Vague but fairly persistent over 2-3 weeks No worrisome symptoms like fever, N/V, weight loss or blood in stools Doubt diverticular disease or pancreatitis (though points lower for pain area then the tender area was) This could be atypical gallbladder disease I would favor a gastritis (and not overt ulcer)--possibly due to occasional ibuprofen use Discussed plan alternatives Will try empiric omeprazole daily for now. If symptoms go away, he will continue at least every other day indefinitely (if he continues the weekly ibuprofen) If ongoing symptoms will check abd ultrasound or CT (if worry symptoms occur)

## 2017-06-16 ENCOUNTER — Other Ambulatory Visit: Payer: Self-pay | Admitting: Internal Medicine

## 2017-06-17 MED ORDER — METHYLPHENIDATE HCL 5 MG PO TABS: 5.0000 mg | ORAL_TABLET | Freq: Two times a day (BID) | ORAL | 0 refills | Status: DC

## 2017-06-17 NOTE — Telephone Encounter (Signed)
Rx printed and waiting for signature. I sent pt a MyChart message that he is to continue the medication and the rx is will be up front tomorrow to pickup and mail to his mail order

## 2017-06-17 NOTE — Telephone Encounter (Signed)
Rx signed and placed up front.

## 2017-06-17 NOTE — Telephone Encounter (Signed)
I would like him to continue for at least another 3 months Okay to prepare 3 month Rx if there is a way to fax or mail it away

## 2017-06-17 NOTE — Telephone Encounter (Signed)
MyChart message from pt states: I have approx. 2-3 weeks of this medication. Hoping that you do not see the need to keep me on this but if you do can it be sent to [facility] or do I need to pick it up for [facility]?   CPE 04-12-17 Next OV 08-13-17

## 2017-06-24 DIAGNOSIS — L739 Follicular disorder, unspecified: Secondary | ICD-10-CM | POA: Diagnosis not present

## 2017-06-24 DIAGNOSIS — T07XXXA Unspecified multiple injuries, initial encounter: Secondary | ICD-10-CM | POA: Diagnosis not present

## 2017-06-24 DIAGNOSIS — Z1283 Encounter for screening for malignant neoplasm of skin: Secondary | ICD-10-CM | POA: Diagnosis not present

## 2017-06-24 DIAGNOSIS — D1800 Hemangioma unspecified site: Secondary | ICD-10-CM | POA: Diagnosis not present

## 2017-06-24 DIAGNOSIS — D692 Other nonthrombocytopenic purpura: Secondary | ICD-10-CM | POA: Diagnosis not present

## 2017-06-24 DIAGNOSIS — L821 Other seborrheic keratosis: Secondary | ICD-10-CM | POA: Diagnosis not present

## 2017-06-24 DIAGNOSIS — L570 Actinic keratosis: Secondary | ICD-10-CM | POA: Diagnosis not present

## 2017-07-02 ENCOUNTER — Ambulatory Visit (INDEPENDENT_AMBULATORY_CARE_PROVIDER_SITE_OTHER): Payer: Medicare Other | Admitting: Internal Medicine

## 2017-07-02 ENCOUNTER — Encounter: Payer: Self-pay | Admitting: Internal Medicine

## 2017-07-02 VITALS — BP 122/66 | HR 61 | Temp 97.4°F | Wt 182.0 lb

## 2017-07-02 DIAGNOSIS — R519 Headache, unspecified: Secondary | ICD-10-CM | POA: Insufficient documentation

## 2017-07-02 DIAGNOSIS — G4489 Other headache syndrome: Secondary | ICD-10-CM | POA: Diagnosis not present

## 2017-07-02 DIAGNOSIS — R51 Headache: Secondary | ICD-10-CM

## 2017-07-02 NOTE — Assessment & Plan Note (Signed)
Unusual frontal headache with cough or straining Suggests muscular etiology but I wonder about the methylphenidate Reassured since neuro exam normal Will try to wean off the methylphenidate--just in case--since depression still in remission

## 2017-07-02 NOTE — Patient Instructions (Signed)
Please cut the ritalin (methylphenidate) in half and take 2.5mg  twice a day for 2 weeks. If no change in your mood at that point, then stop it.

## 2017-07-02 NOTE — Progress Notes (Signed)
Subjective:    Patient ID: Juan Horn, male    DOB: 03-16-1933, 83 y.o.   MRN: [id number]  HPI Here due to headache Never had headaches in past Now will have some slight cough--and then gets frontal headache Also gets headache if he has to strain moving his bowels Pain is very temporary---goes away when he stops straining or coughing More in afternoon or evening  Mood has been excellent No depression On the sertraline--and the ritalin bid  Current Outpatient Prescriptions on File Prior to Visit  Medication Sig Dispense Refill  . aspirin 81 MG tablet Take 81 mg by mouth 4 (four) times a week.     . Cholecalciferol (VITAMIN D3 PO) Take 1,000 Units by mouth daily.    Marland Kitchen ibuprofen (ADVIL,MOTRIN) 200 MG tablet Take 600 mg by mouth daily as needed.    . latanoprost (XALATAN) 0.005 % ophthalmic solution Place 1 drop into both eyes at bedtime.    . methylphenidate (RITALIN) 5 MG tablet Take 1 tablet (5 mg total) by mouth 2 (two) times daily. 180 tablet 0  . Multiple Vitamins-Minerals (PRESERVISION AREDS 2 PO) Take 2 tablets by mouth daily.     Marland Kitchen omeprazole (PRILOSEC) 20 MG capsule Take 1 capsule (20 mg total) by mouth daily. 90 capsule 3  . pravastatin (PRAVACHOL) 20 MG tablet TAKE 1 TABLET DAILY 90 tablet 3  . sertraline (ZOLOFT) 50 MG tablet Take 1 tablet (50 mg total) by mouth daily. 90 tablet 3   No current facility-administered medications on file prior to visit.     Allergies  Allergen Reactions  . Naphazoline-Polyethyl Glycol Other (See Comments)    (Afgan) redness    Past Medical History:  Diagnosis Date  . Anxiety   . Cancer [facility]) 2010   Prostate Cancer  . Cataract    left eye  . Chronic venous insufficiency   . Clotting disorder ([place]) 2013   blood clot 3 days post knee surgery  . Diverticulosis of colon   . Glaucoma   . HLD (hyperlipidemia)   . Hx of colonic polyp   . Macular degeneration    legally blind in right eye  . OA (osteoarthritis)   .  Personal history of prostate cancer   . Pulmonary embolism ([place]) 11/13   post op TKR  . Spinal stenosis of lumbar region     Past Surgical History:  Procedure Laterality Date  . APPENDECTOMY    . COLONOSCOPY  2011  . INGUINAL HERNIA REPAIR     left  . INGUINAL HERNIA REPAIR  5/12   Dr Priscille Heidelberg  . INGUINAL HERNIA REPAIR  5/12   Dr Jamal Collin did redo of this  . JOINT REPLACEMENT  11/13   Left total knee--Dr Hooten  . KNEE SURGERY  2013  . POLYPECTOMY  2011  . PROSTATECTOMY  2010  . TONSILLECTOMY    . VARICOSE VEIN SURGERY     left    Family History  Problem Relation Age of Onset  . Stroke Father   . Dementia Mother   . Leukemia Sister   . Colon cancer Neg Hx     Social History   Social History  . Marital status: Married    Spouse name: N/A  . Number of children: 0  . Years of education: N/A   Occupational History  . Retired-purchasing for [employer]    Social History Main Topics  . Smoking status: Former Smoker    Types: Cigarettes  Quit date: 12/03/1978  . Smokeless tobacco: Never Used  . Alcohol use 4.2 oz/week    7 Glasses of wine per week  . Drug use: No  . Sexual activity: Not on file   Other Topics Concern  . Not on file   Social History Narrative   Has living will   DNR done 11/12--- he doesn't have it on display though (since he is not sure about this)   Wife is health care POA--then niece Evangeline Dakin    No feeding tube if cognitively unaware   Review of Systems Stomach symptoms have resolved No dizziness No vision changes other than blurry due to cataract No temporal pain    Objective:   Physical Exam  Constitutional: He is oriented to person, place, and time.  Eyes: Pupils are equal, round, and reactive to light. Conjunctivae and EOM are normal.  No sig nystagmus  Neck: No thyromegaly present.  Lymphadenopathy:    He has no cervical adenopathy.  Neurological: He is oriented to person, place, and time. He has normal strength.  No cranial nerve deficit. He exhibits normal muscle tone. He displays a negative Romberg sign. Coordination and gait normal.          Assessment & Plan:

## 2017-07-15 DIAGNOSIS — J019 Acute sinusitis, unspecified: Secondary | ICD-10-CM | POA: Diagnosis not present

## 2017-07-15 DIAGNOSIS — R05 Cough: Secondary | ICD-10-CM | POA: Diagnosis not present

## 2017-07-16 DIAGNOSIS — H401134 Primary open-angle glaucoma, bilateral, indeterminate stage: Secondary | ICD-10-CM | POA: Diagnosis not present

## 2017-07-22 DIAGNOSIS — H401134 Primary open-angle glaucoma, bilateral, indeterminate stage: Secondary | ICD-10-CM | POA: Diagnosis not present

## 2017-07-23 ENCOUNTER — Ambulatory Visit (INDEPENDENT_AMBULATORY_CARE_PROVIDER_SITE_OTHER): Payer: Medicare Other | Admitting: Family Medicine

## 2017-07-23 ENCOUNTER — Encounter: Payer: Self-pay | Admitting: Family Medicine

## 2017-07-23 DIAGNOSIS — R05 Cough: Secondary | ICD-10-CM | POA: Diagnosis not present

## 2017-07-23 DIAGNOSIS — R059 Cough, unspecified: Secondary | ICD-10-CM | POA: Insufficient documentation

## 2017-07-23 NOTE — Progress Notes (Signed)
Subjective:   Patient ID: Juan Horn, male    DOB: September 26, 1933, 83 y.o.   MRN: [id number]  Juan Horn is a pleasant 83 y.o. year old male who presents to clinic today with Cough  on 07/23/2017  HPI:  Patient of Dr. Silvio Pate, new to me.  Was seen by Jefm Bryant walk in clinic on 07/15/17 for sinusitis. Note reviewed. Placed on zpack, Tessalon, codeine- guaifenesin as needed.  Feels much better but cough is lingering.  Not productive but keeping his wife up at night.    Current Outpatient Prescriptions on File Prior to Visit  Medication Sig Dispense Refill  . aspirin 81 MG tablet Take 81 mg by mouth 4 (four) times a week.     . Cholecalciferol (VITAMIN D3 PO) Take 1,000 Units by mouth daily.    Marland Kitchen ibuprofen (ADVIL,MOTRIN) 200 MG tablet Take 600 mg by mouth daily as needed.    . latanoprost (XALATAN) 0.005 % ophthalmic solution Place 1 drop into both eyes at bedtime.    . Multiple Vitamins-Minerals (PRESERVISION AREDS 2 PO) Take 2 tablets by mouth daily.     Marland Kitchen omeprazole (PRILOSEC) 20 MG capsule Take 1 capsule (20 mg total) by mouth daily. 90 capsule 3  . pravastatin (PRAVACHOL) 20 MG tablet TAKE 1 TABLET DAILY 90 tablet 3  . sertraline (ZOLOFT) 50 MG tablet Take 1 tablet (50 mg total) by mouth daily. 90 tablet 3  . methylphenidate (RITALIN) 5 MG tablet Take 1 tablet (5 mg total) by mouth 2 (two) times daily. (Patient not taking: Reported on 07/23/2017) 180 tablet 0   No current facility-administered medications on file prior to visit.     Allergies  Allergen Reactions  . Naphazoline-Polyethyl Glycol Other (See Comments)    (Afgan) redness    Past Medical History:  Diagnosis Date  . Anxiety   . Cancer [facility]) 2010   Prostate Cancer  . Cataract    left eye  . Chronic venous insufficiency   . Clotting disorder ([place]) 2013   blood clot 3 days post knee surgery  . Diverticulosis of colon   . Glaucoma   . HLD (hyperlipidemia)   . Hx of colonic polyp   .  Macular degeneration    legally blind in right eye  . OA (osteoarthritis)   . Personal history of prostate cancer   . Pulmonary embolism ([place]) 11/13   post op TKR  . Spinal stenosis of lumbar region     Past Surgical History:  Procedure Laterality Date  . APPENDECTOMY    . COLONOSCOPY  2011  . INGUINAL HERNIA REPAIR     left  . INGUINAL HERNIA REPAIR  5/12   Dr Priscille Heidelberg  . INGUINAL HERNIA REPAIR  5/12   Dr Jamal Collin did redo of this  . JOINT REPLACEMENT  11/13   Left total knee--Dr Hooten  . KNEE SURGERY  2013  . POLYPECTOMY  2011  . PROSTATECTOMY  2010  . TONSILLECTOMY    . VARICOSE VEIN SURGERY     left    Family History  Problem Relation Age of Onset  . Stroke Father   . Dementia Mother   . Leukemia Sister   . Colon cancer Neg Hx     Social History   Social History  . Marital status: Married    Spouse name: N/A  . Number of children: 0  . Years of education: N/A   Occupational History  . Retired-purchasing for [employer]  Social History Main Topics  . Smoking status: Former Smoker    Types: Cigarettes    Quit date: 12/03/1978  . Smokeless tobacco: Never Used  . Alcohol use 4.2 oz/week    7 Glasses of wine per week  . Drug use: No  . Sexual activity: Not on file   Other Topics Concern  . Not on file   Social History Narrative   Has living will   DNR done 11/12--- he doesn't have it on display though (since he is not sure about this)   Wife is health care POA--then niece Evangeline Dakin    No feeding tube if cognitively unaware   The PMH, PSH, Social History, Family History, Medications, and allergies have been reviewed in [facility], and have been updated if relevant.   Review of Systems  Constitutional: Negative.   HENT: Negative.   Respiratory: Positive for cough. Negative for shortness of breath and stridor.   Cardiovascular: Negative.   Musculoskeletal: Negative.   Hematological: Negative.   All other systems reviewed and are  negative.      Objective:    BP 110/64 (BP Location: Left Arm, Patient Position: Sitting, Cuff Size: Normal)   Pulse 74   Temp 97.7 F (36.5 C) (Oral)   Wt 182 lb (82.6 kg)   SpO2 96%   BMI 28.72 kg/m    Physical Exam  General:  pleasant male in no acute distress Eyes:  PERRL Ears:  External ear exam shows no significant lesions or deformities.  TMs normal bilaterally Hearing is grossly normal bilaterally. Nose:  External nasal examination shows no deformity or inflammation. Nasal mucosa are pink and moist without lesions or exudates. Mouth:  Oral mucosa and oropharynx without lesions or exudates.  Teeth in good repair. Neck:  no carotid bruit or thyromegaly no cervical or supraclavicular lymphadenopathy  Lungs:  Normal respiratory effort, chest expands symmetrically. Lungs are clear to auscultation, no crackles or wheezes. Heart:  Normal rate and regular rhythm. S1 and S2 normal without gallop, murmur, click, rub or other extra sounds. Pulses:  R and L posterior tibial pulses are full and equal bilaterally  Extremities:  no edema  Psych:  Good eye contact, not anxious or depressed appearing       Assessment & Plan:   Cough No Follow-up on file.

## 2017-07-23 NOTE — Assessment & Plan Note (Signed)
Lung exam reassuring. Explained to patient that cough can linger for weeks. Ok to continue as needed cough suppressant. He declined a refill. Call or return to clinic prn if these symptoms worsen or fail to improve as anticipated. The patient indicates understanding of these issues and agrees with the plan.

## 2017-07-23 NOTE — Patient Instructions (Signed)
Great to see you.  Your lungs sound great.  Tell your wife that the cough can linger.

## 2017-08-06 DIAGNOSIS — H6981 Other specified disorders of Eustachian tube, right ear: Secondary | ICD-10-CM | POA: Diagnosis not present

## 2017-08-06 DIAGNOSIS — H6501 Acute serous otitis media, right ear: Secondary | ICD-10-CM | POA: Diagnosis not present

## 2017-08-13 ENCOUNTER — Encounter: Payer: Self-pay | Admitting: Internal Medicine

## 2017-08-13 ENCOUNTER — Ambulatory Visit (INDEPENDENT_AMBULATORY_CARE_PROVIDER_SITE_OTHER): Payer: Medicare Other | Admitting: Internal Medicine

## 2017-08-13 VITALS — BP 114/68 | HR 57 | Temp 97.8°F | Wt 179.8 lb

## 2017-08-13 DIAGNOSIS — F321 Major depressive disorder, single episode, moderate: Secondary | ICD-10-CM

## 2017-08-13 DIAGNOSIS — H9193 Unspecified hearing loss, bilateral: Secondary | ICD-10-CM | POA: Diagnosis not present

## 2017-08-13 DIAGNOSIS — Z23 Encounter for immunization: Secondary | ICD-10-CM

## 2017-08-13 DIAGNOSIS — H9190 Unspecified hearing loss, unspecified ear: Secondary | ICD-10-CM | POA: Insufficient documentation

## 2017-08-13 NOTE — Assessment & Plan Note (Signed)
Now seems to be in remission Since he has had past problems, will continue the sertraline (probably indefinitely) First episode was in 1970's (nearly had breakdown)

## 2017-08-13 NOTE — Assessment & Plan Note (Signed)
ENT thinks it is from fluid in middle ears Will continue the fluticasone for now

## 2017-08-13 NOTE — Progress Notes (Signed)
Subjective:    Patient ID: Juan Horn, male    DOB: 05-23-1933, 83 y.o.   MRN: [id number]  HPI Here for follow up of depression  Having trouble with hearing Saw ENT--fluid in ears Using nasal spray but not really better yet  Headaches are gone Not sure if it was the methylphenidate but it is gone  Abdominal pain is gone Used the prilosec for 2 weeks---then stopped No recurrence  Mood has been good Feels he is happy, enjoys life  Current Outpatient Prescriptions on File Prior to Visit  Medication Sig Dispense Refill  . aspirin 81 MG tablet Take 81 mg by mouth 4 (four) times a week.     . Cholecalciferol (VITAMIN D3 PO) Take 1,000 Units by mouth daily.    Marland Kitchen ibuprofen (ADVIL,MOTRIN) 200 MG tablet Take 600 mg by mouth daily as needed.    . latanoprost (XALATAN) 0.005 % ophthalmic solution Place 1 drop into both eyes at bedtime.    . Multiple Vitamins-Minerals (PRESERVISION AREDS 2 PO) Take 2 tablets by mouth daily.     . pravastatin (PRAVACHOL) 20 MG tablet TAKE 1 TABLET DAILY 90 tablet 3  . sertraline (ZOLOFT) 50 MG tablet Take 1 tablet (50 mg total) by mouth daily. 90 tablet 3   No current facility-administered medications on file prior to visit.     Allergies  Allergen Reactions  . Naphazoline-Polyethyl Glycol Other (See Comments)    (Afgan) redness    Past Medical History:  Diagnosis Date  . Anxiety   . Cancer [facility]) 2010   Prostate Cancer  . Cataract    left eye  . Chronic venous insufficiency   . Clotting disorder ([place]) 2013   blood clot 3 days post knee surgery  . Diverticulosis of colon   . Glaucoma   . HLD (hyperlipidemia)   . Hx of colonic polyp   . Macular degeneration    legally blind in right eye  . OA (osteoarthritis)   . Personal history of prostate cancer   . Pulmonary embolism ([place]) 11/13   post op TKR  . Spinal stenosis of lumbar region     Past Surgical History:  Procedure Laterality Date  . APPENDECTOMY    . COLONOSCOPY   2011  . INGUINAL HERNIA REPAIR     left  . INGUINAL HERNIA REPAIR  5/12   Dr Priscille Heidelberg  . INGUINAL HERNIA REPAIR  5/12   Dr Jamal Collin did redo of this  . JOINT REPLACEMENT  11/13   Left total knee--Dr Hooten  . KNEE SURGERY  2013  . POLYPECTOMY  2011  . PROSTATECTOMY  2010  . TONSILLECTOMY    . VARICOSE VEIN SURGERY     left    Family History  Problem Relation Age of Onset  . Stroke Father   . Dementia Mother   . Leukemia Sister   . Colon cancer Neg Hx     Social History   Social History  . Marital status: Married    Spouse name: N/A  . Number of children: 0  . Years of education: N/A   Occupational History  . Retired-purchasing for [employer]    Social History Main Topics  . Smoking status: Former Smoker    Types: Cigarettes    Quit date: 12/03/1978  . Smokeless tobacco: Never Used  . Alcohol use 4.2 oz/week    7 Glasses of wine per week  . Drug use: No  . Sexual activity: Not on file  Other Topics Concern  . Not on file   Social History Narrative   Has living will   DNR done 11/12--- he doesn't have it on display though (since he is not sure about this)   Wife is health care POA--then niece Evangeline Dakin    No feeding tube if cognitively unaware   Review of Systems  Generally sleeps well Appetite is good     Objective:   Physical Exam  HENT:  Some cerumen on right TMs not inflamed--hard to judge fluid in middle ear  Psychiatric: He has a normal mood and affect. His behavior is normal.          Assessment & Plan:

## 2017-09-25 ENCOUNTER — Other Ambulatory Visit: Payer: Self-pay | Admitting: Internal Medicine

## 2017-11-13 DIAGNOSIS — I493 Ventricular premature depolarization: Secondary | ICD-10-CM | POA: Diagnosis not present

## 2017-11-13 DIAGNOSIS — E785 Hyperlipidemia, unspecified: Secondary | ICD-10-CM | POA: Diagnosis not present

## 2017-11-19 DIAGNOSIS — H353122 Nonexudative age-related macular degeneration, left eye, intermediate dry stage: Secondary | ICD-10-CM | POA: Diagnosis not present

## 2018-03-20 DIAGNOSIS — H353122 Nonexudative age-related macular degeneration, left eye, intermediate dry stage: Secondary | ICD-10-CM | POA: Diagnosis not present

## 2018-05-06 ENCOUNTER — Encounter: Payer: Self-pay | Admitting: Internal Medicine

## 2018-05-06 ENCOUNTER — Ambulatory Visit (INDEPENDENT_AMBULATORY_CARE_PROVIDER_SITE_OTHER): Payer: Medicare Other | Admitting: Internal Medicine

## 2018-05-06 VITALS — BP 122/74 | HR 66 | Temp 97.3°F | Ht 66.5 in | Wt 184.0 lb

## 2018-05-06 DIAGNOSIS — Z0000 Encounter for general adult medical examination without abnormal findings: Secondary | ICD-10-CM | POA: Diagnosis not present

## 2018-05-06 DIAGNOSIS — Z23 Encounter for immunization: Secondary | ICD-10-CM

## 2018-05-06 DIAGNOSIS — Z8546 Personal history of malignant neoplasm of prostate: Secondary | ICD-10-CM | POA: Diagnosis not present

## 2018-05-06 DIAGNOSIS — N1831 Chronic kidney disease, stage 3a: Secondary | ICD-10-CM | POA: Insufficient documentation

## 2018-05-06 DIAGNOSIS — N183 Chronic kidney disease, stage 3 unspecified: Secondary | ICD-10-CM

## 2018-05-06 DIAGNOSIS — C44629 Squamous cell carcinoma of skin of left upper limb, including shoulder: Secondary | ICD-10-CM | POA: Diagnosis not present

## 2018-05-06 DIAGNOSIS — Z7189 Other specified counseling: Secondary | ICD-10-CM

## 2018-05-06 DIAGNOSIS — C4492 Squamous cell carcinoma of skin, unspecified: Secondary | ICD-10-CM

## 2018-05-06 DIAGNOSIS — E785 Hyperlipidemia, unspecified: Secondary | ICD-10-CM | POA: Diagnosis not present

## 2018-05-06 DIAGNOSIS — M48062 Spinal stenosis, lumbar region with neurogenic claudication: Secondary | ICD-10-CM

## 2018-05-06 DIAGNOSIS — F321 Major depressive disorder, single episode, moderate: Secondary | ICD-10-CM

## 2018-05-06 DIAGNOSIS — L578 Other skin changes due to chronic exposure to nonionizing radiation: Secondary | ICD-10-CM | POA: Diagnosis not present

## 2018-05-06 HISTORY — DX: Squamous cell carcinoma of skin, unspecified: C44.92

## 2018-05-06 LAB — COMPREHENSIVE METABOLIC PANEL
ALT: 27 U/L (ref 0–53)
AST: 19 U/L (ref 0–37)
Albumin: 4.1 g/dL (ref 3.5–5.2)
Alkaline Phosphatase: 84 U/L (ref 39–117)
BUN: 26 mg/dL — AB (ref 6–23)
CHLORIDE: 102 meq/L (ref 96–112)
CO2: 30 meq/L (ref 19–32)
Calcium: 9.2 mg/dL (ref 8.4–10.5)
Creatinine, Ser: 1.4 mg/dL (ref 0.40–1.50)
GFR: 51.44 mL/min — ABNORMAL LOW (ref >60.00)
GLUCOSE: 107 mg/dL — AB (ref 70–99)
POTASSIUM: 4.1 meq/L (ref 3.5–5.1)
SODIUM: 141 meq/L (ref 135–145)
Total Bilirubin: 0.6 mg/dL (ref 0.2–1.2)
Total Protein: 7 g/dL (ref 6.0–8.3)

## 2018-05-06 LAB — CBC
HEMATOCRIT: 39.6 % (ref 39.0–52.0)
HEMOGLOBIN: 13 g/dL (ref 13.0–17.0)
MCHC: 32.8 g/dL (ref 30.0–36.0)
MCV: 81.5 fl (ref 78.0–100.0)
Platelets: 195 10*3/uL (ref 150.0–400.0)
RBC: 4.86 Mil/uL (ref 4.22–5.81)
RDW: 19.5 % — AB (ref 11.5–15.5)
WBC: 7 10*3/uL (ref 4.0–10.5)

## 2018-05-06 LAB — LIPID PANEL
CHOL/HDL RATIO: 4
Cholesterol: 190 mg/dL (ref 0–200)
HDL: 42.3 mg/dL (ref >39.00)

## 2018-05-06 LAB — T4, FREE: FREE T4: 0.87 ng/dL (ref 0.60–1.60)

## 2018-05-06 LAB — PSA: PSA: 0.01 ng/mL — ABNORMAL LOW (ref 0.10–4.00)

## 2018-05-06 LAB — LDL CHOLESTEROL, DIRECT: Direct LDL: 112 mg/dL

## 2018-05-06 NOTE — Assessment & Plan Note (Signed)
About 10 years ago Discussed---will check PSA again

## 2018-05-06 NOTE — Assessment & Plan Note (Signed)
Has DNR but ambivalent about it and doesn't display it

## 2018-05-06 NOTE — Progress Notes (Signed)
Hearing Screening (Inadequate exam)   Method: Audiometry   125Hz  250Hz  500Hz  1000Hz  2000Hz  3000Hz  4000Hz  6000Hz  8000Hz   Right ear:   0 40 40  0    Left ear:   0 0 0  0    Vision Screening Comments: April 2019

## 2018-05-06 NOTE — Assessment & Plan Note (Signed)
I have personally reviewed the Medicare Annual Wellness questionnaire and have noted 1. The patient's medical and social history 2. Their use of alcohol, tobacco or illicit drugs 3. Their current medications and supplements 4. The patient's functional ability including ADL's, fall risks, home safety risks and hearing or visual             impairment. 5. Diet and physical activities 6. Evidence for depression or mood disorders  The patients weight, height, BMI and visual acuity have been recorded in the chart I have made referrals, counseling and provided education to the patient based review of the above and I have provided the pt with a written personalized care plan for preventive services.  I have provided you with a copy of your personalized plan for preventive services. Please take the time to review along with your updated medication list.  Yearly flu vaccine Pneumovax booster today shingrix when available No cancer screening due to age

## 2018-05-06 NOTE — Assessment & Plan Note (Signed)
In remission on sertraline Will continue indefinitely

## 2018-05-06 NOTE — Addendum Note (Signed)
Addended by: Pilar Grammes on: 05/06/2018 11:53 AM   Modules accepted: Orders

## 2018-05-06 NOTE — Progress Notes (Signed)
Subjective:    Patient ID: Juan Horn, male    DOB: [birth year], 84 y.o.   MRN: [id number]  HPI Here for Medicare wellness visit and follow up of chronic health conditions Reviewed form and advanced directives Reviewed other doctors Tries to exercise regularly on the Nu-step. Discussed adding resistance training Enjoys daily wine--- 1-2 glasses a day No tobacco Vision not great---some issues with cataract Hearing not great---didn't realize he couldn't hear out of left ear No falls Mood has been good Independent with instrumental ADLs Memory has been okay  Has new nodule on left forearm by elbow Not inflamed May be slow growing BSC---he will set up with Dr Nicole Kindred  Depression remains in remission Continues on the sertraline  Still sees Dr Saralyn Pilar Had symptomatic bradycardia Has had some dizziness recently--- BP only 100/60 Not sure about the heart rate No chest pain or SOB No palpitations No edema  No recent visit with Dr Sharlet Salina Injection in back not helpful Still limited in walking---has to stop regularly Has knee pain at times--uses ibuprofen prn (if a lot of walking planned)  Labs show mildly reduced GFR He has no symptoms  Current Outpatient Medications on File Prior to Visit  Medication Sig Dispense Refill  . aspirin 81 MG tablet Take 81 mg by mouth 4 (four) times a week.     . Cholecalciferol (VITAMIN D3 PO) Take 1,000 Units by mouth daily.    Marland Kitchen ibuprofen (ADVIL,MOTRIN) 200 MG tablet Take 600 mg by mouth daily as needed.    . latanoprost (XALATAN) 0.005 % ophthalmic solution Place 1 drop into both eyes at bedtime.    . Multiple Vitamins-Minerals (PRESERVISION AREDS 2 PO) Take 2 tablets by mouth daily.     . pravastatin (PRAVACHOL) 20 MG tablet TAKE 1 TABLET DAILY 90 tablet 3  . sertraline (ZOLOFT) 50 MG tablet Take 1 tablet (50 mg total) by mouth daily. 90 tablet 3   No current facility-administered medications on file prior to visit.      Allergies  Allergen Reactions  . Naphazoline-Polyethyl Glycol Other (See Comments)    (Afgan) redness    Past Medical History:  Diagnosis Date  . Anxiety   . Cancer [facility]) 2010   Prostate Cancer  . Cataract    left eye  . Chronic venous insufficiency   . Clotting disorder ([place]) 2013   blood clot 3 days post knee surgery  . Diverticulosis of colon   . Glaucoma   . HLD (hyperlipidemia)   . Hx of colonic polyp   . Macular degeneration    legally blind in right eye  . OA (osteoarthritis)   . Personal history of prostate cancer   . Pulmonary embolism ([place]) 11/13   post op TKR  . Spinal stenosis of lumbar region     Past Surgical History:  Procedure Laterality Date  . APPENDECTOMY    . COLONOSCOPY  2011  . INGUINAL HERNIA REPAIR     left  . INGUINAL HERNIA REPAIR  5/12   Dr Priscille Heidelberg  . INGUINAL HERNIA REPAIR  5/12   Dr Jamal Collin did redo of this  . JOINT REPLACEMENT  11/13   Left total knee--Dr Hooten  . KNEE SURGERY  2013  . POLYPECTOMY  2011  . PROSTATECTOMY  2010  . TONSILLECTOMY    . VARICOSE VEIN SURGERY     left    Family History  Problem Relation Age of Onset  . Stroke Father   . Dementia Mother   .  Leukemia Sister   . Colon cancer Neg Hx     Social History   Socioeconomic History  . Marital status: Married    Spouse name: Not on file  . Number of children: 0  . Years of education: Not on file  . Highest education level: Not on file  Occupational History  . 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 Financial resource strain: Not on file  . Food insecurity:    Worry: Not on file    Inability: Not on file  . Transportation needs:    Medical: Not on file    Non-medical: Not on file  Tobacco Use  . Smoking status: Former Smoker    Types: Cigarettes    Last attempt to quit: 12/03/1978    Years since quitting: 39.4  . Smokeless tobacco: Never Used  Substance and Sexual Activity  . Alcohol use: Yes    Alcohol/week:  4.2 oz    Types: 7 Glasses of wine per week  . Drug use: No  . Sexual activity: Not on file  Lifestyle  . Physical activity:    Days per week: Not on file    Minutes per session: Not on file  . Stress: Not on file  Relationships  . Social connections:    Talks on phone: Not on file    Gets together: Not on file    Attends religious service: Not on file    Active member of club or organization: Not on file    Attends meetings of clubs or organizations: Not on file    Relationship status: Not on file  . Intimate partner violence:    Fear of current or ex partner: Not on file    Emotionally abused: Not on file    Physically abused: Not on file    Forced sexual activity: Not on file  Other Topics Concern  . Not on file  Social History Narrative   Has living will   DNR done 11/12--- he doesn't have it on display though (since he is not sure about this)   Wife is health care POA--then niece Evangeline Dakin    No feeding tube if cognitively unaware   Review of Systems Appetite is good Weight is up a bit Sleeps well Wears seat belt Teeth are fine. Regular with the dentist No headaches No heartburn or dysphagia. Occasionally coughs when eating or at other times (like lying down) Bowels are fine. No blood Voids okay----some leakage at times (but no pad) Continues on statin without any difficulties    Objective:   Physical Exam  Constitutional: He is oriented to person, place, and time. He appears well-developed. No distress.  HENT:  Mouth/Throat: Oropharynx is clear and moist. No oropharyngeal exudate.  Neck: No thyromegaly present.  Cardiovascular: Normal rate, regular rhythm, normal heart sounds and intact distal pulses.  No murmur heard. Respiratory: Effort normal and breath sounds normal. No respiratory distress. He has no wheezes. He has no rales.  GI: Soft. There is no tenderness.  Musculoskeletal: He exhibits no edema.  Varicosities left calf to knee   Lymphadenopathy:    He has no cervical adenopathy.  Neurological: He is alert and oriented to person, place, and time.  President--- "Dwaine Deter, Bush" [phone] [id number] Recall 3/3  Skin: No rash noted.  Well circumscribed lesion left forearm by elbow (will go to derm)  Psychiatric: He has a normal mood and affect. His behavior is normal.  Assessment & Plan:

## 2018-05-06 NOTE — Assessment & Plan Note (Signed)
>>  ASSESSMENT AND PLAN FOR STAGE 3A CHRONIC KIDNEY DISEASE (HCC) WRITTEN ON 05/06/2018 11:15 AM BY Verdell Dykman I, MD  Mild and BP is good No Rx unless progression

## 2018-05-06 NOTE — Assessment & Plan Note (Signed)
Chronic stable claudication Ibuprofen some help--discussed limiting this

## 2018-05-06 NOTE — Assessment & Plan Note (Signed)
Okay with primary prevention still

## 2018-05-06 NOTE — Assessment & Plan Note (Signed)
Mild and BP is good No Rx unless progression

## 2018-05-07 ENCOUNTER — Encounter: Payer: Self-pay | Admitting: Internal Medicine

## 2018-05-07 DIAGNOSIS — N183 Chronic kidney disease, stage 3 unspecified: Secondary | ICD-10-CM | POA: Insufficient documentation

## 2018-05-14 DIAGNOSIS — I493 Ventricular premature depolarization: Secondary | ICD-10-CM | POA: Diagnosis not present

## 2018-05-14 DIAGNOSIS — Z86718 Personal history of other venous thrombosis and embolism: Secondary | ICD-10-CM | POA: Diagnosis not present

## 2018-05-14 DIAGNOSIS — R001 Bradycardia, unspecified: Secondary | ICD-10-CM | POA: Diagnosis not present

## 2018-05-14 DIAGNOSIS — E785 Hyperlipidemia, unspecified: Secondary | ICD-10-CM | POA: Diagnosis not present

## 2018-05-14 DIAGNOSIS — I872 Venous insufficiency (chronic) (peripheral): Secondary | ICD-10-CM | POA: Diagnosis not present

## 2018-05-29 DIAGNOSIS — H903 Sensorineural hearing loss, bilateral: Secondary | ICD-10-CM | POA: Diagnosis not present

## 2018-06-02 ENCOUNTER — Other Ambulatory Visit: Payer: Self-pay | Admitting: Internal Medicine

## 2018-06-23 DIAGNOSIS — L820 Inflamed seborrheic keratosis: Secondary | ICD-10-CM | POA: Diagnosis not present

## 2018-06-23 DIAGNOSIS — L853 Xerosis cutis: Secondary | ICD-10-CM | POA: Diagnosis not present

## 2018-06-23 DIAGNOSIS — L219 Seborrheic dermatitis, unspecified: Secondary | ICD-10-CM | POA: Diagnosis not present

## 2018-06-23 DIAGNOSIS — L821 Other seborrheic keratosis: Secondary | ICD-10-CM | POA: Diagnosis not present

## 2018-06-23 DIAGNOSIS — Z85828 Personal history of other malignant neoplasm of skin: Secondary | ICD-10-CM | POA: Diagnosis not present

## 2018-06-23 DIAGNOSIS — L739 Follicular disorder, unspecified: Secondary | ICD-10-CM | POA: Diagnosis not present

## 2018-06-23 DIAGNOSIS — L570 Actinic keratosis: Secondary | ICD-10-CM | POA: Diagnosis not present

## 2018-06-23 DIAGNOSIS — D1801 Hemangioma of skin and subcutaneous tissue: Secondary | ICD-10-CM | POA: Diagnosis not present

## 2018-06-23 DIAGNOSIS — L578 Other skin changes due to chronic exposure to nonionizing radiation: Secondary | ICD-10-CM | POA: Diagnosis not present

## 2018-06-23 DIAGNOSIS — Z1283 Encounter for screening for malignant neoplasm of skin: Secondary | ICD-10-CM | POA: Diagnosis not present

## 2018-07-21 DIAGNOSIS — H401134 Primary open-angle glaucoma, bilateral, indeterminate stage: Secondary | ICD-10-CM | POA: Diagnosis not present

## 2018-07-28 DIAGNOSIS — H353212 Exudative age-related macular degeneration, right eye, with inactive choroidal neovascularization: Secondary | ICD-10-CM | POA: Diagnosis not present

## 2018-09-18 DIAGNOSIS — Z23 Encounter for immunization: Secondary | ICD-10-CM | POA: Diagnosis not present

## 2018-09-21 ENCOUNTER — Other Ambulatory Visit: Payer: Self-pay | Admitting: Internal Medicine

## 2018-10-15 ENCOUNTER — Encounter: Payer: Self-pay | Admitting: Primary Care

## 2018-10-15 ENCOUNTER — Ambulatory Visit (INDEPENDENT_AMBULATORY_CARE_PROVIDER_SITE_OTHER): Payer: Medicare Other | Admitting: Primary Care

## 2018-10-15 VITALS — BP 120/64 | HR 70 | Temp 97.8°F | Ht 66.5 in | Wt 186.5 lb

## 2018-10-15 DIAGNOSIS — K219 Gastro-esophageal reflux disease without esophagitis: Secondary | ICD-10-CM | POA: Diagnosis not present

## 2018-10-15 MED ORDER — FAMOTIDINE 40 MG PO TABS: 40.0000 mg | ORAL_TABLET | Freq: Every day | ORAL | 0 refills | Status: DC

## 2018-10-15 NOTE — Assessment & Plan Note (Signed)
Intermittent history. HPI and exam today consistent for GERD. Will trial famotidine 40 mg daily for 2-3 weeks, he will update if symptoms persist.   No suspicion for ACS or other abdominal etiology.

## 2018-10-15 NOTE — Progress Notes (Signed)
Subjective:    Patient ID: Juan Horn, male    DOB: May 07, 1933, 84 y.o.   MRN: [id number]  HPI  Juan Horn is an 84 year old male with a history of diverticulosis,GERD,CKD stage III, hyperlipidemia who presents today with a chief complaint of esophageal reflux.  Symptoms include epigastric discomfort with burning sensation. He has a history of this in the past which will typically dissipate after taking Tums. These symptoms feel very similar to his prior GERD.   His recent symptoms began three days ago and occurs with and without meals. He's been taking Tums without much improvement. He will drink some water due to the "burning" which will temporarily help.   He avoids laying down within 2 hours after meals. He denies nausea, vomiting, chest pain, diarrhea, constipation. He's not taken anything else for symptoms.   Review of Systems  Respiratory: Negative for shortness of breath.   Cardiovascular: Negative for chest pain.  Gastrointestinal: Negative for constipation, diarrhea, nausea and vomiting.       Epigastric burning       Past Medical History:  Diagnosis Date  . Anxiety   . Cancer [facility]) 2010   Prostate Cancer  . Cataract    left eye  . Chronic renal disease, stage III ([place])   . Chronic venous insufficiency   . Clotting disorder ([place]) 2013   blood clot 3 days post knee surgery  . Diverticulosis of colon   . Glaucoma   . HLD (hyperlipidemia)   . Hx of colonic polyp   . Macular degeneration    legally blind in right eye  . OA (osteoarthritis)   . Personal history of prostate cancer   . Pulmonary embolism ([place]) 11/13   post op TKR  . Spinal stenosis of lumbar region      Social History   Socioeconomic History  . Marital status: Married    Spouse name: Not on file  . Number of children: 0  . Years of education: Not on file  . Highest education level: Not on file  Occupational History  . 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 Financial resource strain: Not on file  . Food insecurity:    Worry: Not on file    Inability: Not on file  . Transportation needs:    Medical: Not on file    Non-medical: Not on file  Tobacco Use  . Smoking status: Former Smoker    Types: Cigarettes    Last attempt to quit: 12/03/1978    Years since quitting: 39.8  . Smokeless tobacco: Never Used  Substance and Sexual Activity  . Alcohol use: Yes    Alcohol/week: 7.0 standard drinks    Types: 7 Glasses of wine per week  . Drug use: No  . Sexual activity: Not on file  Lifestyle  . Physical activity:    Days per week: Not on file    Minutes per session: Not on file  . Stress: Not on file  Relationships  . Social connections:    Talks on phone: Not on file    Gets together: Not on file    Attends religious service: Not on file    Active member of club or organization: Not on file    Attends meetings of clubs or organizations: Not on file    Relationship status: Not on file  . Intimate partner violence:    Fear of current or ex partner: Not on file  Emotionally abused: Not on file    Physically abused: Not on file    Forced sexual activity: Not on file  Other Topics Concern  . Not on file  Social History Narrative   Has living will   DNR done 11/12--- he doesn't have it on display though (since he is not sure about this)   Wife is health care POA--then niece Evangeline Dakin    No feeding tube if cognitively unaware    Past Surgical History:  Procedure Laterality Date  . APPENDECTOMY    . COLONOSCOPY  2011  . INGUINAL HERNIA REPAIR     left  . INGUINAL HERNIA REPAIR  5/12   Dr Priscille Heidelberg  . INGUINAL HERNIA REPAIR  5/12   Dr Jamal Collin did redo of this  . JOINT REPLACEMENT  11/13   Left total knee--Dr Hooten  . KNEE SURGERY  2013  . POLYPECTOMY  2011  . PROSTATECTOMY  2010  . TONSILLECTOMY    . VARICOSE VEIN SURGERY     left    Family History  Problem Relation Age of Onset  . Stroke Father   .  Dementia Mother   . Leukemia Sister   . Colon cancer Neg Hx     Allergies  Allergen Reactions  . Naphazoline-Polyethyl Glycol Other (See Comments)    (Afgan) redness    Current Outpatient Medications on File Prior to Visit  Medication Sig Dispense Refill  . aspirin 81 MG tablet Take 81 mg by mouth 4 (four) times a week.     . Cholecalciferol (VITAMIN D3 PO) Take 1,000 Units by mouth daily.    Marland Kitchen ibuprofen (ADVIL,MOTRIN) 200 MG tablet Take 600 mg by mouth daily as needed.    . latanoprost (XALATAN) 0.005 % ophthalmic solution Place 1 drop into both eyes at bedtime.    . Multiple Vitamins-Minerals (PRESERVISION AREDS 2 PO) Take 2 tablets by mouth daily.     . pravastatin (PRAVACHOL) 20 MG tablet TAKE 1 TABLET DAILY 90 tablet 3  . sertraline (ZOLOFT) 50 MG tablet TAKE 1 TABLET DAILY 90 tablet 3   No current facility-administered medications on file prior to visit.     BP 120/64   Pulse 70   Temp 97.8 F (36.6 C) (Oral)   Ht 5' 6.5" (1.689 m)   Wt 186 lb 8 oz (84.6 kg)   SpO2 98%   BMI 29.65 kg/m    Objective:   Physical Exam  Constitutional: He appears well-nourished.  Neck: Neck supple.  Cardiovascular: Normal rate and regular rhythm.  Respiratory: Effort normal and breath sounds normal.  GI: Soft. Bowel sounds are normal. There is no tenderness.  Skin: Skin is warm and dry.           Assessment & Plan:

## 2018-10-15 NOTE — Patient Instructions (Signed)
Start famotidine 40 mg tablets for heartburn. Take 1 tablet by mouth once daily for at least 2-3 weeks. If your symptoms return once completed then it's okay to take this daily.  Please call me in 1-2 weeks if no improvement in your symptoms.  It was a pleasure meeting you!   Food Choices for Gastroesophageal Reflux Disease, Adult When you have gastroesophageal reflux disease (GERD), the foods you eat and your eating habits are very important. Choosing the right foods can help ease your discomfort. What guidelines do I need to follow?  Choose fruits, vegetables, whole grains, and low-fat dairy products.  Choose low-fat meat, fish, and poultry.  Limit fats such as oils, salad dressings, butter, nuts, and avocado.  Keep a food diary. This helps you identify foods that cause symptoms.  Avoid foods that cause symptoms. These may be different for everyone.  Eat small meals often instead of 3 large meals a day.  Eat your meals slowly, in a place where you are relaxed.  Limit fried foods.  Cook foods using methods other than frying.  Avoid drinking alcohol.  Avoid drinking large amounts of liquids with your meals.  Avoid bending over or lying down until 2-3 hours after eating. What foods are not recommended? These are some foods and drinks that may make your symptoms worse: Vegetables Tomatoes. Tomato juice. Tomato and spaghetti sauce. Chili peppers. Onion and garlic. Horseradish. Fruits Oranges, grapefruit, and lemon (fruit and juice). Meats High-fat meats, fish, and poultry. This includes hot dogs, ribs, ham, sausage, salami, and bacon. Dairy Whole milk and chocolate milk. Sour cream. Cream. Butter. Ice cream. Cream cheese. Drinks Coffee and tea. Bubbly (carbonated) drinks or energy drinks. Condiments Hot sauce. Barbecue sauce. Sweets/Desserts Chocolate and cocoa. Donuts. Peppermint and spearmint. Fats and Oils High-fat foods. This includes [place] fries and potato  chips. Other Vinegar. Strong spices. This includes black pepper, white pepper, red pepper, cayenne, curry powder, cloves, ginger, and chili powder. The items listed above may not be a complete list of foods and drinks to avoid. Contact your dietitian for more information. This information is not intended to replace advice given to you by your health care provider. Make sure you discuss any questions you have with your health care provider. Document Released: 05/20/2012 Document Revised: 04/26/2016 Document Reviewed: 09/23/2013 Elsevier Interactive Patient Education  2017 [employer].

## 2018-10-31 DIAGNOSIS — N390 Urinary tract infection, site not specified: Secondary | ICD-10-CM | POA: Diagnosis not present

## 2018-10-31 DIAGNOSIS — R319 Hematuria, unspecified: Secondary | ICD-10-CM | POA: Diagnosis not present

## 2018-11-07 ENCOUNTER — Encounter: Payer: Self-pay | Admitting: Internal Medicine

## 2018-11-07 ENCOUNTER — Ambulatory Visit (INDEPENDENT_AMBULATORY_CARE_PROVIDER_SITE_OTHER): Payer: Medicare Other | Admitting: Internal Medicine

## 2018-11-07 VITALS — BP 138/66 | HR 111 | Temp 97.4°F | Ht 67.0 in | Wt 184.0 lb

## 2018-11-07 DIAGNOSIS — R319 Hematuria, unspecified: Secondary | ICD-10-CM | POA: Insufficient documentation

## 2018-11-07 DIAGNOSIS — R1013 Epigastric pain: Secondary | ICD-10-CM | POA: Diagnosis not present

## 2018-11-07 LAB — COMPREHENSIVE METABOLIC PANEL
ALT: 30 U/L (ref 0–53)
AST: 22 U/L (ref 0–37)
Albumin: 4.2 g/dL (ref 3.5–5.2)
Alkaline Phosphatase: 77 U/L (ref 39–117)
BUN: 20 mg/dL (ref 6–23)
CHLORIDE: 104 meq/L (ref 96–112)
CO2: 30 mEq/L (ref 19–32)
Calcium: 9.6 mg/dL (ref 8.4–10.5)
Creatinine, Ser: 1.27 mg/dL (ref 0.40–1.50)
GFR: 57.49 mL/min — AB (ref >60.00)
Glucose, Bld: 108 mg/dL — ABNORMAL HIGH (ref 70–99)
Potassium: 4.5 mEq/L (ref 3.5–5.1)
Sodium: 141 mEq/L (ref 135–145)
Total Bilirubin: 0.6 mg/dL (ref 0.2–1.2)
Total Protein: 7.8 g/dL (ref 6.0–8.3)

## 2018-11-07 LAB — CBC
HEMATOCRIT: 40.5 % (ref 39.0–52.0)
HEMOGLOBIN: 13.1 g/dL (ref 13.0–17.0)
MCHC: 32.3 g/dL (ref 30.0–36.0)
MCV: 84.3 fl (ref 78.0–100.0)
Platelets: 196 10*3/uL (ref 150.0–400.0)
RBC: 4.8 Mil/uL (ref 4.22–5.81)
RDW: 15.8 % — AB (ref 11.5–15.5)
WBC: 6.7 10*3/uL (ref 4.0–10.5)

## 2018-11-07 MED ORDER — OMEPRAZOLE 20 MG PO CPDR: 20.0000 mg | DELAYED_RELEASE_CAPSULE | Freq: Two times a day (BID) | ORAL | 3 refills | Status: DC

## 2018-11-07 NOTE — Assessment & Plan Note (Signed)
Does appear to be from UTI If this recurs, will send to urology for evaluation

## 2018-11-07 NOTE — Addendum Note (Signed)
Addended by: Lendon Collar on: 11/07/2018 10:13 AM   Modules accepted: Orders

## 2018-11-07 NOTE — Addendum Note (Signed)
Addended by: Ellamae Sia on: 11/07/2018 09:34 AM   Modules accepted: Orders

## 2018-11-07 NOTE — Patient Instructions (Signed)
Please take the omeprazole twice a day on an empty stomach. If your pain is not gone in 1-2 weeks, let me know and I will set you up with a GI specialist. If the pain goes away, continue the medication for 6-8 weeks, then you can stop again. You probably should take it if you need the ibuprofen (it will protect your stomach). Let me know if the blood comes back in your urine.

## 2018-11-07 NOTE — Progress Notes (Signed)
Subjective:    Patient ID: Juan Horn, male    DOB: 02-Dec-1933, 84 y.o.   MRN: [id number]  HPI Here due to ongoing epigastric pain Burning feeling Tried tums, rolaids and famotidine Hasn't touched it Feels fairly continuous No better or worse with eating  No N/V Bowels "more firm" but normal. No blood Appetite is fine (tries to eat less though)  Had episode of hematuria Then it cleared Went to urgent care---diagnosed with UTI and given cipro Urinalysis showed blood and nitrites. 10-50 WBC Culture showed pansensitive E coli. Better now No fever No recurrence of blood  Current Outpatient Medications on File Prior to Visit  Medication Sig Dispense Refill  . aspirin 81 MG tablet Take 81 mg by mouth 4 (four) times a week.     . Cholecalciferol (VITAMIN D3 PO) Take 1,000 Units by mouth daily.    . ciprofloxacin (CIPRO) 500 MG tablet Take by mouth.    Marland Kitchen ibuprofen (ADVIL,MOTRIN) 200 MG tablet Take 600 mg by mouth daily as needed.    . latanoprost (XALATAN) 0.005 % ophthalmic solution Place 1 drop into both eyes at bedtime.    . Multiple Vitamins-Minerals (PRESERVISION AREDS 2 PO) Take 2 tablets by mouth daily.     . pravastatin (PRAVACHOL) 20 MG tablet TAKE 1 TABLET DAILY 90 tablet 3  . sertraline (ZOLOFT) 50 MG tablet TAKE 1 TABLET DAILY 90 tablet 3   No current facility-administered medications on file prior to visit.     Allergies  Allergen Reactions  . Naphazoline-Polyethyl Glycol Other (See Comments)    (Afgan) redness    Past Medical History:  Diagnosis Date  . Anxiety   . Cancer [facility]) 2010   Prostate Cancer  . Cataract    left eye  . Chronic renal disease, stage III ([place])   . Chronic venous insufficiency   . Clotting disorder ([place]) 2013   blood clot 3 days post knee surgery  . Diverticulosis of colon   . Glaucoma   . HLD (hyperlipidemia)   . Hx of colonic polyp   . Macular degeneration    legally blind in right eye  . OA (osteoarthritis)     . Personal history of prostate cancer   . Pulmonary embolism ([place]) 11/13   post op TKR  . Spinal stenosis of lumbar region     Past Surgical History:  Procedure Laterality Date  . APPENDECTOMY    . COLONOSCOPY  2011  . INGUINAL HERNIA REPAIR     left  . INGUINAL HERNIA REPAIR  5/12   Dr Priscille Heidelberg  . INGUINAL HERNIA REPAIR  5/12   Dr Jamal Collin did redo of this  . JOINT REPLACEMENT  11/13   Left total knee--Dr Hooten  . KNEE SURGERY  2013  . POLYPECTOMY  2011  . PROSTATECTOMY  2010  . TONSILLECTOMY    . VARICOSE VEIN SURGERY     left    Family History  Problem Relation Age of Onset  . Stroke Father   . Dementia Mother   . Leukemia Sister   . Colon cancer Neg Hx     Social History   Socioeconomic History  . Marital status: Married    Spouse name: Not on file  . Number of children: 0  . Years of education: Not on file  . Highest education level: Not on file  Occupational History  . 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 Financial resource strain: Not on file  .  Food insecurity:    Worry: Not on file    Inability: Not on file  . Transportation needs:    Medical: Not on file    Non-medical: Not on file  Tobacco Use  . Smoking status: Former Smoker    Types: Cigarettes    Last attempt to quit: 12/03/1978    Years since quitting: 39.9  . Smokeless tobacco: Never Used  Substance and Sexual Activity  . Alcohol use: Yes    Alcohol/week: 7.0 standard drinks    Types: 7 Glasses of wine per week  . Drug use: No  . Sexual activity: Not on file  Lifestyle  . Physical activity:    Days per week: Not on file    Minutes per session: Not on file  . Stress: Not on file  Relationships  . Social connections:    Talks on phone: Not on file    Gets together: Not on file    Attends religious service: Not on file    Active member of club or organization: Not on file    Attends meetings of clubs or organizations: Not on file    Relationship  status: Not on file  . Intimate partner violence:    Fear of current or ex partner: Not on file    Emotionally abused: Not on file    Physically abused: Not on file    Forced sexual activity: Not on file  Other Topics Concern  . Not on file  Social History Narrative   Has living will   DNR done 11/12--- he doesn't have it on display though (since he is not sure about this)   Wife is health care POA--then niece Evangeline Dakin    No feeding tube if cognitively unaware   Review of Systems Weight stable Usually sleeps okay--but not last night Takes ibuprofen rarely---not in the past week No dysphagia    Objective:   Physical Exam  Constitutional: He appears well-developed. No distress.  Neck: No thyromegaly present.  Cardiovascular: Normal rate, regular rhythm and normal heart sounds. Exam reveals no gallop.  No murmur heard. Respiratory: Effort normal and breath sounds normal. No respiratory distress. He has no wheezes. He has no rales.  GI: Soft. Bowel sounds are normal. He exhibits no distension and no mass. There is no rebound and no guarding.  Mild epigastric tenderness  Musculoskeletal: He exhibits no edema.  Lymphadenopathy:    He has no cervical adenopathy.  Psychiatric: He has a normal mood and affect. His behavior is normal.           Assessment & Plan:

## 2018-11-07 NOTE — Assessment & Plan Note (Signed)
Not reflux by history Takes some NSAID--but not much and that usually doesn't cause painful ulcers Could be gastritis or gastric ulcer Will check labs/H pylori Trial omeprazole bid GI evaluation if persists

## 2018-11-11 DIAGNOSIS — I493 Ventricular premature depolarization: Secondary | ICD-10-CM | POA: Diagnosis not present

## 2018-11-11 DIAGNOSIS — R001 Bradycardia, unspecified: Secondary | ICD-10-CM | POA: Diagnosis not present

## 2018-11-11 DIAGNOSIS — E785 Hyperlipidemia, unspecified: Secondary | ICD-10-CM | POA: Diagnosis not present

## 2018-11-11 LAB — H PYLORI, IGM, IGG, IGA AB
H pylori, IgM Abs: <9 U (ref 0.0–8.9)
H. pylori, IgA Abs: 19.9 U — ABNORMAL HIGH (ref 0.0–8.9)
H. pylori, IgG AbS: 0.27 {index_val} (ref 0.00–0.79)

## 2018-11-20 DIAGNOSIS — M48062 Spinal stenosis, lumbar region with neurogenic claudication: Secondary | ICD-10-CM | POA: Diagnosis not present

## 2018-11-20 DIAGNOSIS — M1711 Unilateral primary osteoarthritis, right knee: Secondary | ICD-10-CM | POA: Diagnosis not present

## 2018-11-20 DIAGNOSIS — M25561 Pain in right knee: Secondary | ICD-10-CM | POA: Diagnosis not present

## 2018-11-21 DIAGNOSIS — H401134 Primary open-angle glaucoma, bilateral, indeterminate stage: Secondary | ICD-10-CM | POA: Diagnosis not present

## 2018-11-21 DIAGNOSIS — H353212 Exudative age-related macular degeneration, right eye, with inactive choroidal neovascularization: Secondary | ICD-10-CM | POA: Diagnosis not present

## 2018-12-23 DIAGNOSIS — L570 Actinic keratosis: Secondary | ICD-10-CM | POA: Diagnosis not present

## 2018-12-23 DIAGNOSIS — L219 Seborrheic dermatitis, unspecified: Secondary | ICD-10-CM | POA: Diagnosis not present

## 2018-12-23 DIAGNOSIS — C44319 Basal cell carcinoma of skin of other parts of face: Secondary | ICD-10-CM | POA: Diagnosis not present

## 2018-12-23 DIAGNOSIS — Z1283 Encounter for screening for malignant neoplasm of skin: Secondary | ICD-10-CM | POA: Diagnosis not present

## 2018-12-23 DIAGNOSIS — L821 Other seborrheic keratosis: Secondary | ICD-10-CM | POA: Diagnosis not present

## 2018-12-23 DIAGNOSIS — Z85828 Personal history of other malignant neoplasm of skin: Secondary | ICD-10-CM | POA: Diagnosis not present

## 2018-12-23 DIAGNOSIS — D1800 Hemangioma unspecified site: Secondary | ICD-10-CM | POA: Diagnosis not present

## 2018-12-23 DIAGNOSIS — C44311 Basal cell carcinoma of skin of nose: Secondary | ICD-10-CM | POA: Diagnosis not present

## 2018-12-23 DIAGNOSIS — D485 Neoplasm of uncertain behavior of skin: Secondary | ICD-10-CM | POA: Diagnosis not present

## 2018-12-23 DIAGNOSIS — L739 Follicular disorder, unspecified: Secondary | ICD-10-CM | POA: Diagnosis not present

## 2018-12-23 HISTORY — DX: Personal history of other malignant neoplasm of skin: Z85.828

## 2019-01-06 ENCOUNTER — Other Ambulatory Visit: Payer: Self-pay | Admitting: Primary Care

## 2019-01-06 DIAGNOSIS — K219 Gastro-esophageal reflux disease without esophagitis: Secondary | ICD-10-CM

## 2019-01-12 DIAGNOSIS — Z85828 Personal history of other malignant neoplasm of skin: Secondary | ICD-10-CM | POA: Diagnosis not present

## 2019-01-12 DIAGNOSIS — C44311 Basal cell carcinoma of skin of nose: Secondary | ICD-10-CM | POA: Diagnosis not present

## 2019-01-12 DIAGNOSIS — C44319 Basal cell carcinoma of skin of other parts of face: Secondary | ICD-10-CM | POA: Diagnosis not present

## 2019-01-12 DIAGNOSIS — L578 Other skin changes due to chronic exposure to nonionizing radiation: Secondary | ICD-10-CM | POA: Diagnosis not present

## 2019-01-30 ENCOUNTER — Other Ambulatory Visit: Payer: Self-pay | Admitting: Internal Medicine

## 2019-05-08 ENCOUNTER — Encounter: Payer: Medicare Other | Admitting: Internal Medicine

## 2019-05-14 ENCOUNTER — Other Ambulatory Visit: Payer: Self-pay

## 2019-05-14 ENCOUNTER — Ambulatory Visit (INDEPENDENT_AMBULATORY_CARE_PROVIDER_SITE_OTHER): Payer: Medicare Other | Admitting: Internal Medicine

## 2019-05-14 ENCOUNTER — Encounter: Payer: Self-pay | Admitting: Internal Medicine

## 2019-05-14 DIAGNOSIS — N183 Chronic kidney disease, stage 3 unspecified: Secondary | ICD-10-CM

## 2019-05-14 DIAGNOSIS — M48062 Spinal stenosis, lumbar region with neurogenic claudication: Secondary | ICD-10-CM | POA: Diagnosis not present

## 2019-05-14 DIAGNOSIS — M1711 Unilateral primary osteoarthritis, right knee: Secondary | ICD-10-CM | POA: Diagnosis not present

## 2019-05-14 DIAGNOSIS — Z7189 Other specified counseling: Secondary | ICD-10-CM | POA: Diagnosis not present

## 2019-05-14 DIAGNOSIS — Z0000 Encounter for general adult medical examination without abnormal findings: Secondary | ICD-10-CM

## 2019-05-14 DIAGNOSIS — F321 Major depressive disorder, single episode, moderate: Secondary | ICD-10-CM | POA: Diagnosis not present

## 2019-05-14 NOTE — Assessment & Plan Note (Signed)
GFR in high 50's Will check at next visit

## 2019-05-14 NOTE — Assessment & Plan Note (Signed)
Still has stable pseudoclaudication No problems at rest

## 2019-05-14 NOTE — Progress Notes (Signed)
Subjective:    Patient ID: Juan Horn, male    DOB: [birth year], 85 y.o.   MRN: [id number]  HPI Virtual visit for Medicare wellness and follow up of chronic health conditions Identity confirmed Reviewed billing and he gave consent He is at home and I am in my office  He feels good No surgery or hospitalizations in past year Vision is not as good---may need cataract surgery Has hearing aide--they help Nightly wine with dinner No tobacco products Other doctors---Dr Brasington--eye, Dr Paraschos--cardiology, Dr Areatha Keas, Dr Nicole Kindred --derm No falls Helps with instrumental ADLs---only limitation is his knee Memory seems to be okay---wife notes some minor issues Has been exercising regularly  Depression has not recurred Not anhedonic No anxiety  Ongoing knee pain--ibuprofen does help (600mg  once a day) Back is about the same---will come on after walking a few hundred yards.\ Goes away with rest Reviewed renal function---GFR stable in high 50's (despite the ibuprofen)  On the omeprazole No stomach pain on this No dysphagia  Saw cardiology in December Heart is irregular at times but no medication (PVCs?) No chest pain or SOB No dizziness or syncope No edema No palpitations  Remains on statin No myalgias or GI symptoms  Current Outpatient Medications on File Prior to Visit  Medication Sig Dispense Refill  . Cholecalciferol (VITAMIN D3 PO) Take 1,000 Units by mouth daily.    Marland Kitchen ibuprofen (ADVIL,MOTRIN) 200 MG tablet Take 600 mg by mouth daily as needed.    . latanoprost (XALATAN) 0.005 % ophthalmic solution Place 1 drop into both eyes at bedtime.    . Multiple Vitamins-Minerals (PRESERVISION AREDS 2 PO) Take 2 tablets by mouth daily.     Marland Kitchen omeprazole (PRILOSEC) 20 MG capsule TAKE 1 CAPSULE (20 MG TOTAL) BY MOUTH 2 (TWO) TIMES DAILY BEFORE A MEAL. 180 capsule 1  . pravastatin (PRAVACHOL) 20 MG tablet TAKE 1 TABLET DAILY 90 tablet 3  . sertraline (ZOLOFT) 50  MG tablet TAKE 1 TABLET DAILY 90 tablet 3   No current facility-administered medications on file prior to visit.     Allergies  Allergen Reactions  . Naphazoline-Polyethyl Glycol Other (See Comments)    (Afgan) redness    Past Medical History:  Diagnosis Date  . Anxiety   . Cancer [facility]) 2010   Prostate Cancer  . Cataract    left eye  . Chronic renal disease, stage III ([place])   . Chronic venous insufficiency   . Clotting disorder ([place]) 2013   blood clot 3 days post knee surgery  . Diverticulosis of colon   . Glaucoma   . HLD (hyperlipidemia)   . Hx of colonic polyp   . Macular degeneration    legally blind in right eye  . OA (osteoarthritis)   . Personal history of prostate cancer   . Pulmonary embolism ([place]) 11/13   post op TKR  . Spinal stenosis of lumbar region     Past Surgical History:  Procedure Laterality Date  . APPENDECTOMY    . COLONOSCOPY  2011  . INGUINAL HERNIA REPAIR     left  . INGUINAL HERNIA REPAIR  5/12   Dr Priscille Heidelberg  . INGUINAL HERNIA REPAIR  5/12   Dr Jamal Collin did redo of this  . JOINT REPLACEMENT  11/13   Left total knee--Dr Hooten  . KNEE SURGERY  2013  . POLYPECTOMY  2011  . PROSTATECTOMY  2010  . TONSILLECTOMY    . VARICOSE VEIN SURGERY     left  Family History  Problem Relation Age of Onset  . Stroke Father   . Dementia Mother   . Leukemia Sister   . Colon cancer Neg Hx     Social History   Socioeconomic History  . Marital status: Married    Spouse name: Not on file  . Number of children: 0  . Years of education: Not on file  . Highest education level: Not on file  Occupational History  . 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 Financial resource strain: Not on file  . Food insecurity    Worry: Not on file    Inability: Not on file  . Transportation needs    Medical: Not on file    Non-medical: Not on file  Tobacco Use  . Smoking status: Former Smoker    Types: Cigarettes    Quit  date: 12/03/1978    Years since quitting: 40.4  . Smokeless tobacco: Never Used  Substance and Sexual Activity  . Alcohol use: Yes    Alcohol/week: 7.0 standard drinks    Types: 7 Glasses of wine per week  . Drug use: No  . Sexual activity: Not on file  Lifestyle  . Physical activity    Days per week: Not on file    Minutes per session: Not on file  . Stress: Not on file  Relationships  . Social Herbalist on phone: Not on file    Gets together: Not on file    Attends religious service: Not on file    Active member of club or organization: Not on file    Attends meetings of clubs or organizations: Not on file    Relationship status: Not on file  . Intimate partner violence    Fear of current or ex partner: Not on file    Emotionally abused: Not on file    Physically abused: Not on file    Forced sexual activity: Not on file  Other Topics Concern  . Not on file  Social History Narrative   Has living will   DNR done 11/12--- he doesn't have it on display though (since he is not sure about this)   Wife is health care POA--then niece Evangeline Dakin    No feeding tube if cognitively unaware   Review of Systems No headaches Appetite is good Wife is being careful with their diets---weight down a bit No headaches Wears seat belt Sleeps well Bowels are fine. No blood Voids okay. No nocturia No current suspicious skin lesions    Objective:   Physical Exam  Constitutional: He appears well-developed. No distress.  Respiratory: Effort normal. No respiratory distress.  Neurological:  President--- "Daisy Floro, Obama, Bush" [phone] D-l-o-r-w Recall 3/3  Psychiatric: He has a normal mood and affect. His behavior is normal.           Assessment & Plan:

## 2019-05-14 NOTE — Assessment & Plan Note (Signed)
Does well with the ibuprofen daily Renal function stable Is on omeprazole to protect stomach

## 2019-05-14 NOTE — Assessment & Plan Note (Signed)
I have personally reviewed the Medicare Annual Wellness questionnaire and have noted 1. The patient's medical and social history 2. Their use of alcohol, tobacco or illicit drugs 3. Their current medications and supplements 4. The patient's functional ability including ADL's, fall risks, home safety risks and hearing or visual             impairment. 5. Diet and physical activities 6. Evidence for depression or mood disorders  The patients weight, height, BMI and visual acuity have been recorded in the chart I have made referrals, counseling and provided education to the patient based review of the above and I have provided the pt with a written personalized care plan for preventive services.  I have provided you with a copy of your personalized plan for preventive services. Please take the time to review along with your updated medication list.  Yearly flu shot He and wife are exercising regularly Will check at the pharmacy for the shingrix No cancer screening due to age

## 2019-05-14 NOTE — Progress Notes (Signed)
Hearing Screening   125Hz  250Hz  500Hz  1000Hz  2000Hz  3000Hz  4000Hz  6000Hz  8000Hz   Right ear:           Left ear:           Comments: Has Hearing Aids  Vision Screening Comments: December 2019

## 2019-05-14 NOTE — Assessment & Plan Note (Signed)
>>  ASSESSMENT AND PLAN FOR STAGE 3A CHRONIC KIDNEY DISEASE (HCC) WRITTEN ON 05/14/2019  3:10 PM BY Lakai Moree I, MD  GFR in high 50's Will check at next visit

## 2019-05-14 NOTE — Assessment & Plan Note (Signed)
Has a DNR form--but currently not active Would mobilize the form to be visible if his health takes downturn

## 2019-05-14 NOTE — Assessment & Plan Note (Signed)
Remains in remission  Will continue the sertaline--probably indefinitely

## 2019-05-26 DIAGNOSIS — H353122 Nonexudative age-related macular degeneration, left eye, intermediate dry stage: Secondary | ICD-10-CM | POA: Diagnosis not present

## 2019-05-26 DIAGNOSIS — H401134 Primary open-angle glaucoma, bilateral, indeterminate stage: Secondary | ICD-10-CM | POA: Diagnosis not present

## 2019-06-08 ENCOUNTER — Other Ambulatory Visit: Payer: Self-pay | Admitting: Internal Medicine

## 2019-06-09 DIAGNOSIS — K219 Gastro-esophageal reflux disease without esophagitis: Secondary | ICD-10-CM | POA: Diagnosis not present

## 2019-06-09 DIAGNOSIS — I499 Cardiac arrhythmia, unspecified: Secondary | ICD-10-CM | POA: Diagnosis not present

## 2019-06-09 DIAGNOSIS — H2512 Age-related nuclear cataract, left eye: Secondary | ICD-10-CM | POA: Diagnosis not present

## 2019-06-16 ENCOUNTER — Encounter: Payer: Self-pay | Admitting: *Deleted

## 2019-06-16 ENCOUNTER — Other Ambulatory Visit: Payer: Self-pay

## 2019-06-16 NOTE — Anesthesia Preprocedure Evaluation (Addendum)
Anesthesia Evaluation  Patient identified by MRN, date of birth, ID band Patient awake    Reviewed: Allergy & Precautions, NPO status , Patient's Chart, lab work & pertinent test results  History of Anesthesia Complications Negative for: history of anesthetic complications  Airway Mallampati: I   Neck ROM: Full    Dental no notable dental hx.    Pulmonary former smoker (quit 1980),    Pulmonary exam normal breath sounds clear to auscultation       Cardiovascular Exercise Tolerance: Good Normal cardiovascular exam Rhythm:Regular Rate:Normal  Bradycardia, PVCs; DVT 2012 postop, PE 2013 postop; chronic venous insufficiency   Neuro/Psych PSYCHIATRIC DISORDERS Anxiety Depression negative neurological ROS     GI/Hepatic negative GI ROS,   Endo/Other  negative endocrine ROS  Renal/GU Renal disease (stage III CKD)     Musculoskeletal  (+) Arthritis ,   Abdominal   Peds  Hematology Prostate CA   Anesthesia Other Findings Cardiology note 05/14/18:  85 year old male with admission to [facility] for dizziness and perceived bradycardia in the setting of frequent premature ventricular contractions, with one recent episode of mild dizziness. The patient denies palpitations, heart racing or syncope. Review 48-hour Holter monitor revealed predominant normal sinus rhythm with a mean heart rate of 66 bpm with frequent PACs and PVCs. Occasional atrial runs were present, the longest lasting 7 beats, of uncertain clinical significance.2D echocardiogram revealed normal left ventricular function. Patient has hyperlipidemia with elevated triglycerides and mildly elevated LDL on pravastatin.  Plan   1. Continue current medications. 2. Counseled patient about low sodium diet. 3. DASH diet printed instructions given to the patient. 4. Counseled patient about low cholesterol diet. 5. Low cholesterol diet printed instructions given to the patient. 6.  Continue pravastatin for hyperlipidemia management. 7. Return to clinic for follow-up in 6 months  No orders of the defined types were placed in this encounter.  Return in about 6 months (around 11/13/2018).   Isaias Cowman, MD PhD [facility]   Reproductive/Obstetrics                            Anesthesia Physical Anesthesia Plan  ASA: III  Anesthesia Plan: MAC   Post-op Pain Management:    Induction: Intravenous  PONV Risk Score and Plan: 1 and TIVA and Midazolam  Airway Management Planned: Natural Airway  Additional Equipment:   Intra-op Plan:   Post-operative Plan:   Informed Consent: I have reviewed the patients History and Physical, chart, labs and discussed the procedure including the risks, benefits and alternatives for the proposed anesthesia with the patient or authorized representative who has indicated his/her understanding and acceptance.       Plan Discussed with: CRNA  Anesthesia Plan Comments:        Anesthesia Quick Evaluation

## 2019-06-19 ENCOUNTER — Other Ambulatory Visit: Payer: Self-pay

## 2019-06-19 ENCOUNTER — Other Ambulatory Visit
Admission: RE | Admit: 2019-06-19 | Discharge: 2019-06-19 | Disposition: A | Discharge status: Home / Self Care | Payer: Medicare Other | Source: Ambulatory Visit | Attending: Ophthalmology | Admitting: Ophthalmology

## 2019-06-19 DIAGNOSIS — Z1159 Encounter for screening for other viral diseases: Secondary | ICD-10-CM | POA: Insufficient documentation

## 2019-06-19 LAB — SARS CORONAVIRUS 2 (TAT 6-24 HRS): SARS Coronavirus 2: NEGATIVE

## 2019-06-19 NOTE — Discharge Instructions (Signed)

## 2019-06-22 DIAGNOSIS — S0511XA Contusion of eyeball and orbital tissues, right eye, initial encounter: Secondary | ICD-10-CM | POA: Diagnosis not present

## 2019-06-22 DIAGNOSIS — W010XXA Fall on same level from slipping, tripping and stumbling without subsequent striking against object, initial encounter: Secondary | ICD-10-CM | POA: Diagnosis not present

## 2019-06-23 DIAGNOSIS — Z1283 Encounter for screening for malignant neoplasm of skin: Secondary | ICD-10-CM | POA: Diagnosis not present

## 2019-06-23 DIAGNOSIS — L739 Follicular disorder, unspecified: Secondary | ICD-10-CM | POA: Diagnosis not present

## 2019-06-23 DIAGNOSIS — D692 Other nonthrombocytopenic purpura: Secondary | ICD-10-CM | POA: Diagnosis not present

## 2019-06-23 DIAGNOSIS — L570 Actinic keratosis: Secondary | ICD-10-CM | POA: Diagnosis not present

## 2019-06-23 DIAGNOSIS — D1800 Hemangioma unspecified site: Secondary | ICD-10-CM | POA: Diagnosis not present

## 2019-06-23 DIAGNOSIS — Z85828 Personal history of other malignant neoplasm of skin: Secondary | ICD-10-CM | POA: Diagnosis not present

## 2019-06-23 DIAGNOSIS — L821 Other seborrheic keratosis: Secondary | ICD-10-CM | POA: Diagnosis not present

## 2019-06-24 ENCOUNTER — Ambulatory Visit: Payer: Medicare Other | Admitting: Anesthesiology

## 2019-06-24 ENCOUNTER — Ambulatory Visit
Admission: RE | Admit: 2019-06-24 | Discharge: 2019-06-24 | Disposition: A | Discharge status: Home / Self Care | Payer: Medicare Other | Attending: Ophthalmology | Admitting: Ophthalmology

## 2019-06-24 ENCOUNTER — Encounter: Payer: Self-pay | Admitting: *Deleted

## 2019-06-24 ENCOUNTER — Encounter
Admission: RE | Disposition: A | Discharge status: Home / Self Care | Payer: Self-pay | Source: Home / Self Care | Attending: Ophthalmology

## 2019-06-24 DIAGNOSIS — N183 Chronic kidney disease, stage 3 (moderate): Secondary | ICD-10-CM | POA: Insufficient documentation

## 2019-06-24 DIAGNOSIS — Z86718 Personal history of other venous thrombosis and embolism: Secondary | ICD-10-CM | POA: Diagnosis not present

## 2019-06-24 DIAGNOSIS — Z8546 Personal history of malignant neoplasm of prostate: Secondary | ICD-10-CM | POA: Diagnosis not present

## 2019-06-24 DIAGNOSIS — F419 Anxiety disorder, unspecified: Secondary | ICD-10-CM | POA: Insufficient documentation

## 2019-06-24 DIAGNOSIS — Z86711 Personal history of pulmonary embolism: Secondary | ICD-10-CM | POA: Diagnosis not present

## 2019-06-24 DIAGNOSIS — Z96652 Presence of left artificial knee joint: Secondary | ICD-10-CM | POA: Diagnosis not present

## 2019-06-24 DIAGNOSIS — H25812 Combined forms of age-related cataract, left eye: Secondary | ICD-10-CM | POA: Diagnosis not present

## 2019-06-24 DIAGNOSIS — Z79899 Other long term (current) drug therapy: Secondary | ICD-10-CM | POA: Insufficient documentation

## 2019-06-24 DIAGNOSIS — Z87891 Personal history of nicotine dependence: Secondary | ICD-10-CM | POA: Insufficient documentation

## 2019-06-24 DIAGNOSIS — F329 Major depressive disorder, single episode, unspecified: Secondary | ICD-10-CM | POA: Diagnosis not present

## 2019-06-24 DIAGNOSIS — H2512 Age-related nuclear cataract, left eye: Secondary | ICD-10-CM | POA: Diagnosis not present

## 2019-06-24 DIAGNOSIS — E7800 Pure hypercholesterolemia, unspecified: Secondary | ICD-10-CM | POA: Diagnosis not present

## 2019-06-24 DIAGNOSIS — H5703 Miosis: Secondary | ICD-10-CM | POA: Insufficient documentation

## 2019-06-24 HISTORY — PX: CATARACT EXTRACTION W/PHACO: SHX586

## 2019-06-24 HISTORY — DX: Ventricular premature depolarization: I49.3

## 2019-06-24 SURGERY — PHACOEMULSIFICATION, CATARACT, WITH IOL INSERTION
Anesthesia: Monitor Anesthesia Care | Site: Eye | Laterality: Left

## 2019-06-24 MED ORDER — MOXIFLOXACIN HCL 0.5 % OP SOLN
1.0000 [drp] | OPHTHALMIC | Status: DC | PRN
  Administered 2019-06-24 (×3): 1 [drp] via OPHTHALMIC

## 2019-06-24 MED ORDER — ARMC OPHTHALMIC DILATING DROPS
1.0000 "application " | OPHTHALMIC | Status: DC | PRN
  Administered 2019-06-24 (×3): 1 via OPHTHALMIC

## 2019-06-24 MED ORDER — ACETAMINOPHEN 325 MG PO TABS: 650.0000 mg | ORAL_TABLET | Freq: Once | ORAL | Status: DC | PRN

## 2019-06-24 MED ORDER — NA HYALUR & NA CHOND-NA HYALUR 0.4-0.35 ML IO KIT
PACK | INTRAOCULAR | Status: DC | PRN
  Administered 2019-06-24: 1 mL via INTRAOCULAR

## 2019-06-24 MED ORDER — LIDOCAINE HCL (PF) 2 % IJ SOLN
INTRAOCULAR | Status: DC | PRN
  Administered 2019-06-24: 1 mL

## 2019-06-24 MED ORDER — MIDAZOLAM HCL 2 MG/2ML IJ SOLN
INTRAMUSCULAR | Status: DC | PRN
  Administered 2019-06-24: 1 mg via INTRAVENOUS

## 2019-06-24 MED ORDER — ACETAMINOPHEN 160 MG/5ML PO SOLN: 325.0000 mg | ORAL | Status: DC | PRN

## 2019-06-24 MED ORDER — ONDANSETRON HCL 4 MG/2ML IJ SOLN: 4.0000 mg | Freq: Once | INTRAMUSCULAR | Status: DC | PRN

## 2019-06-24 MED ORDER — EPINEPHRINE PF 1 MG/ML IJ SOLN
INTRAOCULAR | Status: DC | PRN
  Administered 2019-06-24: 10:00:00 104 mL via OPHTHALMIC

## 2019-06-24 MED ORDER — FENTANYL CITRATE (PF) 100 MCG/2ML IJ SOLN
INTRAMUSCULAR | Status: DC | PRN
  Administered 2019-06-24: 50 ug via INTRAVENOUS

## 2019-06-24 MED ORDER — CEFUROXIME OPHTHALMIC INJECTION 1 MG/0.1 ML
INJECTION | OPHTHALMIC | Status: DC | PRN
  Administered 2019-06-24: 0.1 mL via INTRACAMERAL

## 2019-06-24 MED ORDER — TETRACAINE HCL 0.5 % OP SOLN
1.0000 [drp] | OPHTHALMIC | Status: DC | PRN
  Administered 2019-06-24 (×3): 1 [drp] via OPHTHALMIC

## 2019-06-24 SURGICAL SUPPLY — 21 items
CANNULA ANT/CHMB 27G (MISCELLANEOUS) ×1 IMPLANT
CANNULA ANT/CHMB 27GA (MISCELLANEOUS) ×3 IMPLANT
GLOVE SURG LX 7.5 STRW (GLOVE) ×2
GLOVE SURG LX STRL 7.5 STRW (GLOVE) ×1 IMPLANT
GLOVE SURG TRIUMPH 8.0 PF LTX (GLOVE) ×3 IMPLANT
GOWN STRL REUS W/ TWL LRG LVL3 (GOWN DISPOSABLE) ×2 IMPLANT
GOWN STRL REUS W/TWL LRG LVL3 (GOWN DISPOSABLE) ×4
LENS IOL TECNIS ITEC 22.0 (Intraocular Lens) ×2 IMPLANT
MARKER SKIN DUAL TIP RULER LAB (MISCELLANEOUS) ×3 IMPLANT
NDL FILTER BLUNT 18X1 1/2 (NEEDLE) ×1 IMPLANT
NEEDLE FILTER BLUNT 18X 1/2SAF (NEEDLE) ×2
NEEDLE FILTER BLUNT 18X1 1/2 (NEEDLE) ×1 IMPLANT
PACK CATARACT BRASINGTON (MISCELLANEOUS) ×3 IMPLANT
PACK EYE AFTER SURG (MISCELLANEOUS) ×3 IMPLANT
PACK OPTHALMIC (MISCELLANEOUS) ×3 IMPLANT
RING MALYGIN 7.0 (MISCELLANEOUS) ×2 IMPLANT
SYR 3ML LL SCALE MARK (SYRINGE) ×3 IMPLANT
SYR 5ML LL (SYRINGE) ×3 IMPLANT
SYR TB 1ML LUER SLIP (SYRINGE) ×3 IMPLANT
WATER STERILE IRR 500ML POUR (IV SOLUTION) ×3 IMPLANT
WIPE NON LINTING 3.25X3.25 (MISCELLANEOUS) ×3 IMPLANT

## 2019-06-24 NOTE — Transfer of Care (Signed)
Immediate Anesthesia Transfer of Care Note  Patient: Juan Horn  Procedure(s) Performed: CATARACT EXTRACTION PHACO AND INTRAOCULAR LENS PLACEMENT (IOC) LEFT (Left Eye)  Patient Location: PACU  Anesthesia Type: MAC  Level of Consciousness: awake, alert  and patient cooperative  Airway and Oxygen Therapy: Patient Spontanous Breathing and Patient connected to supplemental oxygen  Post-op Assessment: Post-op Vital signs reviewed, Patient's Cardiovascular Status Stable, Respiratory Function Stable, Patent Airway and No signs of Nausea or vomiting  Post-op Vital Signs: Reviewed and stable  Complications: No apparent anesthesia complications

## 2019-06-24 NOTE — Anesthesia Procedure Notes (Signed)
Procedure Name: MAC Performed by: Laikynn Pollio, CRNA Pre-anesthesia Checklist: Patient identified, Emergency Drugs available, Suction available, Timeout performed and Patient being monitored Patient Re-evaluated:Patient Re-evaluated prior to induction Oxygen Delivery Method: Nasal cannula Placement Confirmation: positive ETCO2       

## 2019-06-24 NOTE — H&P (Signed)

## 2019-06-24 NOTE — Op Note (Signed)
OPERATIVE NOTE  Juan Horn [id number] 06/24/2019  PREOPERATIVE DIAGNOSIS:   Nuclear sclerotic cataract left eye with miotic pupil      H25.12   POSTOPERATIVE DIAGNOSIS:   Nuclear sclerotic cataract left eye with miotic pupil.     PROCEDURE:  Phacoemulsification with posterior chamber intraocular lens implantation of the left eye which required pupil stretching with the Malyugin pupil expansion device   LENS:   Implant Name Type Inv. Item Serial No. Manufacturer Lot No. LRB No. Used Action  LENS IOL DIOP 22.0 - [id number] Intraocular Lens LENS IOL DIOP 22.0 [id number]  Left 1 Implanted        ULTRASOUND TIME: 25 % of 2 minutes, 33 seconds.  CDE 38.9   SURGEON:  Wyonia Hough, MD   ANESTHESIA: Topical with tetracaine drops and 2% Xylocaine jelly, augmented with 1% preservative-free intracameral lidocaine.   COMPLICATIONS:  None.   DESCRIPTION OF PROCEDURE:  The patient was identified in the holding room and transported to the operating room and placed in the supine position under the operating microscope.  The left eye was identified as the operative eye and it was prepped and draped in the usual sterile ophthalmic fashion.   A 1 millimeter clear-corneal paracentesis was made at the 1:30 position.  The anterior chamber was filled with Viscoat viscoelastic.  0.5 ml of preservative-free 1% lidocaine was injected into the anterior chamber.  A 2.4 millimeter keratome was used to make a near-clear corneal incision at the 10:30 position.  A Malyugin pupil expander was then placed through the main incision and into the anterior chamber of the eye.  The edge of the iris was secured on the lip of the pupil expander and it was released, thereby expanding the pupil to approximately 7 millimeters for completion of the cataract surgery.  Additional Viscoat was placed in the anterior chamber.  A cystotome and capsulorrhexis forceps were used to make a curvilinear capsulorrhexis.    Balanced salt solution was used to hydrodissect and hydrodelineate the lens nucleus.   Phacoemulsification was used in stop and chop fashion to remove the lens, nucleus and epinucleus.  The remaining cortex was aspirated using the irrigation aspiration handpiece.  Additional Provisc was placed into the eye to distend the capsular bag for lens placement.  A lens was then injected into the capsular bag.  The pupil expanding ring was removed using a Kuglen hook and insertion device. The remaining viscoelastic was aspirated from the capsular bag and the anterior chamber.  The anterior chamber was filled with balanced salt solution to inflate to a physiologic pressure.   Wounds were hydrated with balanced salt solution.  The anterior chamber was inflated to a physiologic pressure with balanced salt solution.  No wound leaks were noted. Cefuroxime 0.1 ml of a 10mg /ml solution was injected into the anterior chamber for a dose of 1 mg of intracameral antibiotic at the completion of the case.   Timolol and Brimonidine drops were applied to the eye.  The patient was taken to the recovery room in stable condition without complications of anesthesia or surgery.  [name] 06/24/2019, 10:14 AM

## 2019-06-24 NOTE — Anesthesia Postprocedure Evaluation (Signed)
Anesthesia Post Note  Patient: Juan Horn  Procedure(s) Performed: CATARACT EXTRACTION PHACO AND INTRAOCULAR LENS PLACEMENT ([place]) LEFT (Left Eye)  Patient location during evaluation: PACU Anesthesia Type: MAC Level of consciousness: awake and alert, oriented and patient cooperative Pain management: pain level controlled Vital Signs Assessment: post-procedure vital signs reviewed and stable Respiratory status: spontaneous breathing, nonlabored ventilation and respiratory function stable Cardiovascular status: blood pressure returned to baseline and stable Postop Assessment: adequate PO intake Anesthetic complications: no    Darrin Nipper

## 2019-06-25 ENCOUNTER — Encounter: Payer: Self-pay | Admitting: Ophthalmology

## 2019-09-15 DIAGNOSIS — Z23 Encounter for immunization: Secondary | ICD-10-CM | POA: Diagnosis not present

## 2019-11-28 ENCOUNTER — Other Ambulatory Visit: Payer: Self-pay | Admitting: Internal Medicine

## 2019-11-30 DIAGNOSIS — H353221 Exudative age-related macular degeneration, left eye, with active choroidal neovascularization: Secondary | ICD-10-CM | POA: Diagnosis not present

## 2019-12-09 DIAGNOSIS — E785 Hyperlipidemia, unspecified: Secondary | ICD-10-CM | POA: Diagnosis not present

## 2019-12-09 DIAGNOSIS — I872 Venous insufficiency (chronic) (peripheral): Secondary | ICD-10-CM | POA: Diagnosis not present

## 2019-12-09 DIAGNOSIS — R001 Bradycardia, unspecified: Secondary | ICD-10-CM | POA: Diagnosis not present

## 2019-12-09 DIAGNOSIS — I493 Ventricular premature depolarization: Secondary | ICD-10-CM | POA: Diagnosis not present

## 2019-12-09 DIAGNOSIS — Z86718 Personal history of other venous thrombosis and embolism: Secondary | ICD-10-CM | POA: Diagnosis not present

## 2019-12-14 DIAGNOSIS — H353212 Exudative age-related macular degeneration, right eye, with inactive choroidal neovascularization: Secondary | ICD-10-CM | POA: Diagnosis not present

## 2019-12-14 DIAGNOSIS — H353221 Exudative age-related macular degeneration, left eye, with active choroidal neovascularization: Secondary | ICD-10-CM | POA: Diagnosis not present

## 2019-12-15 DIAGNOSIS — Z23 Encounter for immunization: Secondary | ICD-10-CM | POA: Diagnosis not present

## 2019-12-31 DIAGNOSIS — Z96652 Presence of left artificial knee joint: Secondary | ICD-10-CM | POA: Diagnosis not present

## 2019-12-31 DIAGNOSIS — M1711 Unilateral primary osteoarthritis, right knee: Secondary | ICD-10-CM | POA: Diagnosis not present

## 2020-01-04 DIAGNOSIS — Z1283 Encounter for screening for malignant neoplasm of skin: Secondary | ICD-10-CM | POA: Diagnosis not present

## 2020-01-04 DIAGNOSIS — L821 Other seborrheic keratosis: Secondary | ICD-10-CM | POA: Diagnosis not present

## 2020-01-04 DIAGNOSIS — L738 Other specified follicular disorders: Secondary | ICD-10-CM | POA: Diagnosis not present

## 2020-01-04 DIAGNOSIS — L219 Seborrheic dermatitis, unspecified: Secondary | ICD-10-CM | POA: Diagnosis not present

## 2020-01-04 DIAGNOSIS — L570 Actinic keratosis: Secondary | ICD-10-CM | POA: Diagnosis not present

## 2020-01-04 DIAGNOSIS — Z85828 Personal history of other malignant neoplasm of skin: Secondary | ICD-10-CM | POA: Diagnosis not present

## 2020-01-04 DIAGNOSIS — D1800 Hemangioma unspecified site: Secondary | ICD-10-CM | POA: Diagnosis not present

## 2020-01-04 DIAGNOSIS — L820 Inflamed seborrheic keratosis: Secondary | ICD-10-CM | POA: Diagnosis not present

## 2020-01-04 DIAGNOSIS — L853 Xerosis cutis: Secondary | ICD-10-CM | POA: Diagnosis not present

## 2020-01-08 DIAGNOSIS — M1711 Unilateral primary osteoarthritis, right knee: Secondary | ICD-10-CM | POA: Diagnosis not present

## 2020-01-12 DIAGNOSIS — Z23 Encounter for immunization: Secondary | ICD-10-CM | POA: Diagnosis not present

## 2020-01-15 DIAGNOSIS — M1711 Unilateral primary osteoarthritis, right knee: Secondary | ICD-10-CM | POA: Diagnosis not present

## 2020-01-18 DIAGNOSIS — H353221 Exudative age-related macular degeneration, left eye, with active choroidal neovascularization: Secondary | ICD-10-CM | POA: Diagnosis not present

## 2020-01-22 DIAGNOSIS — M1711 Unilateral primary osteoarthritis, right knee: Secondary | ICD-10-CM | POA: Diagnosis not present

## 2020-02-22 DIAGNOSIS — H353221 Exudative age-related macular degeneration, left eye, with active choroidal neovascularization: Secondary | ICD-10-CM | POA: Diagnosis not present

## 2020-03-15 DIAGNOSIS — M1711 Unilateral primary osteoarthritis, right knee: Secondary | ICD-10-CM | POA: Diagnosis not present

## 2020-03-16 DIAGNOSIS — M1711 Unilateral primary osteoarthritis, right knee: Secondary | ICD-10-CM | POA: Diagnosis not present

## 2020-03-24 DIAGNOSIS — M1711 Unilateral primary osteoarthritis, right knee: Secondary | ICD-10-CM | POA: Diagnosis not present

## 2020-04-11 DIAGNOSIS — H353221 Exudative age-related macular degeneration, left eye, with active choroidal neovascularization: Secondary | ICD-10-CM | POA: Diagnosis not present

## 2020-04-11 NOTE — Telephone Encounter (Signed)
Please fax this per the prior note

## 2020-04-27 DIAGNOSIS — M1711 Unilateral primary osteoarthritis, right knee: Secondary | ICD-10-CM | POA: Diagnosis not present

## 2020-05-17 ENCOUNTER — Other Ambulatory Visit: Payer: Self-pay

## 2020-05-17 ENCOUNTER — Ambulatory Visit (INDEPENDENT_AMBULATORY_CARE_PROVIDER_SITE_OTHER): Payer: Medicare Other | Admitting: Internal Medicine

## 2020-05-17 ENCOUNTER — Encounter: Payer: Self-pay | Admitting: Internal Medicine

## 2020-05-17 VITALS — BP 118/54 | HR 54 | Temp 98.0°F | Ht 67.13 in | Wt 184.0 lb

## 2020-05-17 DIAGNOSIS — N1831 Chronic kidney disease, stage 3a: Secondary | ICD-10-CM

## 2020-05-17 DIAGNOSIS — M48062 Spinal stenosis, lumbar region with neurogenic claudication: Secondary | ICD-10-CM

## 2020-05-17 DIAGNOSIS — F3342 Major depressive disorder, recurrent, in full remission: Secondary | ICD-10-CM

## 2020-05-17 DIAGNOSIS — Z0000 Encounter for general adult medical examination without abnormal findings: Secondary | ICD-10-CM | POA: Diagnosis not present

## 2020-05-17 DIAGNOSIS — K219 Gastro-esophageal reflux disease without esophagitis: Secondary | ICD-10-CM | POA: Diagnosis not present

## 2020-05-17 DIAGNOSIS — E785 Hyperlipidemia, unspecified: Secondary | ICD-10-CM

## 2020-05-17 DIAGNOSIS — Z7189 Other specified counseling: Secondary | ICD-10-CM

## 2020-05-17 LAB — LIPID PANEL
Cholesterol: 144 mg/dL (ref 0–200)
HDL: 43.4 mg/dL (ref >39.00)
NonHDL: 100.72
Total CHOL/HDL Ratio: 3
Triglycerides: 209 mg/dL — ABNORMAL HIGH (ref 0.0–149.0)
VLDL: 41.8 mg/dL — ABNORMAL HIGH (ref 0.0–40.0)

## 2020-05-17 LAB — CBC
HCT: 40.8 % (ref 39.0–52.0)
Hemoglobin: 13.8 g/dL (ref 13.0–17.0)
MCHC: 33.9 g/dL (ref 30.0–36.0)
MCV: 95.9 fl (ref 78.0–100.0)
Platelets: 167 10*3/uL (ref 150.0–400.0)
RBC: 4.26 Mil/uL (ref 4.22–5.81)
RDW: 13.3 % (ref 11.5–15.5)
WBC: 7.4 10*3/uL (ref 4.0–10.5)

## 2020-05-17 LAB — HEPATIC FUNCTION PANEL
ALT: 31 U/L (ref 0–53)
AST: 20 U/L (ref 0–37)
Albumin: 4 g/dL (ref 3.5–5.2)
Alkaline Phosphatase: 74 U/L (ref 39–117)
Bilirubin, Direct: 0.1 mg/dL (ref 0.0–0.3)
Total Bilirubin: 0.6 mg/dL (ref 0.2–1.2)
Total Protein: 7.1 g/dL (ref 6.0–8.3)

## 2020-05-17 LAB — RENAL FUNCTION PANEL
Albumin: 4 g/dL (ref 3.5–5.2)
BUN: 27 mg/dL — ABNORMAL HIGH (ref 6–23)
CO2: 32 mEq/L (ref 19–32)
Calcium: 9.2 mg/dL (ref 8.4–10.5)
Chloride: 104 mEq/L (ref 96–112)
Creatinine, Ser: 1.35 mg/dL (ref 0.40–1.50)
GFR: 50.22 mL/min — ABNORMAL LOW (ref >60.00)
Glucose, Bld: 97 mg/dL (ref 70–99)
Phosphorus: 3.9 mg/dL (ref 2.3–4.6)
Potassium: 4.4 mEq/L (ref 3.5–5.1)
Sodium: 140 mEq/L (ref 135–145)

## 2020-05-17 LAB — LDL CHOLESTEROL, DIRECT: Direct LDL: 90 mg/dL

## 2020-05-17 NOTE — Assessment & Plan Note (Signed)
Has DNR 

## 2020-05-17 NOTE — Assessment & Plan Note (Signed)
Multiple lifetime episodes Now in remission Continue the sertraline

## 2020-05-17 NOTE — Assessment & Plan Note (Signed)
No problems with primary prevention 

## 2020-05-17 NOTE — Assessment & Plan Note (Signed)
Okay on PPI

## 2020-05-17 NOTE — Assessment & Plan Note (Signed)
Very limiting Stable pseudoclaudication

## 2020-05-17 NOTE — Assessment & Plan Note (Signed)
I have personally reviewed the Medicare Annual Wellness questionnaire and have noted 1. The patient's medical and social history 2. Their use of alcohol, tobacco or illicit drugs 3. Their current medications and supplements 4. The patient's functional ability including ADL's, fall risks, home safety risks and hearing or visual             impairment. 5. Diet and physical activities 6. Evidence for depression or mood disorders  The patients weight, height, BMI and visual acuity have been recorded in the chart I have made referrals, counseling and provided education to the patient based review of the above and I have provided the pt with a written personalized care plan for preventive services.  I have provided you with a copy of your personalized plan for preventive services. Please take the time to review along with your updated medication list.  Flu vaccine in the fall No cancer screening due to age Discussed fitness--mostly leg strengthening due to his limitations Got both shingrix and COVID

## 2020-05-17 NOTE — Progress Notes (Signed)
Subjective:    Patient ID: Juan Horn, male    DOB: 06/04/1933, 86 y.o.   MRN: [id number]  HPI Here for Medicare wellness visit and follow up of chronic health conditions This visit occurred during the SARS-CoV-2 public health emergency.  Safety protocols were in place, including screening questions prior to the visit, additional usage of staff PPE, and extensive cleaning of exam room while observing appropriate contact time as indicated for disinfecting solutions.   Reviewed form and  advanced directives Reviewed other doctors Will be restarting at gym at [facility] of wine or beer daily No tobacco No sig vision in right eye. Left is fine Hearing aides--uses only sporadically Did fall going to the bathroom at night---no major injury.  Independent with instrumental ADLs No sig memory issues  Having pain in right knee Had nerve ablation but it didn't help Normally has to walk with a cane--and got scooter  Still limited by walking--stiff and pain in legs from the spinal stenosis Uses ibuprofen rarely (600mg  once a week)  Still on omeprazole daily No heartburn or dysphagia on this  Has had recurrent major depression Now in remission Stays on the sertraline  Continues on statin No problems No chest pain or SOB No dizziness or syncope  GFR in 50's  Current Outpatient Medications on File Prior to Visit  Medication Sig Dispense Refill  . Ascorbic Acid (VITAMIN C PO) Take by mouth daily.    . Cholecalciferol (VITAMIN D3 PO) Take 1,000 Units by mouth daily.    . hydrocortisone 2.5 % cream Apply topically 2 (two) times daily as needed.    . hydroxypropyl methylcellulose / hypromellose (ISOPTO TEARS / GONIOVISC) 2.5 % ophthalmic solution 1 drop as needed for dry eyes.    Marland Kitchen ibuprofen (ADVIL,MOTRIN) 200 MG tablet Take 600 mg by mouth daily as needed.    . latanoprost (XALATAN) 0.005 % ophthalmic solution Place 1 drop into both eyes at bedtime.    . Multiple  Vitamins-Minerals (PRESERVISION AREDS 2 PO) Take 2 tablets by mouth daily.     Marland Kitchen omeprazole (PRILOSEC) 20 MG capsule TAKE 1 CAPSULE (20 MG TOTAL) BY MOUTH 2 (TWO) TIMES DAILY BEFORE A MEAL. 180 capsule 1  . pravastatin (PRAVACHOL) 20 MG tablet TAKE 1 TABLET DAILY 90 tablet 3  . sertraline (ZOLOFT) 50 MG tablet TAKE 1 TABLET DAILY 90 tablet 3   No current facility-administered medications on file prior to visit.    Allergies  Allergen Reactions  . Naphazoline-Polyethyl Glycol Other (See Comments)    (Afgan) redness    Past Medical History:  Diagnosis Date  . Anxiety   . Cancer [facility]) 2010   Prostate Cancer  . Cataract    left eye  . Chronic renal disease, stage III   . Chronic venous insufficiency   . Clotting disorder ([place]) 2013   blood clot 3 days post knee surgery  . Diverticulosis of colon   . DVT (deep venous thrombosis) ([place]) 2012   after knee replacement  . Glaucoma   . HLD (hyperlipidemia)   . Hx of colonic polyp   . Macular degeneration    legally blind in right eye  . OA (osteoarthritis)   . Personal history of prostate cancer   . Pulmonary embolism ([place]) 11/13   post op TKR  . PVC (premature ventricular contraction)   . Spinal stenosis of lumbar region     Past Surgical History:  Procedure Laterality Date  . APPENDECTOMY    .  CATARACT EXTRACTION W/PHACO Left 06/24/2019   Procedure: CATARACT EXTRACTION PHACO AND INTRAOCULAR LENS PLACEMENT ([place]) LEFT;  Surgeon: Leandrew Koyanagi, MD;  Location: [facility];  Service: Ophthalmology;  Laterality: Left;  . COLONOSCOPY  2011  . INGUINAL HERNIA REPAIR     left  . INGUINAL HERNIA REPAIR  5/12   Dr Priscille Heidelberg  . INGUINAL HERNIA REPAIR  5/12   Dr Jamal Collin did redo of this  . JOINT REPLACEMENT  11/13   Left total knee--Dr Hooten  . KNEE SURGERY  2013  . POLYPECTOMY  2011  . PROSTATECTOMY  2010  . TONSILLECTOMY    . VARICOSE VEIN SURGERY     left    Family History  Problem Relation Age of Onset    . Stroke Father   . Dementia Mother   . Leukemia Sister   . Colon cancer Neg Hx     Social History   Socioeconomic History  . Marital status: Married    Spouse name: Not on file  . Number of children: 0  . Years of education: Not on file  . Highest education level: Not on file  Occupational History  . Occupation: Retired-purchasing for [employer]  Tobacco Use  . Smoking status: Former Smoker    Types: Cigarettes    Quit date: 12/03/1978    Years since quitting: 41.4  . Smokeless tobacco: Never Used  Vaping Use  . Vaping Use: Never used  Substance and Sexual Activity  . Alcohol use: Yes    Alcohol/week: 7.0 standard drinks    Types: 7 Glasses of wine per week  . Drug use: No  . Sexual activity: Not on file  Other Topics Concern  . Not on file  Social History Narrative   Has living will   DNR done 11/12   Wife is health care POA--then niece Evangeline Dakin or niece Janalyn Shy   No feeding tube if cognitively unaware   Social Determinants of Health   Financial Resource Strain:   . Difficulty of Paying Living Expenses:   Food Insecurity:   . Worried About Charity fundraiser in the Last Year:   . Arboriculturist in the Last Year:   Transportation Needs:   . Film/video editor (Medical):   Marland Kitchen Lack of Transportation (Non-Medical):   Physical Activity:   . Days of Exercise per Week:   . Minutes of Exercise per Session:   Stress:   . Feeling of Stress :   Social Connections:   . Frequency of Communication with Friends and Family:   . Frequency of Social Gatherings with Friends and Family:   . Attends Religious Services:   . Active Member of Clubs or Organizations:   . Attends Archivist Meetings:   Marland Kitchen Marital Status:   Intimate Partner Violence:   . Fear of Current or Ex-Partner:   . Emotionally Abused:   Marland Kitchen Physically Abused:   . Sexually Abused:    Review of Systems Appetite is fine Weight is stable Sleeps okay Wears seat belt Teeth  good--keeps up with dentist Bowels fine. No blood. Mild constipation at times--doesn't need meds Voids okay---weak stream but not a problem    Objective:   Physical Exam  Constitutional: No distress.  HENT:  Mouth/Throat: Mucous membranes are moist.  Cardiovascular: Normal rate and regular rhythm. Exam reveals no gallop.  No murmur heard. Bigeminy at times Faint pedal pulses  Respiratory: Effort normal and breath sounds normal. He has no  wheezes. He has no rales.  GI: Soft. Normal appearance. There is no abdominal tenderness.  Musculoskeletal:     Cervical back: Neck supple.     Comments: Trace edema in just ankles  Lymphadenopathy:    He has no cervical adenopathy.  Neurological: He is alert.  President-- "Jonna Munro, Trump, Obama" [phone] D-l-o-r-d Recall 3/3  Skin: Skin is warm. No rash noted.  Psychiatric: His behavior is normal. Mood normal.           Assessment & Plan:

## 2020-05-17 NOTE — Assessment & Plan Note (Signed)
Will recheck

## 2020-05-31 DIAGNOSIS — H401134 Primary open-angle glaucoma, bilateral, indeterminate stage: Secondary | ICD-10-CM | POA: Diagnosis not present

## 2020-06-02 ENCOUNTER — Other Ambulatory Visit: Payer: Self-pay | Admitting: Internal Medicine

## 2020-06-02 DIAGNOSIS — M1711 Unilateral primary osteoarthritis, right knee: Secondary | ICD-10-CM | POA: Diagnosis not present

## 2020-06-02 DIAGNOSIS — M25561 Pain in right knee: Secondary | ICD-10-CM | POA: Diagnosis not present

## 2020-06-13 DIAGNOSIS — H353221 Exudative age-related macular degeneration, left eye, with active choroidal neovascularization: Secondary | ICD-10-CM | POA: Diagnosis not present

## 2020-08-29 DIAGNOSIS — H353221 Exudative age-related macular degeneration, left eye, with active choroidal neovascularization: Secondary | ICD-10-CM | POA: Diagnosis not present

## 2020-09-20 DIAGNOSIS — Z23 Encounter for immunization: Secondary | ICD-10-CM | POA: Diagnosis not present

## 2020-10-05 NOTE — Telephone Encounter (Signed)
Please fax the written Rx to this number

## 2020-10-31 ENCOUNTER — Ambulatory Visit (INDEPENDENT_AMBULATORY_CARE_PROVIDER_SITE_OTHER): Payer: Medicare Other | Admitting: Podiatry

## 2020-10-31 ENCOUNTER — Encounter: Payer: Self-pay | Admitting: Podiatry

## 2020-10-31 ENCOUNTER — Other Ambulatory Visit: Payer: Self-pay

## 2020-10-31 DIAGNOSIS — I872 Venous insufficiency (chronic) (peripheral): Secondary | ICD-10-CM

## 2020-10-31 DIAGNOSIS — N183 Chronic kidney disease, stage 3 unspecified: Secondary | ICD-10-CM | POA: Diagnosis not present

## 2020-10-31 DIAGNOSIS — M79675 Pain in left toe(s): Secondary | ICD-10-CM

## 2020-10-31 DIAGNOSIS — B351 Tinea unguium: Secondary | ICD-10-CM | POA: Insufficient documentation

## 2020-10-31 DIAGNOSIS — M79674 Pain in right toe(s): Secondary | ICD-10-CM

## 2020-10-31 NOTE — Progress Notes (Signed)
This patient returns to my office for at risk foot care.  This patient requires this care by a professional since this patient will be at risk due to having chronic kidney disease and chronic venous insufficiency.  This patient is unable to cut nails himself since the patient cannot reach his nails.These nails are painful walking and wearing shoes.  This patient presents for at risk foot care today.  General Appearance  Alert, conversant and in no acute stress.  Vascular  Dorsalis pedis and posterior tibial pulses  are palpable  Right.  Dorsalis pedis and posterior tibial pulses are absent left foot..  Capillary return is within normal limits  bilaterally. Temperature is within normal limits  Bilaterally. Absent digital hair B/L.  Neurologic  Senn-Weinstein monofilament wire test within normal limits  bilaterally. Muscle power within normal limits bilaterally.  Nails Thick disfigured discolored nails with subungual debris  from hallux to fifth toes bilaterally. No evidence of bacterial infection or drainage bilaterally.  Orthopedic  No limitations of motion  feet .  No crepitus or effusions noted.  No bony pathology or digital deformities noted.  Skin  normotropic skin with no porokeratosis noted bilaterally.  No signs of infections or ulcers noted.     Onychomycosis  Pain in right toes  Pain in left toes  Consent was obtained for treatment procedures.   Mechanical debridement of nails 1-5  bilaterally performed with a nail nipper.  Filed with dremel without incident.    Return office visit    3 months                  Told patient to return for periodic foot care and evaluation due to potential at risk complications.   Gardiner Barefoot DPM

## 2020-11-04 ENCOUNTER — Other Ambulatory Visit: Payer: Self-pay | Admitting: Internal Medicine

## 2020-11-07 ENCOUNTER — Other Ambulatory Visit: Payer: Self-pay

## 2020-11-07 ENCOUNTER — Encounter: Payer: Self-pay | Admitting: Dermatology

## 2020-11-07 ENCOUNTER — Ambulatory Visit (INDEPENDENT_AMBULATORY_CARE_PROVIDER_SITE_OTHER): Payer: Medicare Other | Admitting: Dermatology

## 2020-11-07 DIAGNOSIS — L570 Actinic keratosis: Secondary | ICD-10-CM | POA: Diagnosis not present

## 2020-11-07 DIAGNOSIS — L821 Other seborrheic keratosis: Secondary | ICD-10-CM | POA: Diagnosis not present

## 2020-11-07 DIAGNOSIS — L820 Inflamed seborrheic keratosis: Secondary | ICD-10-CM | POA: Diagnosis not present

## 2020-11-07 DIAGNOSIS — L738 Other specified follicular disorders: Secondary | ICD-10-CM

## 2020-11-07 DIAGNOSIS — L578 Other skin changes due to chronic exposure to nonionizing radiation: Secondary | ICD-10-CM | POA: Diagnosis not present

## 2020-11-07 DIAGNOSIS — Z85828 Personal history of other malignant neoplasm of skin: Secondary | ICD-10-CM

## 2020-11-07 NOTE — Patient Instructions (Signed)

## 2020-11-07 NOTE — Progress Notes (Signed)
   Follow-Up Visit   Subjective  Juan Horn is a 86 y.o. male who presents for the following: growths (face, not sure how long they have been there, he picks at spot behind ear).  Wife is concerned and wants them checked.   The following portions of the chart were reviewed this encounter and updated as appropriate:      Review of Systems:  No other skin or systemic complaints except as noted in HPI or Assessment and Plan.  Objective  Well appearing patient in no apparent distress; mood and affect are within normal limits.  A focused examination was performed including face. Relevant physical exam findings are noted in the Assessment and Plan.  Objective  L forehead, R lat eyebrow: Stuck-on, waxy, tan-brown papules and plaques -- Discussed benign etiology and prognosis.   Objective  R paranasal: Yellow lobulated papule 19mm  Objective  L cheek x 1, L posterior earlobe x 1 (2): Erythematous keratotic or waxy stuck-on papule    Objective  Left Ear helix x 1, L cheekx  2 (3): Pink scaly macules    Assessment & Plan    History of Basal Cell Carcinoma of the Skin - No evidence of recurrence today - Recommend regular full body skin exams - Recommend daily broad spectrum sunscreen SPF 30+ to sun-exposed areas, reapply every 2 hours as needed.  - Call if any new or changing lesions are noted between office visits  History of Squamous Cell Carcinoma of the Skin - No evidence of recurrence today - Recommend regular full body skin exams - Recommend daily broad spectrum sunscreen SPF 30+ to sun-exposed areas, reapply every 2 hours as needed.  - Call if any new or changing lesions are noted between office visits  Actinic Damage - chronic, secondary to cumulative UV radiation exposure/sun exposure over time - diffuse scaly erythematous macules with underlying dyspigmentation - Recommend daily broad spectrum sunscreen SPF 30+ to sun-exposed areas, reapply every 2  hours as needed.  - Call for new or changing lesions.  Seborrheic keratosis L forehead, R lat eyebrow  Reassured benign age-related growth.  Recommend observation.  Discussed cryotherapy if spot(s) become irritated or inflamed.   Sebaceous hyperplasia R paranasal  Benign, observe  Inflamed seborrheic keratosis (2) L cheek x 1, L posterior earlobe x 1  Destruction of lesion - L cheek x 1, L posterior earlobe x 1  Destruction method: cryotherapy   Informed consent: discussed and consent obtained   Lesion destroyed using liquid nitrogen: Yes   Region frozen until ice ball extended beyond lesion: Yes   Outcome: patient tolerated procedure well with no complications   Post-procedure details: wound care instructions given    AK (actinic keratosis) (3) Left Ear helix x 1, L cheekx  2  Destruction of lesion - Left Ear helix x 1, L cheekx  2  Destruction method: cryotherapy   Informed consent: discussed and consent obtained   Lesion destroyed using liquid nitrogen: Yes   Region frozen until ice ball extended beyond lesion: Yes   Outcome: patient tolerated procedure well with no complications   Post-procedure details: wound care instructions given    Return for as scheduled for UBSE.   I, Othelia Pulling, RMA, am acting as scribe for Brendolyn Patty, MD .  Documentation: I have reviewed the above documentation for accuracy and completeness, and I agree with the above.  Brendolyn Patty MD

## 2020-11-28 DIAGNOSIS — H353221 Exudative age-related macular degeneration, left eye, with active choroidal neovascularization: Secondary | ICD-10-CM | POA: Diagnosis not present

## 2020-11-29 DIAGNOSIS — H401134 Primary open-angle glaucoma, bilateral, indeterminate stage: Secondary | ICD-10-CM | POA: Diagnosis not present

## 2020-12-07 DIAGNOSIS — I493 Ventricular premature depolarization: Secondary | ICD-10-CM | POA: Diagnosis not present

## 2020-12-07 DIAGNOSIS — E785 Hyperlipidemia, unspecified: Secondary | ICD-10-CM | POA: Diagnosis not present

## 2020-12-07 DIAGNOSIS — Z23 Encounter for immunization: Secondary | ICD-10-CM | POA: Diagnosis not present

## 2021-01-09 ENCOUNTER — Encounter: Payer: Medicare Other | Admitting: Dermatology

## 2021-01-30 ENCOUNTER — Other Ambulatory Visit: Payer: Self-pay

## 2021-01-30 ENCOUNTER — Encounter: Payer: Self-pay | Admitting: Podiatry

## 2021-01-30 ENCOUNTER — Ambulatory Visit (INDEPENDENT_AMBULATORY_CARE_PROVIDER_SITE_OTHER): Payer: Medicare Other | Admitting: Podiatry

## 2021-01-30 DIAGNOSIS — M79674 Pain in right toe(s): Secondary | ICD-10-CM

## 2021-01-30 DIAGNOSIS — M79675 Pain in left toe(s): Secondary | ICD-10-CM

## 2021-01-30 DIAGNOSIS — B351 Tinea unguium: Secondary | ICD-10-CM

## 2021-01-30 DIAGNOSIS — N183 Chronic kidney disease, stage 3 unspecified: Secondary | ICD-10-CM | POA: Diagnosis not present

## 2021-01-30 DIAGNOSIS — I872 Venous insufficiency (chronic) (peripheral): Secondary | ICD-10-CM | POA: Diagnosis not present

## 2021-01-30 NOTE — Progress Notes (Signed)
This patient returns to my office for at risk foot care.  This patient requires this care by a professional since this patient will be at risk due to having chronic kidney disease and chronic venous insufficiency.  This patient is unable to cut nails himself since the patient cannot reach his nails.These nails are painful walking and wearing shoes.  This patient presents for at risk foot care today.  General Appearance  Alert, conversant and in no acute stress.  Vascular  Dorsalis pedis and posterior tibial pulses  are palpable  Right.  Dorsalis pedis and posterior tibial pulses are absent left foot..  Capillary return is within normal limits  bilaterally. Temperature is within normal limits  Bilaterally. Absent digital hair B/L.  Neurologic  Senn-Weinstein monofilament wire test within normal limits  bilaterally. Muscle power within normal limits bilaterally.  Nails Thick disfigured discolored nails with subungual debris  from hallux to fifth toes bilaterally. No evidence of bacterial infection or drainage bilaterally.  Orthopedic  No limitations of motion  feet .  No crepitus or effusions noted.  No bony pathology or digital deformities noted.  Skin  normotropic skin with no porokeratosis noted bilaterally.  No signs of infections or ulcers noted.     Onychomycosis  Pain in right toes  Pain in left toes  Consent was obtained for treatment procedures.   Mechanical debridement of nails 1-5  bilaterally performed with a nail nipper.  Filed with dremel without incident.    Return office visit    3 months                  Told patient to return for periodic foot care and evaluation due to potential at risk complications.   Gardiner Barefoot DPM

## 2021-02-21 DIAGNOSIS — H353212 Exudative age-related macular degeneration, right eye, with inactive choroidal neovascularization: Secondary | ICD-10-CM | POA: Diagnosis not present

## 2021-03-21 DIAGNOSIS — M1711 Unilateral primary osteoarthritis, right knee: Secondary | ICD-10-CM | POA: Diagnosis not present

## 2021-04-04 ENCOUNTER — Ambulatory Visit: Payer: Medicare Other | Admitting: Dermatology

## 2021-05-04 ENCOUNTER — Other Ambulatory Visit: Payer: Self-pay

## 2021-05-04 ENCOUNTER — Ambulatory Visit (INDEPENDENT_AMBULATORY_CARE_PROVIDER_SITE_OTHER): Payer: Medicare Other | Admitting: Podiatry

## 2021-05-04 ENCOUNTER — Encounter: Payer: Self-pay | Admitting: Podiatry

## 2021-05-04 DIAGNOSIS — I872 Venous insufficiency (chronic) (peripheral): Secondary | ICD-10-CM

## 2021-05-04 DIAGNOSIS — M79675 Pain in left toe(s): Secondary | ICD-10-CM | POA: Diagnosis not present

## 2021-05-04 DIAGNOSIS — M79674 Pain in right toe(s): Secondary | ICD-10-CM | POA: Diagnosis not present

## 2021-05-04 DIAGNOSIS — N183 Chronic kidney disease, stage 3 unspecified: Secondary | ICD-10-CM | POA: Diagnosis not present

## 2021-05-04 DIAGNOSIS — B351 Tinea unguium: Secondary | ICD-10-CM

## 2021-05-04 NOTE — Progress Notes (Signed)
This patient returns to my office for at risk foot care.  This patient requires this care by a professional since this patient will be at risk due to having chronic kidney disease and chronic venous insufficiency.  This patient is unable to cut nails himself since the patient cannot reach his nails.These nails are painful walking and wearing shoes.  This patient presents for at risk foot care today.  General Appearance  Alert, conversant and in no acute stress.  Vascular  Dorsalis pedis and posterior tibial pulses  are palpable  Right.  Dorsalis pedis and posterior tibial pulses are absent left foot..  Capillary return is within normal limits  bilaterally. Temperature is within normal limits  Bilaterally. Absent digital hair B/L.  Neurologic  Senn-Weinstein monofilament wire test within normal limits  bilaterally. Muscle power within normal limits bilaterally.  Nails Thick disfigured discolored nails with subungual debris  from hallux to fifth toes bilaterally. No evidence of bacterial infection or drainage bilaterally.  Orthopedic  No limitations of motion  feet .  No crepitus or effusions noted.  No bony pathology or digital deformities noted.  Skin  normotropic skin with no porokeratosis noted bilaterally.  No signs of infections or ulcers noted.     Onychomycosis  Pain in right toes  Pain in left toes  Consent was obtained for treatment procedures.   Mechanical debridement of nails 1-5  bilaterally performed with a nail nipper.  Filed with dremel without incident.    Return office visit    3 months                  Told patient to return for periodic foot care and evaluation due to potential at risk complications.   Gardiner Barefoot DPM

## 2021-05-19 ENCOUNTER — Other Ambulatory Visit: Payer: Self-pay

## 2021-05-19 ENCOUNTER — Ambulatory Visit (INDEPENDENT_AMBULATORY_CARE_PROVIDER_SITE_OTHER): Payer: Medicare Other | Admitting: Internal Medicine

## 2021-05-19 ENCOUNTER — Encounter: Payer: Self-pay | Admitting: Internal Medicine

## 2021-05-19 VITALS — BP 118/68 | HR 75 | Temp 97.7°F | Ht 67.75 in | Wt 185.0 lb

## 2021-05-19 DIAGNOSIS — E785 Hyperlipidemia, unspecified: Secondary | ICD-10-CM

## 2021-05-19 DIAGNOSIS — Z0000 Encounter for general adult medical examination without abnormal findings: Secondary | ICD-10-CM | POA: Diagnosis not present

## 2021-05-19 DIAGNOSIS — N1831 Chronic kidney disease, stage 3a: Secondary | ICD-10-CM | POA: Diagnosis not present

## 2021-05-19 DIAGNOSIS — F3342 Major depressive disorder, recurrent, in full remission: Secondary | ICD-10-CM | POA: Diagnosis not present

## 2021-05-19 DIAGNOSIS — Z7189 Other specified counseling: Secondary | ICD-10-CM

## 2021-05-19 DIAGNOSIS — M48062 Spinal stenosis, lumbar region with neurogenic claudication: Secondary | ICD-10-CM

## 2021-05-19 LAB — LIPID PANEL
Cholesterol: 157 mg/dL (ref 0–200)
HDL: 45.7 mg/dL (ref >39.00)
LDL Cholesterol: 73 mg/dL (ref 0–99)
NonHDL: 111.2
Total CHOL/HDL Ratio: 3
Triglycerides: 190 mg/dL — ABNORMAL HIGH (ref 0.0–149.0)
VLDL: 38 mg/dL (ref 0.0–40.0)

## 2021-05-19 LAB — HEPATIC FUNCTION PANEL
ALT: 32 U/L (ref 0–53)
AST: 19 U/L (ref 0–37)
Albumin: 4 g/dL (ref 3.5–5.2)
Alkaline Phosphatase: 81 U/L (ref 39–117)
Bilirubin, Direct: 0.1 mg/dL (ref 0.0–0.3)
Total Bilirubin: 0.6 mg/dL (ref 0.2–1.2)
Total Protein: 7.1 g/dL (ref 6.0–8.3)

## 2021-05-19 LAB — RENAL FUNCTION PANEL
Albumin: 4 g/dL (ref 3.5–5.2)
BUN: 24 mg/dL — ABNORMAL HIGH (ref 6–23)
CO2: 30 meq/L (ref 19–32)
Calcium: 9.3 mg/dL (ref 8.4–10.5)
Chloride: 104 meq/L (ref 96–112)
Creatinine, Ser: 1.18 mg/dL (ref 0.40–1.50)
GFR: 56.05 mL/min — ABNORMAL LOW
Glucose, Bld: 71 mg/dL (ref 70–99)
Phosphorus: 3.8 mg/dL (ref 2.3–4.6)
Potassium: 4.6 meq/L (ref 3.5–5.1)
Sodium: 141 meq/L (ref 135–145)

## 2021-05-19 LAB — CBC
HCT: 39.4 % (ref 39.0–52.0)
Hemoglobin: 13 g/dL (ref 13.0–17.0)
MCHC: 32.9 g/dL (ref 30.0–36.0)
MCV: 85 fl (ref 78.0–100.0)
Platelets: 185 K/uL (ref 150.0–400.0)
RBC: 4.64 Mil/uL (ref 4.22–5.81)
RDW: 17.2 % — ABNORMAL HIGH (ref 11.5–15.5)
WBC: 9.3 K/uL (ref 4.0–10.5)

## 2021-05-19 LAB — T4, FREE: Free T4: 0.83 ng/dL (ref 0.60–1.60)

## 2021-05-19 MED ORDER — OMEPRAZOLE 20 MG PO CPDR: 20.0000 mg | DELAYED_RELEASE_CAPSULE | Freq: Every day | ORAL | 3 refills | Status: DC

## 2021-05-19 NOTE — Assessment & Plan Note (Signed)
No problems on the pravachol

## 2021-05-19 NOTE — Assessment & Plan Note (Signed)
Will continue sertraline indefinitely

## 2021-05-19 NOTE — Assessment & Plan Note (Signed)
I have personally reviewed the Medicare Annual Wellness questionnaire and have noted 1. The patient's medical and social history 2. Their use of alcohol, tobacco or illicit drugs 3. Their current medications and supplements 4. The patient's functional ability including ADL's, fall risks, home safety risks and hearing or visual             impairment. 5. Diet and physical activities 6. Evidence for depression or mood disorders  The patients weight, height, BMI and visual acuity have been recorded in the chart I have made referrals, counseling and provided education to the patient based review of the above and I have provided the pt with a written personalized care plan for preventive services.  I have provided you with a copy of your personalized plan for preventive services. Please take the time to review along with your updated medication list.  No cancer screening due to age Needs flu vaccine in the fall Discussed exercise/fitness

## 2021-05-19 NOTE — Assessment & Plan Note (Signed)
>>  ASSESSMENT AND PLAN FOR STAGE 3A CHRONIC KIDNEY DISEASE (HCC) WRITTEN ON 05/19/2021 11:28 AM BY Jaliel Deavers, CHARLIE FERNS, MD  Will recheck labs No Rx
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
## 2021-05-19 NOTE — Assessment & Plan Note (Signed)
See social history 

## 2021-05-19 NOTE — Assessment & Plan Note (Signed)
And knee pain Discussed regular tylenol and ibuprofen prn

## 2021-05-19 NOTE — Progress Notes (Signed)
Hearing Screening - Comments:: Has hearing aids. Not wearing them today. Vision Screening - Comments:: December 2021

## 2021-05-19 NOTE — Assessment & Plan Note (Signed)
Will recheck labs No Rx

## 2021-05-29 ENCOUNTER — Other Ambulatory Visit: Payer: Self-pay | Admitting: Internal Medicine

## 2021-05-30 DIAGNOSIS — H353221 Exudative age-related macular degeneration, left eye, with active choroidal neovascularization: Secondary | ICD-10-CM | POA: Diagnosis not present

## 2021-06-01 DIAGNOSIS — H401134 Primary open-angle glaucoma, bilateral, indeterminate stage: Secondary | ICD-10-CM | POA: Diagnosis not present

## 2021-07-24 ENCOUNTER — Other Ambulatory Visit: Payer: Self-pay

## 2021-07-24 ENCOUNTER — Ambulatory Visit (INDEPENDENT_AMBULATORY_CARE_PROVIDER_SITE_OTHER): Payer: Medicare Other | Admitting: Dermatology

## 2021-07-24 DIAGNOSIS — C44319 Basal cell carcinoma of skin of other parts of face: Secondary | ICD-10-CM | POA: Diagnosis not present

## 2021-07-24 DIAGNOSIS — L821 Other seborrheic keratosis: Secondary | ICD-10-CM

## 2021-07-24 DIAGNOSIS — Z1283 Encounter for screening for malignant neoplasm of skin: Secondary | ICD-10-CM

## 2021-07-24 DIAGNOSIS — C4491 Basal cell carcinoma of skin, unspecified: Secondary | ICD-10-CM

## 2021-07-24 DIAGNOSIS — L578 Other skin changes due to chronic exposure to nonionizing radiation: Secondary | ICD-10-CM | POA: Diagnosis not present

## 2021-07-24 DIAGNOSIS — L570 Actinic keratosis: Secondary | ICD-10-CM | POA: Diagnosis not present

## 2021-07-24 DIAGNOSIS — Z85828 Personal history of other malignant neoplasm of skin: Secondary | ICD-10-CM | POA: Diagnosis not present

## 2021-07-24 DIAGNOSIS — D1800 Hemangioma unspecified site: Secondary | ICD-10-CM | POA: Diagnosis not present

## 2021-07-24 DIAGNOSIS — L738 Other specified follicular disorders: Secondary | ICD-10-CM | POA: Diagnosis not present

## 2021-07-24 DIAGNOSIS — L720 Epidermal cyst: Secondary | ICD-10-CM | POA: Diagnosis not present

## 2021-07-24 DIAGNOSIS — C44310 Basal cell carcinoma of skin of unspecified parts of face: Secondary | ICD-10-CM

## 2021-07-24 DIAGNOSIS — D485 Neoplasm of uncertain behavior of skin: Secondary | ICD-10-CM

## 2021-07-24 HISTORY — DX: Basal cell carcinoma of skin, unspecified: C44.91

## 2021-07-24 NOTE — Progress Notes (Signed)
Follow-Up Visit   Subjective  Juan Horn is a 87 y.o. male who presents for the following: Follow-up (Patient presents for UBSE today. He has a history of Aks, BCC, and SCC. No spots of concern today. ).   The following portions of the chart were reviewed this encounter and updated as appropriate:       Review of Systems:  No other skin or systemic complaints except as noted in HPI or Assessment and Plan.  Objective  Well appearing patient in no apparent distress; mood and affect are within normal limits.  All skin waist up examined.  left upper forehead 6.70m pearly papule      R ear helix x 1, R sideburn x 1, L ear helix x 1 (3) Pink scaly macules  L sacrum Subcutaneous nodule.    Assessment & Plan  Skin cancer screening performed today.  Actinic Damage - chronic, secondary to cumulative UV radiation exposure/sun exposure over time - diffuse scaly erythematous macules with underlying dyspigmentation - Recommend daily broad spectrum sunscreen SPF 30+ to sun-exposed areas, reapply every 2 hours as needed.  - Recommend staying in the shade or wearing long sleeves, sun glasses (UVA+UVB protection) and wide brim hats (4-inch brim around the entire circumference of the hat). - Call for new or changing lesions.  History of Basal Cell Carcinoma of the Skin - No evidence of recurrence today of the left nasal tip, right preauricular - Recommend regular full body skin exams - Recommend daily broad spectrum sunscreen SPF 30+ to sun-exposed areas, reapply every 2 hours as needed.  - Call if any new or changing lesions are noted between office visits  History of Squamous Cell Carcinoma of the Skin - No evidence of recurrence today left dorsal forearm near antecubital - Recommend regular full body skin exams - Recommend daily broad spectrum sunscreen SPF 30+ to sun-exposed areas, reapply every 2 hours as needed.  - Call if any new or changing lesions are noted  between office visits  Seborrheic Keratoses - Stuck-on, waxy, tan-brown papules and/or plaques  - Benign-appearing - Discussed benign etiology and prognosis. - Observe - Call for any changes  Hemangiomas - Red papules - Discussed benign nature - Observe - Call for any changes  Sebaceous Hyperplasia - Small yellow papules with a central dell R paranasal - Benign - Observe  Neoplasm of uncertain behavior of skin left upper forehead  Skin / nail biopsy Type of biopsy: tangential   Informed consent: discussed and consent obtained   Patient was prepped and draped in usual sterile fashion: Area prepped with alcohol. Anesthesia: the lesion was anesthetized in a standard fashion   Anesthetic:  1% lidocaine w/ epinephrine 1-100,000 buffered w/ 8.4% NaHCO3 Instrument used: flexible razor blade   Hemostasis achieved with: pressure, aluminum chloride and electrodesiccation   Outcome: patient tolerated procedure well    Destruction of lesion  Destruction method: electrodesiccation and curettage   Informed consent: discussed and consent obtained   Timeout:  patient name, date of birth, surgical site, and procedure verified Curettage performed in three different directions: Yes   Electrodesiccation performed over the curetted area: Yes   Lesion length (cm):  0.6 Lesion width (cm):  0.6 Margin per side (cm):  0.2 Final wound size (cm):  1 Hemostasis achieved with:  pressure, aluminum chloride and electrodesiccation Outcome: patient tolerated procedure well with no complications   Post-procedure details: wound care instructions given   Post-procedure details comment:  Ointment and bandage applied.  Specimen 1 - Surgical pathology Differential Diagnosis: r/o BCC Check Margins: No 6.39m pearly papule EDC today  AK (actinic keratosis) (3) R ear helix x 1, R sideburn x 1, L ear helix x 1  Actinic keratoses are precancerous spots that appear secondary to cumulative UV radiation  exposure/sun exposure over time. They are chronic with expected duration over 1 year. A portion of actinic keratoses will progress to squamous cell carcinoma of the skin. It is not possible to reliably predict which spots will progress to skin cancer and so treatment is recommended to prevent development of skin cancer.  Recommend daily broad spectrum sunscreen SPF 30+ to sun-exposed areas, reapply every 2 hours as needed.  Recommend staying in the shade or wearing long sleeves, sun glasses (UVA+UVB protection) and wide brim hats (4-inch brim around the entire circumference of the hat). Call for new or changing lesions.  Destruction of lesion - R ear helix x 1, R sideburn x 1, L ear helix x 1  Destruction method: cryotherapy   Informed consent: discussed and consent obtained   Lesion destroyed using liquid nitrogen: Yes   Region frozen until ice ball extended beyond lesion: Yes   Outcome: patient tolerated procedure well with no complications   Post-procedure details: wound care instructions given   Additional details:  Prior to procedure, discussed risks of blister formation, small wound, skin dyspigmentation, or rare scar following cryotherapy. Recommend Vaseline ointment to treated areas while healing.   Epidermal inclusion cyst L sacrum  Benign-appearing. Exam most consistent with an epidermal inclusion cyst. Discussed that a cyst is a benign growth that can grow over time and sometimes get irritated or inflamed. Recommend observation if it is not bothersome. Discussed option of surgical excision to remove it if it is growing, symptomatic, or other changes noted. Please call for new or changing lesions so they can be evaluated.    Return in about 6 months (around 01/24/2022) for AKs.  I,Jamesetta Orleans CMA, am acting as scribe for TBrendolyn Patty MD . Documentation: I have reviewed the above documentation for accuracy and completeness, and I agree with the above.  TBrendolyn PattyMD

## 2021-07-24 NOTE — Patient Instructions (Addendum)
Cryotherapy Aftercare  Wash gently with soap and water everyday.   Apply Vaseline and Band-Aid daily until healed.    Wound Care Instructions  Cleanse wound gently with soap and water once a day then pat dry with clean gauze. Apply a thing coat of Petrolatum (petroleum jelly, "Vaseline") over the wound (unless you have an allergy to this). We recommend that you use a new, sterile tube of Vaseline. Do not pick or remove scabs. Do not remove the yellow or white "healing tissue" from the base of the wound.  Cover the wound with fresh, clean, nonstick gauze and secure with paper tape. You may use Band-Aids in place of gauze and tape if the would is small enough, but would recommend trimming much of the tape off as there is often too much. Sometimes Band-Aids can irritate the skin.  You should call the office for your biopsy report after 1 week if you have not already been contacted.  If you experience any problems, such as abnormal amounts of bleeding, swelling, significant bruising, significant pain, or evidence of infection, please call the office immediately.  FOR ADULT SURGERY PATIENTS: If you need something for pain relief you may take 1 extra strength Tylenol (acetaminophen) AND 2 Ibuprofen (200mg each) together every 4 hours as needed for pain. (do not take these if you are allergic to them or if you have a reason you should not take them.) Typically, you may only need pain medication for 1 to 3 days.     If you have any questions or concerns for your doctor, please call our main line at [phone] and press option 4 to reach your doctor's medical assistant. If no one answers, please leave a voicemail as directed and we will return your call as soon as possible. Messages left after 4 pm will be answered the following business day.   You may also send us a message via MyChart. We typically respond to MyChart messages within 1-2 business days.  For prescription refills, please ask your  pharmacy to contact our office. Our fax number is [phone].  If you have an urgent issue when the clinic is closed that cannot wait until the next business day, you can page your doctor at the number below.    Please note that while we do our best to be available for urgent issues outside of office hours, we are not available 24/7.   If you have an urgent issue and are unable to reach us, you may choose to seek medical care at your doctor's office, retail clinic, urgent care center, or emergency room.  If you have a medical emergency, please immediately call 911 or go to the emergency department.  Pager Numbers  - Dr. Kowalski: [phone]  - Dr. Moye: [phone]  - Dr. Stewart: [phone]  In the event of inclement weather, please call our main line at [phone] for an update on the status of any delays or closures.  Dermatology Medication Tips: Please keep the boxes that topical medications come in in order to help keep track of the instructions about where and how to use these. Pharmacies typically print the medication instructions only on the boxes and not directly on the medication tubes.   If your medication is too expensive, please contact our office at [phone] option 4 or send us a message through MyChart.   We are unable to tell what your co-pay for medications will be in advance as this is different depending on your insurance coverage.   However, we may be able to find a substitute medication at lower cost or fill out paperwork to get insurance to cover a needed medication.   If a prior authorization is required to get your medication covered by your insurance company, please allow us 1-2 business days to complete this process.  Drug prices often vary depending on where the prescription is filled and some pharmacies may offer cheaper prices.  The website www.goodrx.com contains coupons for medications through different pharmacies. The prices here do not  account for what the cost may be with help from insurance (it may be cheaper with your insurance), but the website can give you the price if you did not use any insurance.  - You can print the associated coupon and take it with your prescription to the pharmacy.  - You may also stop by our office during regular business hours and pick up a GoodRx coupon card.  - If you need your prescription sent electronically to a different pharmacy, notify our office through [facility] MyChart or by phone at [phone] option 4.  

## 2021-07-27 DIAGNOSIS — H02132 Senile ectropion of right lower eyelid: Secondary | ICD-10-CM | POA: Diagnosis not present

## 2021-07-27 DIAGNOSIS — H02831 Dermatochalasis of right upper eyelid: Secondary | ICD-10-CM | POA: Diagnosis not present

## 2021-07-27 DIAGNOSIS — H02401 Unspecified ptosis of right eyelid: Secondary | ICD-10-CM | POA: Diagnosis not present

## 2021-07-31 ENCOUNTER — Telehealth: Payer: Self-pay

## 2021-07-31 NOTE — Telephone Encounter (Signed)
Spoke with pt and informed his of results. He had no concerns.

## 2021-07-31 NOTE — Telephone Encounter (Signed)
-----   Message from Brendolyn Patty, MD sent at 07/31/2021  8:40 AM EDT ----- Skin , left upper forehead BASAL CELL CARCINOMA, NODULAR AND INFILTRATIVE PATTERNS, BASE INVOLVED  BCC skin cancer- already treated with EDC at time of biopsy, observe for recurrence   - please call patient

## 2021-07-31 NOTE — Telephone Encounter (Signed)
Left pt msg to call for bx results/sh 

## 2021-08-10 ENCOUNTER — Ambulatory Visit (INDEPENDENT_AMBULATORY_CARE_PROVIDER_SITE_OTHER): Payer: Medicare Other | Admitting: Podiatry

## 2021-08-10 ENCOUNTER — Other Ambulatory Visit: Payer: Self-pay

## 2021-08-10 ENCOUNTER — Encounter: Payer: Self-pay | Admitting: Podiatry

## 2021-08-10 DIAGNOSIS — M79675 Pain in left toe(s): Secondary | ICD-10-CM

## 2021-08-10 DIAGNOSIS — M79674 Pain in right toe(s): Secondary | ICD-10-CM

## 2021-08-10 DIAGNOSIS — I872 Venous insufficiency (chronic) (peripheral): Secondary | ICD-10-CM

## 2021-08-10 DIAGNOSIS — B351 Tinea unguium: Secondary | ICD-10-CM

## 2021-08-10 DIAGNOSIS — N183 Chronic kidney disease, stage 3 unspecified: Secondary | ICD-10-CM

## 2021-08-10 NOTE — Progress Notes (Signed)
This patient returns to my office for at risk foot care.  This patient requires this care by a professional since this patient will be at risk due to having chronic kidney disease and chronic venous insufficiency.  This patient is unable to cut nails himself since the patient cannot reach his nails.These nails are painful walking and wearing shoes.  This patient presents for at risk foot care today.  General Appearance  Alert, conversant and in no acute stress.  Vascular  Dorsalis pedis and posterior tibial pulses  are palpable  Right.  Dorsalis pedis and posterior tibial pulses are absent left foot..  Capillary return is within normal limits  bilaterally. Temperature is within normal limits  Bilaterally. Absent digital hair B/L.  Neurologic  Senn-Weinstein monofilament wire test within normal limits  bilaterally. Muscle power within normal limits bilaterally.  Nails Thick disfigured discolored nails with subungual debris  from hallux to fifth toes bilaterally. No evidence of bacterial infection or drainage bilaterally.  Orthopedic  No limitations of motion  feet .  No crepitus or effusions noted.  No bony pathology or digital deformities noted.  Skin  normotropic skin with no porokeratosis noted bilaterally.  No signs of infections or ulcers noted.     Onychomycosis  Pain in right toes  Pain in left toes  Consent was obtained for treatment procedures.   Mechanical debridement of nails 1-5  bilaterally performed with a nail nipper.  Filed with dremel without incident.    Return office visit   10 weeks                  Told patient to return for periodic foot care and evaluation due to potential at risk complications.   Gardiner Barefoot DPM

## 2021-08-22 DIAGNOSIS — H02132 Senile ectropion of right lower eyelid: Secondary | ICD-10-CM | POA: Insufficient documentation

## 2021-08-22 DIAGNOSIS — H168 Other keratitis: Secondary | ICD-10-CM | POA: Insufficient documentation

## 2021-08-22 DIAGNOSIS — H02403 Unspecified ptosis of bilateral eyelids: Secondary | ICD-10-CM | POA: Insufficient documentation

## 2021-08-22 DIAGNOSIS — H02831 Dermatochalasis of right upper eyelid: Secondary | ICD-10-CM | POA: Insufficient documentation

## 2021-08-24 DIAGNOSIS — Z23 Encounter for immunization: Secondary | ICD-10-CM | POA: Diagnosis not present

## 2021-09-04 DIAGNOSIS — H90A22 Sensorineural hearing loss, unilateral, left ear, with restricted hearing on the contralateral side: Secondary | ICD-10-CM | POA: Diagnosis not present

## 2021-09-14 DIAGNOSIS — Z23 Encounter for immunization: Secondary | ICD-10-CM | POA: Diagnosis not present

## 2021-09-18 DIAGNOSIS — H353221 Exudative age-related macular degeneration, left eye, with active choroidal neovascularization: Secondary | ICD-10-CM | POA: Diagnosis not present

## 2021-09-25 DIAGNOSIS — H0100B Unspecified blepharitis left eye, upper and lower eyelids: Secondary | ICD-10-CM | POA: Diagnosis not present

## 2021-10-02 DIAGNOSIS — H0014 Chalazion left upper eyelid: Secondary | ICD-10-CM | POA: Diagnosis not present

## 2021-10-19 ENCOUNTER — Other Ambulatory Visit: Payer: Self-pay

## 2021-10-19 ENCOUNTER — Encounter: Payer: Self-pay | Admitting: Podiatry

## 2021-10-19 ENCOUNTER — Ambulatory Visit (INDEPENDENT_AMBULATORY_CARE_PROVIDER_SITE_OTHER): Payer: Medicare Other | Admitting: Podiatry

## 2021-10-19 DIAGNOSIS — I872 Venous insufficiency (chronic) (peripheral): Secondary | ICD-10-CM

## 2021-10-19 DIAGNOSIS — M79674 Pain in right toe(s): Secondary | ICD-10-CM | POA: Diagnosis not present

## 2021-10-19 DIAGNOSIS — B351 Tinea unguium: Secondary | ICD-10-CM

## 2021-10-19 DIAGNOSIS — M79675 Pain in left toe(s): Secondary | ICD-10-CM

## 2021-10-19 DIAGNOSIS — N183 Chronic kidney disease, stage 3 unspecified: Secondary | ICD-10-CM

## 2021-10-19 NOTE — Progress Notes (Signed)
This patient returns to my office for at risk foot care.  This patient requires this care by a professional since this patient will be at risk due to having chronic kidney disease and chronic venous insufficiency.  This patient is unable to cut nails himself since the patient cannot reach his nails.These nails are painful walking and wearing shoes.  This patient presents for at risk foot care today.  General Appearance  Alert, conversant and in no acute stress.  Vascular  Dorsalis pedis and posterior tibial pulses  are palpable  Right.  Dorsalis pedis and posterior tibial pulses are absent left foot..  Capillary return is within normal limits  bilaterally. Temperature is within normal limits  Bilaterally. Absent digital hair B/L.  Neurologic  Senn-Weinstein monofilament wire test within normal limits  bilaterally. Muscle power within normal limits bilaterally.  Nails Thick disfigured discolored nails with subungual debris  from hallux to fifth toes bilaterally. No evidence of bacterial infection or drainage bilaterally.  Orthopedic  No limitations of motion  feet .  No crepitus or effusions noted.  No bony pathology or digital deformities noted.  Skin  normotropic skin with no porokeratosis noted bilaterally.  No signs of infections or ulcers noted.     Onychomycosis  Pain in right toes  Pain in left toes  Consent was obtained for treatment procedures.   Mechanical debridement of nails 1-5  bilaterally performed with a nail nipper.  Filed with dremel without incident.    Return office visit   12 weeks                  Told patient to return for periodic foot care and evaluation due to potential at risk complications.   Gardiner Barefoot DPM

## 2021-10-23 DIAGNOSIS — H02135 Senile ectropion of left lower eyelid: Secondary | ICD-10-CM | POA: Diagnosis not present

## 2021-10-23 DIAGNOSIS — H168 Other keratitis: Secondary | ICD-10-CM | POA: Diagnosis not present

## 2021-10-23 DIAGNOSIS — H02831 Dermatochalasis of right upper eyelid: Secondary | ICD-10-CM | POA: Diagnosis not present

## 2021-10-23 DIAGNOSIS — H02834 Dermatochalasis of left upper eyelid: Secondary | ICD-10-CM | POA: Diagnosis not present

## 2021-10-23 DIAGNOSIS — Z79899 Other long term (current) drug therapy: Secondary | ICD-10-CM | POA: Diagnosis not present

## 2021-10-23 DIAGNOSIS — H02403 Unspecified ptosis of bilateral eyelids: Secondary | ICD-10-CM | POA: Diagnosis not present

## 2021-10-23 DIAGNOSIS — H02132 Senile ectropion of right lower eyelid: Secondary | ICD-10-CM | POA: Diagnosis not present

## 2021-10-30 ENCOUNTER — Other Ambulatory Visit: Payer: Self-pay | Admitting: Internal Medicine

## 2021-12-01 DIAGNOSIS — H401131 Primary open-angle glaucoma, bilateral, mild stage: Secondary | ICD-10-CM | POA: Diagnosis not present

## 2021-12-14 DIAGNOSIS — H04123 Dry eye syndrome of bilateral lacrimal glands: Secondary | ICD-10-CM | POA: Diagnosis not present

## 2021-12-25 DIAGNOSIS — H353221 Exudative age-related macular degeneration, left eye, with active choroidal neovascularization: Secondary | ICD-10-CM | POA: Diagnosis not present

## 2021-12-25 DIAGNOSIS — H401131 Primary open-angle glaucoma, bilateral, mild stage: Secondary | ICD-10-CM | POA: Diagnosis not present

## 2021-12-27 DIAGNOSIS — Z86718 Personal history of other venous thrombosis and embolism: Secondary | ICD-10-CM | POA: Diagnosis not present

## 2021-12-27 DIAGNOSIS — E785 Hyperlipidemia, unspecified: Secondary | ICD-10-CM | POA: Diagnosis not present

## 2021-12-27 DIAGNOSIS — I493 Ventricular premature depolarization: Secondary | ICD-10-CM | POA: Diagnosis not present

## 2021-12-27 DIAGNOSIS — I872 Venous insufficiency (chronic) (peripheral): Secondary | ICD-10-CM | POA: Diagnosis not present

## 2021-12-27 DIAGNOSIS — Z23 Encounter for immunization: Secondary | ICD-10-CM | POA: Diagnosis not present

## 2021-12-27 DIAGNOSIS — R001 Bradycardia, unspecified: Secondary | ICD-10-CM | POA: Diagnosis not present

## 2022-01-08 DIAGNOSIS — R600 Localized edema: Secondary | ICD-10-CM | POA: Diagnosis not present

## 2022-01-08 DIAGNOSIS — B351 Tinea unguium: Secondary | ICD-10-CM | POA: Diagnosis not present

## 2022-01-08 DIAGNOSIS — L603 Nail dystrophy: Secondary | ICD-10-CM | POA: Diagnosis not present

## 2022-01-08 DIAGNOSIS — I7091 Generalized atherosclerosis: Secondary | ICD-10-CM | POA: Diagnosis not present

## 2022-01-17 ENCOUNTER — Ambulatory Visit
Admission: EM | Admit: 2022-01-17 | Discharge: 2022-01-17 | Disposition: A | Discharge status: Home / Self Care | Payer: Medicare Other | Attending: Emergency Medicine | Admitting: Emergency Medicine

## 2022-01-17 ENCOUNTER — Other Ambulatory Visit: Payer: Self-pay

## 2022-01-17 ENCOUNTER — Encounter: Payer: Self-pay | Admitting: Emergency Medicine

## 2022-01-17 DIAGNOSIS — R051 Acute cough: Secondary | ICD-10-CM

## 2022-01-17 DIAGNOSIS — H1031 Unspecified acute conjunctivitis, right eye: Secondary | ICD-10-CM | POA: Diagnosis not present

## 2022-01-17 MED ORDER — POLYMYXIN B-TRIMETHOPRIM 10000-0.1 UNIT/ML-% OP SOLN: 1.0000 [drp] | Freq: Four times a day (QID) | OPHTHALMIC | 0 refills | Status: AC

## 2022-01-17 MED ORDER — BENZONATATE 100 MG PO CAPS: 100.0000 mg | ORAL_CAPSULE | Freq: Three times a day (TID) | ORAL | 0 refills | Status: DC | PRN

## 2022-01-17 NOTE — Discharge Instructions (Addendum)
Take the [facility] as directed for cough.  Your COVID test is pending.    Use the eye drops as directed.    Follow up with your primary care provider if your symptoms are not improving.

## 2022-01-17 NOTE — ED Provider Notes (Signed)
Juan Horn    CSN: [id number] Arrival date & time: 01/17/22  0846      History   Chief Complaint Chief Complaint  Patient presents with   Cough   Eye irritation     HPI Juan Horn is a 88 y.o. male.  Accompanied by his wife, patient presents with nonproductive cough x1 week.  He also reports eye redness, drainage, irritation x2 days; worse in the right eye.  No eye injury.  No change in vision or acute eye pain.  He denies fever, chills, ear pain, sore throat, congestion, chest pain, shortness of breath, vomiting, diarrhea, or other symptoms.  Treatment at home with Mucinex.  His medical history includes pulmonary embolism, PVCs, diverticulosis, prostate cancer, chronic venous insufficiency, CKD.  The history is provided by the patient, the spouse and medical records.   Past Medical History:  Diagnosis Date   Actinic keratosis    Anxiety    BCC (basal cell carcinoma of skin) 07/24/2021   Left upper forehead, EDC   Cancer ([employer]) 2010   Prostate Cancer   Cataract    left eye   Chronic renal disease, stage III ([place])    Chronic venous insufficiency    Clotting disorder ([place]) 2013   blood clot 3 days post knee surgery   Diverticulosis of colon    DVT (deep venous thrombosis) ([place]) 2012   after knee replacement   Glaucoma    HLD (hyperlipidemia)    Hx of basal cell carcinoma 12/23/2018   L nasal tip   Hx of basal cell carcinoma 12/23/2018   R preauricular   Hx of colonic polyp    Macular degeneration    legally blind in right eye   OA (osteoarthritis)    Personal history of prostate cancer    Pulmonary embolism ([place]) 10/2012   post op TKR   PVC (premature ventricular contraction)    Spinal stenosis of lumbar region    Squamous cell carcinoma of skin 05/06/2018   L dorsal forearm near anticubital    Patient Active Problem List   Diagnosis Date Noted   Pain due to onychomycosis of toenails of both feet 10/31/2020   Gastroesophageal reflux  disease 10/15/2018   Chronic renal disease, stage III ([place])    Stage 3a chronic kidney disease ([place]) 05/06/2018   Hearing loss 08/13/2017   MDD (major depressive disorder), recurrent, in full remission ([place]) 01/22/2017   History of DVT (deep vein thrombosis) 01/22/2017   Symptomatic bradycardia 01/02/2017   Spinal stenosis, lumbar region, with neurogenic claudication 10/29/2016   DDD (degenerative disc disease), lumbar 12/20/2015   Advance directive discussed with patient 04/12/2015   Personal history of prostate cancer    Routine general medical examination at a health care facility 04/02/2013   Chronic venous insufficiency    Osteoarthrosis of knee 03/23/2011   Glaucoma    Macular degeneration    Hyperlipemia 10/24/2010   DIVERTICULOSIS, COLON 10/24/2010    Past Surgical History:  Procedure Laterality Date   APPENDECTOMY     CATARACT EXTRACTION W/PHACO Left 06/24/2019   Procedure: CATARACT EXTRACTION PHACO AND INTRAOCULAR LENS PLACEMENT ([place]) LEFT;  Surgeon: Leandrew Koyanagi, MD;  Location: [facility];  Service: Ophthalmology;  Laterality: Left;   COLONOSCOPY  2011   INGUINAL HERNIA REPAIR     left   INGUINAL HERNIA REPAIR  5/12   Dr Priscille Heidelberg   INGUINAL HERNIA REPAIR  5/12   Dr Jamal Collin did redo of this   JOINT  REPLACEMENT  11/13   Left total knee--Dr Hooten   KNEE SURGERY  2013   POLYPECTOMY  2011   PROSTATECTOMY  2010   TONSILLECTOMY     VARICOSE VEIN SURGERY     left       Home Medications    Prior to Admission medications   Medication Sig Start Date End Date Taking? Authorizing Provider  benzonatate (TESSALON) 100 MG capsule Take 1 capsule (100 mg total) by mouth 3 (three) times daily as needed for cough. 01/17/22  Yes Sharion Balloon, NP  trimethoprim-polymyxin b (POLYTRIM) ophthalmic solution Place 1 drop into both eyes 4 (four) times daily for 7 days. 01/17/22 01/24/22 Yes Sharion Balloon, NP  Ascorbic Acid (VITAMIN C PO) Take by mouth daily.     [provider]  Cholecalciferol (VITAMIN D3 PO) Take 1,000 Units by mouth daily.    [provider]  hydrocortisone 2.5 % cream Apply topically 2 (two) times daily as needed.    [provider]  hydroxypropyl methylcellulose / hypromellose (ISOPTO TEARS / GONIOVISC) 2.5 % ophthalmic solution 1 drop as needed for dry eyes.    [provider]  ibuprofen (ADVIL,MOTRIN) 200 MG tablet Take 600 mg by mouth daily as needed.    [provider]  latanoprost (XALATAN) 0.005 % ophthalmic solution Place 1 drop into both eyes at bedtime.    [provider]  Multiple Vitamins-Minerals (PRESERVISION AREDS 2 PO) Take 2 tablets by mouth daily.     [provider]  omeprazole (PRILOSEC) 20 MG capsule Take 1 capsule (20 mg total) by mouth daily. 05/19/21   Venia Carbon, MD  pravastatin (PRAVACHOL) 20 MG tablet TAKE 1 TABLET DAILY 10/31/21   Viviana Simpler I, MD  sertraline (ZOLOFT) 50 MG tablet TAKE 1 TABLET DAILY 05/29/21   Venia Carbon, MD    Family History Family History  Problem Relation Age of Onset   Stroke Father    Dementia Mother    Leukemia Sister    Colon cancer Neg Hx     Social History Social History   Tobacco Use   Smoking status: Former    Types: Cigarettes    Quit date: 12/03/1978    Years since quitting: 43.1   Smokeless tobacco: Never  Vaping Use   Vaping Use: Never used  Substance Use Topics   Alcohol use: Yes    Alcohol/week: 7.0 standard drinks    Types: 7 Glasses of wine per week   Drug use: No     Allergies   Naphazoline-polyethyl glycol   Review of Systems Review of Systems  Constitutional:  Negative for chills and fever.  HENT:  Negative for ear pain and sore throat.   Eyes:  Positive for discharge and redness. Negative for pain, itching and visual disturbance.  Respiratory:  Positive for cough. Negative for shortness of breath.   Cardiovascular:  Negative for chest pain and palpitations.   Gastrointestinal:  Negative for diarrhea and vomiting.  Skin:  Negative for color change and rash.  All other systems reviewed and are negative.   Physical Exam Triage Vital Signs ED Triage Vitals  Enc Vitals Group     BP      Pulse      Resp      Temp      Temp src      SpO2      Weight      Height      Head Circumference  Peak Flow      Pain Score      Pain Loc      Pain Edu?      Excl. in [place]?    No data found.  Updated Vital Signs BP 130/63 (BP Location: Left Arm)    Pulse 89    Temp 98.2 F (36.8 C)    Resp (!) 26    SpO2 97%   Visual Acuity Right Eye Distance:   Left Eye Distance:   Bilateral Distance:    Right Eye Near:   Left Eye Near:    Bilateral Near:     Physical Exam Vitals and nursing note reviewed.  Constitutional:      General: He is not in acute distress.    Appearance: He is well-developed.  HENT:     Right Ear: Tympanic membrane normal.     Left Ear: Tympanic membrane normal.     Nose: Nose normal.     Mouth/Throat:     Mouth: Mucous membranes are moist.  Eyes:     General: Lids are normal. Vision grossly intact.     Extraocular Movements: Extraocular movements intact.     Conjunctiva/sclera:     Right eye: Right conjunctiva is injected.     Left eye: Left conjunctiva is injected.     Pupils: Pupils are equal, round, and reactive to light.     Comments: Conjunctiva injected, R>L.   Cardiovascular:     Rate and Rhythm: Normal rate. Rhythm irregular.     Heart sounds: Normal heart sounds.  Pulmonary:     Effort: Pulmonary effort is normal. No respiratory distress.     Breath sounds: Normal breath sounds. No wheezing or rhonchi.  Abdominal:     Palpations: Abdomen is soft.     Tenderness: There is no abdominal tenderness.  Musculoskeletal:     Cervical back: Neck supple.  Skin:    General: Skin is warm and dry.  Neurological:     Mental Status: He is alert.  Psychiatric:        Mood and Affect: Mood normal.         Behavior: Behavior normal.     UC Treatments / Results  Labs (all labs ordered are listed, but only abnormal results are displayed) Labs Reviewed  NOVEL CORONAVIRUS, NAA    EKG   Radiology No results found.  Procedures Procedures (including critical care time)  Medications Ordered in UC Medications - No data to display  Initial Impression / Assessment and Plan / UC Course  I have reviewed the triage vital signs and the nursing notes.  Pertinent labs & imaging results that were available during my care of the patient were reviewed by me and considered in my medical decision making (see chart for details).   Cough, Acute conjunctivitis, R>L.  Patient reports cough is nonproductive.  Lungs are CTA, O2 sat 97% on room air, no respiratory distress.  Treating cough with Tessalon Perles. Treating conjunctivitis with Polytrim eye drops.  COVID pending.  Instructed patient to self quarantine per CDC guidelines.  Discussed symptomatic treatment including Tylenol, rest, hydration.  Instructed patient to follow up with PCP if symptoms are not improving.  Patient agrees to plan of care.    Final Clinical Impressions(s) / UC Diagnoses   Final diagnoses:  Acute cough  Acute conjunctivitis of right eye, unspecified acute conjunctivitis type     Discharge Instructions      Take the Tessalon Perles as directed for cough.  Your COVID test is pending.    Use the eye drops as directed.    Follow up with your primary care provider if your symptoms are not improving.         ED Prescriptions     Medication Sig Dispense Auth. Provider   benzonatate (TESSALON) 100 MG capsule Take 1 capsule (100 mg total) by mouth 3 (three) times daily as needed for cough. 21 capsule Sharion Balloon, NP   trimethoprim-polymyxin b (POLYTRIM) ophthalmic solution Place 1 drop into both eyes 4 (four) times daily for 7 days. 10 mL Sharion Balloon, NP      PDMP not reviewed this encounter.   Sharion Balloon, NP 01/17/22 [id number]

## 2022-01-17 NOTE — ED Triage Notes (Signed)
Pt presents with a cough x 1 week and bilateral eye irritation x 2 days

## 2022-01-18 ENCOUNTER — Ambulatory Visit: Payer: Medicare Other | Admitting: Podiatry

## 2022-01-18 ENCOUNTER — Telehealth: Payer: Self-pay | Admitting: Internal Medicine

## 2022-01-18 LAB — NOVEL CORONAVIRUS, NAA: SARS-CoV-2, NAA: NOT DETECTED

## 2022-01-18 MED ORDER — HYDROCODONE BIT-HOMATROP MBR 5-1.5 MG/5ML PO SOLN: 5.0000 mL | Freq: Every evening | ORAL | 0 refills | Status: DC | PRN

## 2022-01-18 NOTE — Telephone Encounter (Signed)
Spoke to pt's wife. She appreciated the medications. I did make her aware that insurance may not cover the narcotic cough syrup.

## 2022-01-18 NOTE — Telephone Encounter (Signed)
Pt wife called, he was seen yesterday at [facility] for a sever cough they gave him benzonatate (TESSALON) 100 MG capsule it is not helping and they would like something else called in. COVID test is pending

## 2022-01-18 NOTE — Addendum Note (Signed)
Addended by: Viviana Simpler I on: 01/18/2022 12:49 PM   Modules accepted: Orders

## 2022-01-18 NOTE — Telephone Encounter (Signed)
Please let him know I sent a prescription for a strong narcotic cough syrup---mostly for use at bedtime.  He can double the benzonatate to 200mg  and add robitussin DM for during the day (both at the same time if needed)

## 2022-01-30 ENCOUNTER — Ambulatory Visit (INDEPENDENT_AMBULATORY_CARE_PROVIDER_SITE_OTHER): Payer: Medicare Other | Admitting: Dermatology

## 2022-01-30 ENCOUNTER — Other Ambulatory Visit: Payer: Self-pay

## 2022-01-30 DIAGNOSIS — L219 Seborrheic dermatitis, unspecified: Secondary | ICD-10-CM | POA: Diagnosis not present

## 2022-01-30 DIAGNOSIS — Z85828 Personal history of other malignant neoplasm of skin: Secondary | ICD-10-CM

## 2022-01-30 DIAGNOSIS — L814 Other melanin hyperpigmentation: Secondary | ICD-10-CM | POA: Diagnosis not present

## 2022-01-30 DIAGNOSIS — L578 Other skin changes due to chronic exposure to nonionizing radiation: Secondary | ICD-10-CM | POA: Diagnosis not present

## 2022-01-30 DIAGNOSIS — R21 Rash and other nonspecific skin eruption: Secondary | ICD-10-CM

## 2022-01-30 DIAGNOSIS — L738 Other specified follicular disorders: Secondary | ICD-10-CM

## 2022-01-30 DIAGNOSIS — L820 Inflamed seborrheic keratosis: Secondary | ICD-10-CM | POA: Diagnosis not present

## 2022-01-30 DIAGNOSIS — L853 Xerosis cutis: Secondary | ICD-10-CM | POA: Diagnosis not present

## 2022-01-30 DIAGNOSIS — D1800 Hemangioma unspecified site: Secondary | ICD-10-CM

## 2022-01-30 DIAGNOSIS — Z1283 Encounter for screening for malignant neoplasm of skin: Secondary | ICD-10-CM | POA: Diagnosis not present

## 2022-01-30 DIAGNOSIS — D229 Melanocytic nevi, unspecified: Secondary | ICD-10-CM | POA: Diagnosis not present

## 2022-01-30 DIAGNOSIS — L720 Epidermal cyst: Secondary | ICD-10-CM

## 2022-01-30 DIAGNOSIS — L821 Other seborrheic keratosis: Secondary | ICD-10-CM | POA: Diagnosis not present

## 2022-01-30 NOTE — Progress Notes (Signed)
Follow-Up Visit   Subjective  Juan Horn is a 88 y.o. male who presents for the following: UBSE (Patient here for upper body skin exam and skin cancer screening. Patient does have hx BCC, SCC and AK's. Patient does have a spot at right posterior shoulder that bothers him when he's sleeping, present for about 6 months. /). Spot on neck gets irritated.  The following portions of the chart were reviewed this encounter and updated as appropriate:       Review of Systems:  No other skin or systemic complaints except as noted in HPI or Assessment and Plan.  Objective  Well appearing patient in no apparent distress; mood and affect are within normal limits.  All skin waist up examined.  right shoulder overlying bone pressure point Pink/violaceous tender patch, no ulceration, pt sleeps on right side  Left Spinal Lower Back, left neck Subcutaneous nodule.   left neck x 1 Erythematous stuck-on, waxy papule or plaque  right paranasal 4 mm yellow lobulated papule  ears, eyebrows, right inferior jaw Pink patches with greasy scale.     Assessment & Plan  Rash right shoulder overlying bone pressure point  Pressure injury, stage 1  Patient advised to avoid sleeping on the right side or use a cushioned pillow to decrease pressure to area.  Epidermal inclusion cyst Left Spinal Lower Back, left neck  Benign-appearing. Exam most consistent with an epidermal inclusion cyst. Discussed that a cyst is a benign growth that can grow over time and sometimes get irritated or inflamed. Recommend observation if it is not bothersome. Discussed option of surgical excision to remove it if it is growing, symptomatic, or other changes noted. Please call for new or changing lesions so they can be evaluated.    Inflamed seborrheic keratosis left neck x 1  Destruction of lesion - left neck x 1  Destruction method: cryotherapy   Informed consent: discussed and consent obtained    Lesion destroyed using liquid nitrogen: Yes   Region frozen until ice ball extended beyond lesion: Yes   Outcome: patient tolerated procedure well with no complications   Post-procedure details: wound care instructions given   Additional details:  Prior to procedure, discussed risks of blister formation, small wound, skin dyspigmentation, or rare scar following cryotherapy. Recommend Vaseline ointment to treated areas while healing.   Sebaceous hyperplasia right paranasal  Seborrheic dermatitis ears, eyebrows, right inferior jaw  With mild flare today  Continue HC 2.5 % cream 1-2 times daily as needed to rash at ears, eyebrows, right jaw  Seborrheic Dermatitis  -  is a chronic persistent rash characterized by pinkness and scaling most commonly of the mid face but also can occur on the scalp (dandruff), ears; mid chest, mid back and groin.  It tends to be exacerbated by stress and cooler weather.  People who have neurologic disease may experience new onset or exacerbation of existing seborrheic dermatitis.  The condition is not curable but treatable and can be controlled.    Lentigines - Scattered tan macules - Due to sun exposure - Benign-appearing, observe - Recommend daily broad spectrum sunscreen SPF 30+ to sun-exposed areas, reapply every 2 hours as needed. - Call for any changes  Seborrheic Keratoses - Stuck-on, waxy, tan-brown papules and/or plaques  - Benign-appearing - Discussed benign etiology and prognosis. - Observe - Call for any changes  Melanocytic Nevi - Tan-brown and/or pink-flesh-colored symmetric macules and papules - Benign appearing on exam today - Observation - Call clinic for  new or changing moles - Recommend daily use of broad spectrum spf 30+ sunscreen to sun-exposed areas.   Hemangiomas - Red papules - Discussed benign nature - Observe - Call for any changes  Actinic Damage - Chronic condition, secondary to cumulative UV/sun exposure -  diffuse scaly erythematous macules with underlying dyspigmentation - Recommend daily broad spectrum sunscreen SPF 30+ to sun-exposed areas, reapply every 2 hours as needed.  - Staying in the shade or wearing long sleeves, sun glasses (UVA+UVB protection) and wide brim hats (4-inch brim around the entire circumference of the hat) are also recommended for sun protection.  - Call for new or changing lesions.  Skin cancer screening performed today.  History of Basal Cell Carcinoma of the Skin - No evidence of recurrence today - Recommend regular full body skin exams - Recommend daily broad spectrum sunscreen SPF 30+ to sun-exposed areas, reapply every 2 hours as needed.  - Call if any new or changing lesions are noted between office visits  History of Squamous Cell Carcinoma of the Skin - No evidence of recurrence today - Recommend regular full body skin exams - Recommend daily broad spectrum sunscreen SPF 30+ to sun-exposed areas, reapply every 2 hours as needed.  - Call if any new or changing lesions are noted between office visits  Xerosis - diffuse xerotic patches - recommend gentle, hydrating skin care - gentle skin care handout given  Return in about 6 months (around 07/30/2022) for follow up sun-exposed skin, h/o BCC,SCC, AK .  Graciella Belton, RMA, am acting as scribe for Brendolyn Patty, MD .  Documentation: I have reviewed the above documentation for accuracy and completeness, and I agree with the above.  Brendolyn Patty MD

## 2022-01-30 NOTE — Patient Instructions (Addendum)
Cryotherapy Aftercare  Wash gently with soap and water everyday.   Apply Vaseline and Band-Aid daily until healed.   Continue hydrocortisone 2.5 % cream 1-2 times daily as needed to rash at ears, eyebrows, right jaw   Melanoma ABCDEs  Melanoma is the most dangerous type of skin cancer, and is the leading cause of death from skin disease.  You are more likely to develop melanoma if you: Have light-colored skin, light-colored eyes, or red or blond hair Spend a lot of time in the sun Tan regularly, either outdoors or in a tanning bed Have had blistering sunburns, especially during childhood Have a close family member who has had a melanoma Have atypical moles or large birthmarks  Early detection of melanoma is key since treatment is typically straightforward and cure rates are extremely high if we catch it early.   The first sign of melanoma is often a change in a mole or a new dark spot.  The ABCDE system is a way of remembering the signs of melanoma.  A for asymmetry:  The two halves do not match. B for border:  The edges of the growth are irregular. C for color:  A mixture of colors are present instead of an even brown color. D for diameter:  Melanomas are usually (but not always) greater than 73mm - the size of a pencil eraser. E for evolution:  The spot keeps changing in size, shape, and color.  Please check your skin once per month between visits. You can use a small mirror in front and a large mirror behind you to keep an eye on the back side or your body.   If you see any new or changing lesions before your next follow-up, please call to schedule a visit.  Please continue daily skin protection including broad spectrum sunscreen SPF 30+ to sun-exposed areas, reapplying every 2 hours as needed when you're outdoors.    If You Need Anything After Your Visit  If you have any questions or concerns for your doctor, please call our main line at [phone] and press option 4 to reach  your doctor's medical assistant. If no one answers, please leave a voicemail as directed and we will return your call as soon as possible. Messages left after 4 pm will be answered the following business day.   You may also send [place] a message via [place]. We typically respond to MyChart messages within 1-2 business days.  For prescription refills, please ask your pharmacy to contact our office. Our fax number is [phone].  If you have an urgent issue when the clinic is closed that cannot wait until the next business day, you can page your doctor at the number below.    Please note that while we do our best to be available for urgent issues outside of office hours, we are not available 24/7.   If you have an urgent issue and are unable to reach [place], you may choose to seek medical care at your doctor's office, retail clinic, urgent care center, or emergency room.  If you have a medical emergency, please immediately call 911 or go to the emergency department.  Pager Numbers  - Dr. Nehemiah Massed: [phone]  - Dr. Laurence Ferrari: [phone]  - Dr. Nicole Kindred: [phone]  In the event of inclement weather, please call our main line at [phone] for an update on the status of any delays or closures.  Dermatology Medication Tips: Please keep the boxes that topical medications come in in order to  help keep track of the instructions about where and how to use these. Pharmacies typically print the medication instructions only on the boxes and not directly on the medication tubes.   If your medication is too expensive, please contact our office at [phone] option 4 or send [place] a message through [place].   We are unable to tell what your co-pay for medications will be in advance as this is different depending on your insurance coverage. However, we may be able to find a substitute medication at lower cost or fill out paperwork to get insurance to cover a needed medication.   If a prior authorization  is required to get your medication covered by your insurance company, please allow [place] 1-2 business days to complete this process.  Drug prices often vary depending on where the prescription is filled and some pharmacies may offer cheaper prices.  The website www.goodrx.com contains coupons for medications through different pharmacies. The prices here do not account for what the cost may be with help from insurance (it may be cheaper with your insurance), but the website can give you the price if you did not use any insurance.  - You can print the associated coupon and take it with your prescription to the pharmacy.  - You may also stop by our office during regular business hours and pick up a GoodRx coupon card.  - If you need your prescription sent electronically to a different pharmacy, notify our office through [facility] or by phone at [phone] option 4.     Si Usted Necesita Algo Despus de Su Visita  Tambin puede enviarnos un mensaje a travs de Pharmacist, community. Por lo general respondemos a los mensajes de MyChart en el transcurso de 1 a 2 das hbiles.  Para renovar recetas, por favor pida a su farmacia que se ponga en contacto con nuestra oficina. Harland Dingwall de fax es [place] [phone].  Si tiene un asunto urgente cuando la clnica est cerrada y que no puede esperar hasta el siguiente da hbil, puede llamar/localizar a su doctor(a) al nmero que aparece a continuacin.   Por favor, tenga en cuenta que aunque hacemos todo lo posible para estar disponibles para asuntos urgentes fuera del horario de [place], no estamos disponibles las 24 horas del da, los 7 das de la [place].   Si tiene un problema urgente y no puede comunicarse con nosotros, puede optar por buscar atencin mdica  en el consultorio de su doctor(a), en una clnica privada, en un centro de atencin urgente o en una sala de emergencias.  Si tiene Engineering geologist, por favor llame inmediatamente al 911 o  vaya a la sala de emergencias.  Nmeros de bper  - Dr. Nehemiah Massed: [phone]  - Dra. Moye: [phone]  - Dra. Nicole Kindred: [phone]  En caso de inclemencias del [place], por favor llame a Johnsie Kindred principal al [phone] para una actualizacin sobre el [place] de cualquier retraso o cierre.  Consejos para la medicacin en dermatologa: Por favor, guarde las cajas en las que vienen los medicamentos de uso tpico para ayudarle a seguir las instrucciones sobre dnde y cmo usarlos. Las farmacias generalmente imprimen las instrucciones del medicamento slo en las cajas y no directamente en los tubos del [place].   Si su medicamento es muy caro, por favor, pngase en contacto con Zigmund Daniel llamando al [phone] y presione la opcin 4 o envenos un mensaje a travs de Pharmacist, community.   No podemos decirle cul ser su copago por  los medicamentos por adelantado ya que esto es diferente dependiendo de la cobertura de su seguro. Sin embargo, es posible que podamos encontrar un medicamento sustituto a Electrical engineer un formulario para que el seguro cubra el medicamento que se considera necesario.   Si se requiere una autorizacin previa para que su compaa de seguros [place] su medicamento, por favor permtanos de 1 a 2 das hbiles para completar este proceso.  Los precios de los medicamentos varan con frecuencia dependiendo del Environmental consultant de dnde se surte la receta y alguna farmacias pueden ofrecer precios ms baratos.  El sitio web www.goodrx.com tiene cupones para medicamentos de Airline pilot. Los precios aqu no tienen en cuenta lo que podra costar con la ayuda del seguro (puede ser ms barato con su seguro), pero el sitio web puede darle el precio si no utiliz Research scientist (physical sciences).  - Puede imprimir el cupn correspondiente y llevarlo con su receta a la farmacia.  - Tambin puede pasar por nuestra oficina durante el horario de atencin regular y Charity fundraiser una tarjeta de cupones  de GoodRx.  - Si necesita que su receta se enve electrnicamente a una farmacia diferente, informe a nuestra oficina a travs de MyChart de [place] o por telfono llamando al [phone] y presione la opcin 4.

## 2022-03-20 DIAGNOSIS — B351 Tinea unguium: Secondary | ICD-10-CM | POA: Diagnosis not present

## 2022-03-20 DIAGNOSIS — I7091 Generalized atherosclerosis: Secondary | ICD-10-CM | POA: Diagnosis not present

## 2022-03-20 DIAGNOSIS — L603 Nail dystrophy: Secondary | ICD-10-CM | POA: Diagnosis not present

## 2022-04-02 DIAGNOSIS — H353221 Exudative age-related macular degeneration, left eye, with active choroidal neovascularization: Secondary | ICD-10-CM | POA: Diagnosis not present

## 2022-04-23 ENCOUNTER — Other Ambulatory Visit: Payer: Self-pay | Admitting: Internal Medicine

## 2022-04-25 DIAGNOSIS — C4442 Squamous cell carcinoma of skin of scalp and neck: Secondary | ICD-10-CM | POA: Diagnosis not present

## 2022-04-25 DIAGNOSIS — D492 Neoplasm of unspecified behavior of bone, soft tissue, and skin: Secondary | ICD-10-CM | POA: Diagnosis not present

## 2022-04-25 DIAGNOSIS — L853 Xerosis cutis: Secondary | ICD-10-CM | POA: Diagnosis not present

## 2022-05-01 DIAGNOSIS — Z23 Encounter for immunization: Secondary | ICD-10-CM | POA: Diagnosis not present

## 2022-05-21 DIAGNOSIS — I7091 Generalized atherosclerosis: Secondary | ICD-10-CM | POA: Diagnosis not present

## 2022-05-21 DIAGNOSIS — B351 Tinea unguium: Secondary | ICD-10-CM | POA: Diagnosis not present

## 2022-05-21 DIAGNOSIS — L603 Nail dystrophy: Secondary | ICD-10-CM | POA: Diagnosis not present

## 2022-05-23 DIAGNOSIS — C4442 Squamous cell carcinoma of skin of scalp and neck: Secondary | ICD-10-CM | POA: Diagnosis not present

## 2022-05-24 DIAGNOSIS — H401131 Primary open-angle glaucoma, bilateral, mild stage: Secondary | ICD-10-CM | POA: Diagnosis not present

## 2022-05-24 DIAGNOSIS — H353221 Exudative age-related macular degeneration, left eye, with active choroidal neovascularization: Secondary | ICD-10-CM | POA: Diagnosis not present

## 2022-05-24 DIAGNOSIS — H532 Diplopia: Secondary | ICD-10-CM | POA: Diagnosis not present

## 2022-05-24 DIAGNOSIS — H04123 Dry eye syndrome of bilateral lacrimal glands: Secondary | ICD-10-CM | POA: Diagnosis not present

## 2022-05-29 ENCOUNTER — Ambulatory Visit (INDEPENDENT_AMBULATORY_CARE_PROVIDER_SITE_OTHER): Payer: Medicare Other | Admitting: Internal Medicine

## 2022-05-29 ENCOUNTER — Encounter: Payer: Self-pay | Admitting: Internal Medicine

## 2022-05-29 VITALS — BP 116/64 | HR 80 | Temp 97.9°F | Ht 66.75 in | Wt 182.0 lb

## 2022-05-29 DIAGNOSIS — F3342 Major depressive disorder, recurrent, in full remission: Secondary | ICD-10-CM | POA: Diagnosis not present

## 2022-05-29 DIAGNOSIS — M1711 Unilateral primary osteoarthritis, right knee: Secondary | ICD-10-CM | POA: Diagnosis not present

## 2022-05-29 DIAGNOSIS — H353220 Exudative age-related macular degeneration, left eye, stage unspecified: Secondary | ICD-10-CM | POA: Diagnosis not present

## 2022-05-29 DIAGNOSIS — E785 Hyperlipidemia, unspecified: Secondary | ICD-10-CM | POA: Diagnosis not present

## 2022-05-29 DIAGNOSIS — Z0000 Encounter for general adult medical examination without abnormal findings: Secondary | ICD-10-CM | POA: Diagnosis not present

## 2022-05-29 DIAGNOSIS — N1831 Chronic kidney disease, stage 3a: Secondary | ICD-10-CM

## 2022-05-29 DIAGNOSIS — K219 Gastro-esophageal reflux disease without esophagitis: Secondary | ICD-10-CM

## 2022-05-29 LAB — RENAL FUNCTION PANEL
Albumin: 4.1 g/dL (ref 3.5–5.2)
BUN: 23 mg/dL (ref 6–23)
CO2: 27 mEq/L (ref 19–32)
Calcium: 9 mg/dL (ref 8.4–10.5)
Chloride: 101 mEq/L (ref 96–112)
Creatinine, Ser: 1.45 mg/dL (ref 0.40–1.50)
GFR: 43.45 mL/min — ABNORMAL LOW (ref >60.00)
Glucose, Bld: 101 mg/dL — ABNORMAL HIGH (ref 70–99)
Phosphorus: 3.5 mg/dL (ref 2.3–4.6)
Potassium: 4.2 mEq/L (ref 3.5–5.1)
Sodium: 137 mEq/L (ref 135–145)

## 2022-05-29 LAB — LIPID PANEL
Cholesterol: 181 mg/dL (ref 0–200)
HDL: 42.1 mg/dL (ref >39.00)
NonHDL: 138.95
Total CHOL/HDL Ratio: 4
Triglycerides: 275 mg/dL — ABNORMAL HIGH (ref 0.0–149.0)
VLDL: 55 mg/dL — ABNORMAL HIGH (ref 0.0–40.0)

## 2022-05-29 LAB — LDL CHOLESTEROL, DIRECT: Direct LDL: 113 mg/dL

## 2022-05-29 LAB — HEPATIC FUNCTION PANEL
ALT: 28 U/L (ref 0–53)
AST: 21 U/L (ref 0–37)
Albumin: 4.1 g/dL (ref 3.5–5.2)
Alkaline Phosphatase: 84 U/L (ref 39–117)
Bilirubin, Direct: 0.1 mg/dL (ref 0.0–0.3)
Total Bilirubin: 0.5 mg/dL (ref 0.2–1.2)
Total Protein: 7.6 g/dL (ref 6.0–8.3)

## 2022-05-29 LAB — CBC
HCT: 38.9 % — ABNORMAL LOW (ref 39.0–52.0)
Hemoglobin: 12.4 g/dL — ABNORMAL LOW (ref 13.0–17.0)
MCHC: 32 g/dL (ref 30.0–36.0)
MCV: 83 fl (ref 78.0–100.0)
Platelets: 205 10*3/uL (ref 150.0–400.0)
RBC: 4.68 Mil/uL (ref 4.22–5.81)
RDW: 16.8 % — ABNORMAL HIGH (ref 11.5–15.5)
WBC: 7.7 10*3/uL (ref 4.0–10.5)

## 2022-05-29 LAB — VITAMIN D 25 HYDROXY (VIT D DEFICIENCY, FRACTURES): VITD: 56.96 ng/mL (ref 30.00–100.00)

## 2022-05-29 NOTE — Assessment & Plan Note (Signed)
>>  ASSESSMENT AND PLAN FOR STAGE 3A CHRONIC KIDNEY DISEASE (HCC) WRITTEN ON 05/29/2022 12:28 PM BY Teona Vargus I, MD  Borderline only No action--will check labs

## 2022-05-30 LAB — PARATHYROID HORMONE, INTACT (NO CA): PTH: 23 pg/mL (ref 16–77)

## 2022-06-01 DIAGNOSIS — H401131 Primary open-angle glaucoma, bilateral, mild stage: Secondary | ICD-10-CM | POA: Diagnosis not present

## 2022-06-12 DIAGNOSIS — C4442 Squamous cell carcinoma of skin of scalp and neck: Secondary | ICD-10-CM | POA: Diagnosis not present

## 2022-07-02 ENCOUNTER — Other Ambulatory Visit: Payer: Self-pay | Admitting: Internal Medicine

## 2022-07-09 DIAGNOSIS — H353221 Exudative age-related macular degeneration, left eye, with active choroidal neovascularization: Secondary | ICD-10-CM | POA: Diagnosis not present

## 2022-08-07 ENCOUNTER — Ambulatory Visit: Payer: Medicare Other | Admitting: Dermatology

## 2022-08-13 DIAGNOSIS — G8929 Other chronic pain: Secondary | ICD-10-CM | POA: Diagnosis not present

## 2022-08-13 DIAGNOSIS — M1711 Unilateral primary osteoarthritis, right knee: Secondary | ICD-10-CM | POA: Diagnosis not present

## 2022-08-13 DIAGNOSIS — M25561 Pain in right knee: Secondary | ICD-10-CM | POA: Diagnosis not present

## 2022-08-28 DIAGNOSIS — B351 Tinea unguium: Secondary | ICD-10-CM | POA: Diagnosis not present

## 2022-08-28 DIAGNOSIS — I7091 Generalized atherosclerosis: Secondary | ICD-10-CM | POA: Diagnosis not present

## 2022-09-18 DIAGNOSIS — Z23 Encounter for immunization: Secondary | ICD-10-CM | POA: Diagnosis not present

## 2022-09-21 ENCOUNTER — Encounter: Payer: Self-pay | Admitting: Student

## 2022-09-21 ENCOUNTER — Ambulatory Visit: Payer: Medicare Other | Admitting: Student

## 2022-09-21 VITALS — BP 124/78 | HR 75 | Temp 97.8°F | Ht 66.75 in | Wt 180.0 lb

## 2022-09-21 DIAGNOSIS — Z8546 Personal history of malignant neoplasm of prostate: Secondary | ICD-10-CM | POA: Diagnosis not present

## 2022-09-21 DIAGNOSIS — H353220 Exudative age-related macular degeneration, left eye, stage unspecified: Secondary | ICD-10-CM | POA: Diagnosis not present

## 2022-09-21 DIAGNOSIS — Z66 Do not resuscitate: Secondary | ICD-10-CM | POA: Diagnosis not present

## 2022-09-21 DIAGNOSIS — K219 Gastro-esophageal reflux disease without esophagitis: Secondary | ICD-10-CM

## 2022-09-21 DIAGNOSIS — M1711 Unilateral primary osteoarthritis, right knee: Secondary | ICD-10-CM | POA: Diagnosis not present

## 2022-09-21 DIAGNOSIS — F3342 Major depressive disorder, recurrent, in full remission: Secondary | ICD-10-CM

## 2022-09-21 DIAGNOSIS — M5136 Other intervertebral disc degeneration, lumbar region: Secondary | ICD-10-CM | POA: Diagnosis not present

## 2022-09-21 DIAGNOSIS — N1831 Chronic kidney disease, stage 3a: Secondary | ICD-10-CM

## 2022-09-21 DIAGNOSIS — I872 Venous insufficiency (chronic) (peripheral): Secondary | ICD-10-CM | POA: Diagnosis not present

## 2022-09-21 DIAGNOSIS — E785 Hyperlipidemia, unspecified: Secondary | ICD-10-CM

## 2022-09-21 DIAGNOSIS — M48062 Spinal stenosis, lumbar region with neurogenic claudication: Secondary | ICD-10-CM | POA: Diagnosis not present

## 2022-09-21 DIAGNOSIS — Z86718 Personal history of other venous thrombosis and embolism: Secondary | ICD-10-CM | POA: Diagnosis not present

## 2022-09-21 MED ORDER — SERTRALINE HCL 50 MG PO TABS: 100.0000 mg | ORAL_TABLET | Freq: Every day | ORAL | 3 refills | Status: DC

## 2022-09-21 NOTE — Patient Instructions (Addendum)
Please take two tablets of sertraline until you receive you refill.   You have an order for physical therapy.   See you in 3 months to follow up on your kidney function.   [facility].   Please come back for labs on Monday, October 23 at 7:30AM.

## 2022-09-21 NOTE — Progress Notes (Signed)
Location:    [place] of Service:    Clinic  Provider: Lory Nowaczyk  Code Status: DNR Goals of Care:     09/21/2022    2:08 PM  Advanced Directives  Does Patient Have a Medical Advance Directive? Yes  Type of Paramedic of [place];Out of facility DNR (pink MOST or yellow form)  Does patient want to make changes to medical advance directive? No - Patient declined  Copy of [place] in Chart? Yes - validated most recent copy scanned in chart (See row information)  Pre-existing out of facility DNR order (yellow form or pink MOST form) Yellow form placed in chart (order not valid for inpatient use)     Chief Complaint  Patient presents with  . Establish Care    New patient to establish care at [facility] clinic with Dr.Jayan Raymundo. Pill bottles not present at initial appointment. Request to check lungs.     HPI: Patient is a 88 y.o. male seen today for medical management of chronic diseases.  His only concern is that he used to be a smoker. He smoked from 1960-1985. 25 years; at least 2ppd most of the time he smoked (>5 years). His father also smoked and lived to 12 even after a stroke. He typiclaly doesn't have any shortness of breath. He doesn't exercise as much anymore. Never used inhalers or any kind of treatment.   R knee is bone on bone. He doesn't want to have surgery. He uses walker and rollator.  He hasn't been to PT yet. He feels like th walker is safer  The slides at the back make it more s He also has a scooter. No falls in the last year.   He has arthritis; no medicine for it.   DVT was in 2012. He was out of the hospital a day or two. That's why he is discouraged to have a surgery on the right knee.   He had surgical removal of eyelid skin.   69 years married. NO children. HCPOA first his wife, and the second would be his niece brenda.   Used to work for [employer]. From [place], [place]. He enjoyed Public librarian. He can't get out to do those things because of his knee. 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 He has a breakfast club and a coffee group. He organizes numerous things. Sometimes he gets down.    Past Medical History:  Diagnosis Date  . Actinic keratosis   . Anxiety   . BCC (basal cell carcinoma of skin) 07/24/2021   Left upper forehead, EDC  . Cancer [facility]) 2010   Prostate Cancer  . Cataract    left eye  . Chronic renal disease, stage III ([place])   . Chronic venous insufficiency   . Clotting disorder ([place]) 2013   blood clot 3 days post knee surgery  . Diverticulosis of colon   . DVT (deep venous thrombosis) ([place]) 2012   after knee replacement  . Glaucoma   . HLD (hyperlipidemia)   . Hx of basal cell carcinoma 12/23/2018   L nasal tip  . Hx of basal cell carcinoma 12/23/2018   R preauricular  . Hx of colonic polyp   . Macular degeneration    legally blind in right eye  . OA (osteoarthritis)   . Personal history of prostate cancer   . Pulmonary embolism ([place]) 10/2012   post op TKR  .  PVC (premature ventricular contraction)   . Spinal stenosis of lumbar region   . Squamous cell carcinoma of skin 05/06/2018   L dorsal forearm near anticubital    Past Surgical History:  Procedure Laterality Date  . APPENDECTOMY    . CATARACT EXTRACTION W/PHACO Left 06/24/2019   Procedure: CATARACT EXTRACTION PHACO AND INTRAOCULAR LENS PLACEMENT ([place]) LEFT;  Surgeon: Leandrew Koyanagi, MD;  Location: [place];  Service: Ophthalmology;  Laterality: Left;  . COLONOSCOPY  2011  . INGUINAL HERNIA REPAIR     left  . INGUINAL HERNIA REPAIR  5/12   Dr Priscille Heidelberg  . INGUINAL HERNIA REPAIR  5/12   Dr Jamal Collin did redo of this  . JOINT REPLACEMENT  11/13   Left total knee--Dr Hooten  . KNEE SURGERY  2013  . POLYPECTOMY  2011  . PROSTATECTOMY  2010  . TONSILLECTOMY    . VARICOSE VEIN SURGERY     left    Allergies  Allergen Reactions  .  Naphazoline-Polyethyl Glycol Other (See Comments)    (Afgan) redness    Outpatient Encounter Medications as of 09/21/2022  Medication Sig  . Ascorbic Acid (VITAMIN C PO) Take by mouth daily.  . Cholecalciferol (VITAMIN D3 PO) Take 1,000 Units by mouth daily.  . hydrocortisone 2.5 % cream Apply topically 2 (two) times daily as needed.  . hydroxypropyl methylcellulose / hypromellose (ISOPTO TEARS / GONIOVISC) 2.5 % ophthalmic solution 1 drop as needed for dry eyes.  Marland Kitchen latanoprost (XALATAN) 0.005 % ophthalmic solution Place 1 drop into both eyes at bedtime.  . Multiple Vitamins-Minerals (PRESERVISION AREDS 2 PO) Take 2 tablets by mouth daily.   . pravastatin (PRAVACHOL) 20 MG tablet TAKE 1 TABLET DAILY  . sertraline (ZOLOFT) 50 MG tablet Take 2 tablets (100 mg total) by mouth daily.  . [DISCONTINUED] sertraline (ZOLOFT) 50 MG tablet TAKE 1 TABLET DAILY  . [DISCONTINUED] omeprazole (PRILOSEC) 20 MG capsule TAKE 1 CAPSULE DAILY   No facility-administered encounter medications on file as of 09/21/2022.    Review of Systems:  Review of Systems  All other systems reviewed and are negative.  Health Maintenance  Topic Date Due  . TETANUS/TDAP  10/24/2020  . COVID-19 Vaccine (5 - Moderna risk series) 06/13/2021  . Pneumonia Vaccine 24+ Years old  Completed  . INFLUENZA VACCINE  Completed  . Zoster Vaccines- Shingrix  Completed  . HPV VACCINES  Aged Out    Physical Exam: Vitals:   09/21/22 1402  BP: 124/78  Pulse: 75  Temp: 97.8 F (36.6 C)  TempSrc: Temporal  SpO2: 98%  Weight: 180 lb (81.6 kg)  Height: 5' 6.75" (1.695 m)   Body mass index is 28.4 kg/m. Physical Exam Vitals reviewed.  Constitutional:      Appearance: He is normal weight.  Cardiovascular:     Rate and Rhythm: Normal rate and regular rhythm.  Pulmonary:     Effort: Pulmonary effort is normal.     Breath sounds: Normal breath sounds.  Abdominal:     General: Abdomen is flat.     Palpations: Abdomen is  soft.  Musculoskeletal:     Comments: Trace bilateral lower extremity edema.   Neurological:     Mental Status: He is alert and oriented to person, place, and time.     Comments: Abnormal gait with left foot dragging on with ambulation. Using 2-wheeled walker.   Psychiatric:        Behavior: Behavior normal.        Thought  Content: Thought content normal.        Judgment: Judgment normal.    Labs reviewed: Basic Metabolic Panel: Recent Labs    05/29/22 1235  NA 137  K 4.2  CL 101  CO2 27  GLUCOSE 101*  BUN 23  CREATININE 1.45  CALCIUM 9.0  PHOS 3.5   Liver Function Tests: Recent Labs    05/29/22 1235  AST 21  ALT 28  ALKPHOS 84  BILITOT 0.5  PROT 7.6  ALBUMIN 4.1  4.1   No results for input(s): "LIPASE", "AMYLASE" in the last 8760 hours. No results for input(s): "AMMONIA" in the last 8760 hours. CBC: Recent Labs    05/29/22 1235  WBC 7.7  HGB 12.4*  HCT 38.9*  MCV 83.0  PLT 205.0   Lipid Panel: Recent Labs    05/29/22 1235  CHOL 181  HDL 42.10  TRIG 275.0*  CHOLHDL 4  LDLDIRECT 113.0   No results found for: "HGBA1C"  Procedures since last visit: No results found.  Assessment/Plan 1. Do not resuscitate Maintains this status, hanging on fridge in the home .  2. MDD (major depressive disorder), recurrent, in full remission ([place]) He has a low mood due to changes in his overall function. Will trial increased dose of sertraline at 100 mg daily.   3. Stage 3a chronic kidney disease ([place]) Progression with decrease in his GFR to 45 in the last year. Normal PTH and Vitamin D. Discussed importance of monitoring regularly. Will check labs q61mofrom now on.   4. Exudative age-related macular degeneration of left eye, unspecified stage (HCC) Stable. Gets injections every 14 weeks.   5. Hyperlipidemia, unspecified hyperlipidemia type Cholesterol wnl with most recent labs. Continue pravastatin 20 mg daily.   6. Primary osteoarthritis of right  knee Defer surgery. Goes to the gym MWF and does leg presses. Discussed concern for his DME being incorrectly selected. Gait is unstable with left foot drop.   7. Gastroesophageal reflux disease without esophagitis No symptoms. Self d/c omeprazole. Agree with this change.   8. History of DVT (deep vein thrombosis) Postoperatively after knee replacement. Plan to defer furture elective surgeries.   9. DDD (degenerative disc disease), lumbar No concerns, continue to monitor for symptoms.   10. Personal history of prostate cancer S/p prostectomy 2010. No urinary concerns at this time.   11. Chronic venous insufficiency Trace edema today. Periodic use of compression. S/p venous resection of left lower extremity. CTM.   12. Spinal stenosis, lumbar region, with neurogenic claudication No symptoms at this time. Continue to monitor.     Labs/tests ordered:  * No order type specified * Next appt:  Visit date not found  I spent greater than 1 hour in face to face time, chart review, and patient education with this patient.  VTomasa Rand MD, [facility] [phone]

## 2022-09-24 DIAGNOSIS — N1831 Chronic kidney disease, stage 3a: Secondary | ICD-10-CM | POA: Diagnosis not present

## 2022-09-24 DIAGNOSIS — E785 Hyperlipidemia, unspecified: Secondary | ICD-10-CM | POA: Diagnosis not present

## 2022-09-24 LAB — BASIC METABOLIC PANEL
BUN: 1 — AB (ref 4–21)
CO2: 27 — AB (ref 13–22)
Chloride: 103 (ref 99–108)
Creatinine: 1.3 (ref 0.6–1.3)
Glucose: 102
Potassium: 3.9 mEq/L (ref 3.5–5.1)
Sodium: 140 (ref 137–147)

## 2022-09-24 LAB — CBC AND DIFFERENTIAL
HCT: 43 (ref 41–53)
Hemoglobin: 14.3 (ref 13.5–17.5)
Platelets: 215 10*3/uL (ref 150–400)
WBC: 8.3

## 2022-09-24 LAB — CBC: RBC: 4.93 (ref 3.87–5.11)

## 2022-09-24 LAB — COMPREHENSIVE METABOLIC PANEL
Calcium: 8.8 (ref 8.7–10.7)
eGFR: 53

## 2022-09-27 ENCOUNTER — Encounter: Payer: Self-pay | Admitting: Student

## 2022-09-27 DIAGNOSIS — N1831 Chronic kidney disease, stage 3a: Secondary | ICD-10-CM | POA: Diagnosis not present

## 2022-09-27 DIAGNOSIS — E785 Hyperlipidemia, unspecified: Secondary | ICD-10-CM | POA: Diagnosis not present

## 2022-10-04 ENCOUNTER — Encounter: Payer: Self-pay | Admitting: Student

## 2022-10-10 DIAGNOSIS — M25561 Pain in right knee: Secondary | ICD-10-CM | POA: Diagnosis not present

## 2022-10-10 DIAGNOSIS — R2689 Other abnormalities of gait and mobility: Secondary | ICD-10-CM | POA: Diagnosis not present

## 2022-10-10 DIAGNOSIS — M1711 Unilateral primary osteoarthritis, right knee: Secondary | ICD-10-CM | POA: Diagnosis not present

## 2022-10-10 DIAGNOSIS — M6281 Muscle weakness (generalized): Secondary | ICD-10-CM | POA: Diagnosis not present

## 2022-10-10 NOTE — Addendum Note (Signed)
Addended by: Dewayne Shorter on: 10/10/2022 03:52 PM   Modules accepted: Level of Service

## 2022-10-11 DIAGNOSIS — M1711 Unilateral primary osteoarthritis, right knee: Secondary | ICD-10-CM | POA: Diagnosis not present

## 2022-10-11 DIAGNOSIS — M25561 Pain in right knee: Secondary | ICD-10-CM | POA: Diagnosis not present

## 2022-10-11 DIAGNOSIS — M6281 Muscle weakness (generalized): Secondary | ICD-10-CM | POA: Diagnosis not present

## 2022-10-11 DIAGNOSIS — R2689 Other abnormalities of gait and mobility: Secondary | ICD-10-CM | POA: Diagnosis not present

## 2022-10-12 DIAGNOSIS — Z23 Encounter for immunization: Secondary | ICD-10-CM | POA: Diagnosis not present

## 2022-10-16 DIAGNOSIS — M1711 Unilateral primary osteoarthritis, right knee: Secondary | ICD-10-CM | POA: Diagnosis not present

## 2022-10-16 DIAGNOSIS — M25561 Pain in right knee: Secondary | ICD-10-CM | POA: Diagnosis not present

## 2022-10-16 DIAGNOSIS — M6281 Muscle weakness (generalized): Secondary | ICD-10-CM | POA: Diagnosis not present

## 2022-10-16 DIAGNOSIS — R2689 Other abnormalities of gait and mobility: Secondary | ICD-10-CM | POA: Diagnosis not present

## 2022-10-18 DIAGNOSIS — M6281 Muscle weakness (generalized): Secondary | ICD-10-CM | POA: Diagnosis not present

## 2022-10-18 DIAGNOSIS — M25561 Pain in right knee: Secondary | ICD-10-CM | POA: Diagnosis not present

## 2022-10-18 DIAGNOSIS — R2689 Other abnormalities of gait and mobility: Secondary | ICD-10-CM | POA: Diagnosis not present

## 2022-10-18 DIAGNOSIS — M1711 Unilateral primary osteoarthritis, right knee: Secondary | ICD-10-CM | POA: Diagnosis not present

## 2022-10-22 DIAGNOSIS — M1711 Unilateral primary osteoarthritis, right knee: Secondary | ICD-10-CM | POA: Diagnosis not present

## 2022-10-22 DIAGNOSIS — M6281 Muscle weakness (generalized): Secondary | ICD-10-CM | POA: Diagnosis not present

## 2022-10-22 DIAGNOSIS — M25561 Pain in right knee: Secondary | ICD-10-CM | POA: Diagnosis not present

## 2022-10-22 DIAGNOSIS — R2689 Other abnormalities of gait and mobility: Secondary | ICD-10-CM | POA: Diagnosis not present

## 2022-10-23 DIAGNOSIS — M6281 Muscle weakness (generalized): Secondary | ICD-10-CM | POA: Diagnosis not present

## 2022-10-23 DIAGNOSIS — M1711 Unilateral primary osteoarthritis, right knee: Secondary | ICD-10-CM | POA: Diagnosis not present

## 2022-10-23 DIAGNOSIS — M25561 Pain in right knee: Secondary | ICD-10-CM | POA: Diagnosis not present

## 2022-10-23 DIAGNOSIS — R2689 Other abnormalities of gait and mobility: Secondary | ICD-10-CM | POA: Diagnosis not present

## 2022-10-25 ENCOUNTER — Other Ambulatory Visit: Payer: Self-pay | Admitting: Internal Medicine

## 2022-10-29 DIAGNOSIS — H353221 Exudative age-related macular degeneration, left eye, with active choroidal neovascularization: Secondary | ICD-10-CM | POA: Diagnosis not present

## 2022-10-30 DIAGNOSIS — M1711 Unilateral primary osteoarthritis, right knee: Secondary | ICD-10-CM | POA: Diagnosis not present

## 2022-10-30 DIAGNOSIS — M25561 Pain in right knee: Secondary | ICD-10-CM | POA: Diagnosis not present

## 2022-10-30 DIAGNOSIS — I7091 Generalized atherosclerosis: Secondary | ICD-10-CM | POA: Diagnosis not present

## 2022-10-30 DIAGNOSIS — M6281 Muscle weakness (generalized): Secondary | ICD-10-CM | POA: Diagnosis not present

## 2022-10-30 DIAGNOSIS — R2689 Other abnormalities of gait and mobility: Secondary | ICD-10-CM | POA: Diagnosis not present

## 2022-10-30 DIAGNOSIS — B351 Tinea unguium: Secondary | ICD-10-CM | POA: Diagnosis not present

## 2022-10-31 DIAGNOSIS — R2689 Other abnormalities of gait and mobility: Secondary | ICD-10-CM | POA: Diagnosis not present

## 2022-10-31 DIAGNOSIS — M1711 Unilateral primary osteoarthritis, right knee: Secondary | ICD-10-CM | POA: Diagnosis not present

## 2022-10-31 DIAGNOSIS — M6281 Muscle weakness (generalized): Secondary | ICD-10-CM | POA: Diagnosis not present

## 2022-10-31 DIAGNOSIS — M25561 Pain in right knee: Secondary | ICD-10-CM | POA: Diagnosis not present

## 2022-11-01 DIAGNOSIS — M6281 Muscle weakness (generalized): Secondary | ICD-10-CM | POA: Diagnosis not present

## 2022-11-01 DIAGNOSIS — R2689 Other abnormalities of gait and mobility: Secondary | ICD-10-CM | POA: Diagnosis not present

## 2022-11-01 DIAGNOSIS — M25561 Pain in right knee: Secondary | ICD-10-CM | POA: Diagnosis not present

## 2022-11-01 DIAGNOSIS — M1711 Unilateral primary osteoarthritis, right knee: Secondary | ICD-10-CM | POA: Diagnosis not present

## 2022-11-05 DIAGNOSIS — M6281 Muscle weakness (generalized): Secondary | ICD-10-CM | POA: Diagnosis not present

## 2022-11-05 DIAGNOSIS — M25561 Pain in right knee: Secondary | ICD-10-CM | POA: Diagnosis not present

## 2022-11-05 DIAGNOSIS — M1711 Unilateral primary osteoarthritis, right knee: Secondary | ICD-10-CM | POA: Diagnosis not present

## 2022-11-05 DIAGNOSIS — R2689 Other abnormalities of gait and mobility: Secondary | ICD-10-CM | POA: Diagnosis not present

## 2022-11-07 DIAGNOSIS — M1711 Unilateral primary osteoarthritis, right knee: Secondary | ICD-10-CM | POA: Diagnosis not present

## 2022-11-07 DIAGNOSIS — M25561 Pain in right knee: Secondary | ICD-10-CM | POA: Diagnosis not present

## 2022-11-07 DIAGNOSIS — M6281 Muscle weakness (generalized): Secondary | ICD-10-CM | POA: Diagnosis not present

## 2022-11-07 DIAGNOSIS — R2689 Other abnormalities of gait and mobility: Secondary | ICD-10-CM | POA: Diagnosis not present

## 2022-11-09 DIAGNOSIS — M25561 Pain in right knee: Secondary | ICD-10-CM | POA: Diagnosis not present

## 2022-11-09 DIAGNOSIS — R2689 Other abnormalities of gait and mobility: Secondary | ICD-10-CM | POA: Diagnosis not present

## 2022-11-09 DIAGNOSIS — M6281 Muscle weakness (generalized): Secondary | ICD-10-CM | POA: Diagnosis not present

## 2022-11-09 DIAGNOSIS — M1711 Unilateral primary osteoarthritis, right knee: Secondary | ICD-10-CM | POA: Diagnosis not present

## 2022-11-12 DIAGNOSIS — M6281 Muscle weakness (generalized): Secondary | ICD-10-CM | POA: Diagnosis not present

## 2022-11-12 DIAGNOSIS — M25561 Pain in right knee: Secondary | ICD-10-CM | POA: Diagnosis not present

## 2022-11-12 DIAGNOSIS — R2689 Other abnormalities of gait and mobility: Secondary | ICD-10-CM | POA: Diagnosis not present

## 2022-11-12 DIAGNOSIS — M1711 Unilateral primary osteoarthritis, right knee: Secondary | ICD-10-CM | POA: Diagnosis not present

## 2022-11-14 DIAGNOSIS — M6281 Muscle weakness (generalized): Secondary | ICD-10-CM | POA: Diagnosis not present

## 2022-11-14 DIAGNOSIS — R2689 Other abnormalities of gait and mobility: Secondary | ICD-10-CM | POA: Diagnosis not present

## 2022-11-14 DIAGNOSIS — M25561 Pain in right knee: Secondary | ICD-10-CM | POA: Diagnosis not present

## 2022-11-14 DIAGNOSIS — M1711 Unilateral primary osteoarthritis, right knee: Secondary | ICD-10-CM | POA: Diagnosis not present

## 2022-11-16 DIAGNOSIS — M25561 Pain in right knee: Secondary | ICD-10-CM | POA: Diagnosis not present

## 2022-11-16 DIAGNOSIS — M1711 Unilateral primary osteoarthritis, right knee: Secondary | ICD-10-CM | POA: Diagnosis not present

## 2022-11-16 DIAGNOSIS — M6281 Muscle weakness (generalized): Secondary | ICD-10-CM | POA: Diagnosis not present

## 2022-11-16 DIAGNOSIS — R2689 Other abnormalities of gait and mobility: Secondary | ICD-10-CM | POA: Diagnosis not present

## 2022-11-19 DIAGNOSIS — R2689 Other abnormalities of gait and mobility: Secondary | ICD-10-CM | POA: Diagnosis not present

## 2022-11-19 DIAGNOSIS — M6281 Muscle weakness (generalized): Secondary | ICD-10-CM | POA: Diagnosis not present

## 2022-11-19 DIAGNOSIS — M1711 Unilateral primary osteoarthritis, right knee: Secondary | ICD-10-CM | POA: Diagnosis not present

## 2022-11-19 DIAGNOSIS — M25561 Pain in right knee: Secondary | ICD-10-CM | POA: Diagnosis not present

## 2022-11-21 DIAGNOSIS — R2689 Other abnormalities of gait and mobility: Secondary | ICD-10-CM | POA: Diagnosis not present

## 2022-11-21 DIAGNOSIS — M25561 Pain in right knee: Secondary | ICD-10-CM | POA: Diagnosis not present

## 2022-11-21 DIAGNOSIS — M6281 Muscle weakness (generalized): Secondary | ICD-10-CM | POA: Diagnosis not present

## 2022-11-21 DIAGNOSIS — M1711 Unilateral primary osteoarthritis, right knee: Secondary | ICD-10-CM | POA: Diagnosis not present

## 2022-11-23 DIAGNOSIS — R2689 Other abnormalities of gait and mobility: Secondary | ICD-10-CM | POA: Diagnosis not present

## 2022-11-23 DIAGNOSIS — M25561 Pain in right knee: Secondary | ICD-10-CM | POA: Diagnosis not present

## 2022-11-23 DIAGNOSIS — M1711 Unilateral primary osteoarthritis, right knee: Secondary | ICD-10-CM | POA: Diagnosis not present

## 2022-11-23 DIAGNOSIS — M6281 Muscle weakness (generalized): Secondary | ICD-10-CM | POA: Diagnosis not present

## 2022-11-28 DIAGNOSIS — M1711 Unilateral primary osteoarthritis, right knee: Secondary | ICD-10-CM | POA: Diagnosis not present

## 2022-11-28 DIAGNOSIS — R2689 Other abnormalities of gait and mobility: Secondary | ICD-10-CM | POA: Diagnosis not present

## 2022-11-28 DIAGNOSIS — M25561 Pain in right knee: Secondary | ICD-10-CM | POA: Diagnosis not present

## 2022-11-28 DIAGNOSIS — M6281 Muscle weakness (generalized): Secondary | ICD-10-CM | POA: Diagnosis not present

## 2022-11-30 DIAGNOSIS — L905 Scar conditions and fibrosis of skin: Secondary | ICD-10-CM | POA: Diagnosis not present

## 2022-11-30 DIAGNOSIS — R2689 Other abnormalities of gait and mobility: Secondary | ICD-10-CM | POA: Diagnosis not present

## 2022-11-30 DIAGNOSIS — M25561 Pain in right knee: Secondary | ICD-10-CM | POA: Diagnosis not present

## 2022-11-30 DIAGNOSIS — L814 Other melanin hyperpigmentation: Secondary | ICD-10-CM | POA: Diagnosis not present

## 2022-11-30 DIAGNOSIS — L219 Seborrheic dermatitis, unspecified: Secondary | ICD-10-CM | POA: Diagnosis not present

## 2022-11-30 DIAGNOSIS — M1711 Unilateral primary osteoarthritis, right knee: Secondary | ICD-10-CM | POA: Diagnosis not present

## 2022-11-30 DIAGNOSIS — M6281 Muscle weakness (generalized): Secondary | ICD-10-CM | POA: Diagnosis not present

## 2022-11-30 DIAGNOSIS — H401131 Primary open-angle glaucoma, bilateral, mild stage: Secondary | ICD-10-CM | POA: Diagnosis not present

## 2022-11-30 DIAGNOSIS — L570 Actinic keratosis: Secondary | ICD-10-CM | POA: Diagnosis not present

## 2022-12-05 DIAGNOSIS — M25561 Pain in right knee: Secondary | ICD-10-CM | POA: Diagnosis not present

## 2022-12-05 DIAGNOSIS — R2689 Other abnormalities of gait and mobility: Secondary | ICD-10-CM | POA: Diagnosis not present

## 2022-12-05 DIAGNOSIS — M6281 Muscle weakness (generalized): Secondary | ICD-10-CM | POA: Diagnosis not present

## 2022-12-05 DIAGNOSIS — M1711 Unilateral primary osteoarthritis, right knee: Secondary | ICD-10-CM | POA: Diagnosis not present

## 2022-12-07 DIAGNOSIS — M6281 Muscle weakness (generalized): Secondary | ICD-10-CM | POA: Diagnosis not present

## 2022-12-07 DIAGNOSIS — M25561 Pain in right knee: Secondary | ICD-10-CM | POA: Diagnosis not present

## 2022-12-07 DIAGNOSIS — R2689 Other abnormalities of gait and mobility: Secondary | ICD-10-CM | POA: Diagnosis not present

## 2022-12-07 DIAGNOSIS — M1711 Unilateral primary osteoarthritis, right knee: Secondary | ICD-10-CM | POA: Diagnosis not present

## 2022-12-10 DIAGNOSIS — R2689 Other abnormalities of gait and mobility: Secondary | ICD-10-CM | POA: Diagnosis not present

## 2022-12-10 DIAGNOSIS — M6281 Muscle weakness (generalized): Secondary | ICD-10-CM | POA: Diagnosis not present

## 2022-12-10 DIAGNOSIS — M1711 Unilateral primary osteoarthritis, right knee: Secondary | ICD-10-CM | POA: Diagnosis not present

## 2022-12-10 DIAGNOSIS — M25561 Pain in right knee: Secondary | ICD-10-CM | POA: Diagnosis not present

## 2022-12-12 DIAGNOSIS — M6281 Muscle weakness (generalized): Secondary | ICD-10-CM | POA: Diagnosis not present

## 2022-12-12 DIAGNOSIS — M25561 Pain in right knee: Secondary | ICD-10-CM | POA: Diagnosis not present

## 2022-12-12 DIAGNOSIS — R2689 Other abnormalities of gait and mobility: Secondary | ICD-10-CM | POA: Diagnosis not present

## 2022-12-12 DIAGNOSIS — M1711 Unilateral primary osteoarthritis, right knee: Secondary | ICD-10-CM | POA: Diagnosis not present

## 2022-12-14 DIAGNOSIS — R2689 Other abnormalities of gait and mobility: Secondary | ICD-10-CM | POA: Diagnosis not present

## 2022-12-14 DIAGNOSIS — M6281 Muscle weakness (generalized): Secondary | ICD-10-CM | POA: Diagnosis not present

## 2022-12-14 DIAGNOSIS — M25561 Pain in right knee: Secondary | ICD-10-CM | POA: Diagnosis not present

## 2022-12-14 DIAGNOSIS — M1711 Unilateral primary osteoarthritis, right knee: Secondary | ICD-10-CM | POA: Diagnosis not present

## 2022-12-17 DIAGNOSIS — R2689 Other abnormalities of gait and mobility: Secondary | ICD-10-CM | POA: Diagnosis not present

## 2022-12-17 DIAGNOSIS — M6281 Muscle weakness (generalized): Secondary | ICD-10-CM | POA: Diagnosis not present

## 2022-12-17 DIAGNOSIS — M25561 Pain in right knee: Secondary | ICD-10-CM | POA: Diagnosis not present

## 2022-12-17 DIAGNOSIS — M1711 Unilateral primary osteoarthritis, right knee: Secondary | ICD-10-CM | POA: Diagnosis not present

## 2022-12-21 DIAGNOSIS — M25561 Pain in right knee: Secondary | ICD-10-CM | POA: Diagnosis not present

## 2022-12-21 DIAGNOSIS — M6281 Muscle weakness (generalized): Secondary | ICD-10-CM | POA: Diagnosis not present

## 2022-12-21 DIAGNOSIS — M1711 Unilateral primary osteoarthritis, right knee: Secondary | ICD-10-CM | POA: Diagnosis not present

## 2022-12-21 DIAGNOSIS — R2689 Other abnormalities of gait and mobility: Secondary | ICD-10-CM | POA: Diagnosis not present

## 2022-12-24 ENCOUNTER — Ambulatory Visit: Payer: Medicare Other | Admitting: Student

## 2022-12-24 ENCOUNTER — Encounter: Payer: Self-pay | Admitting: Student

## 2022-12-24 VITALS — BP 124/82 | HR 77 | Temp 97.2°F | Ht 66.5 in | Wt 185.0 lb

## 2022-12-24 DIAGNOSIS — M1711 Unilateral primary osteoarthritis, right knee: Secondary | ICD-10-CM | POA: Diagnosis not present

## 2022-12-24 DIAGNOSIS — F3342 Major depressive disorder, recurrent, in full remission: Secondary | ICD-10-CM

## 2022-12-24 DIAGNOSIS — N1831 Chronic kidney disease, stage 3a: Secondary | ICD-10-CM

## 2022-12-24 DIAGNOSIS — M6281 Muscle weakness (generalized): Secondary | ICD-10-CM

## 2022-12-24 DIAGNOSIS — R269 Unspecified abnormalities of gait and mobility: Secondary | ICD-10-CM | POA: Diagnosis not present

## 2022-12-24 DIAGNOSIS — M25561 Pain in right knee: Secondary | ICD-10-CM | POA: Diagnosis not present

## 2022-12-24 DIAGNOSIS — R2689 Other abnormalities of gait and mobility: Secondary | ICD-10-CM | POA: Diagnosis not present

## 2022-12-24 NOTE — Progress Notes (Signed)
Location:  [facility]   Provider: Unk Lightning  Code Status: DNR Goals of Care:     12/24/2022    2:06 PM  Advanced Directives  Does Patient Have a Medical Advance Directive? Yes  Type of Paramedic of [place];Out of facility DNR (pink MOST or yellow form);Living will  Does patient want to make changes to medical advance directive? No - Patient declined  Copy of [place] in Chart? No - copy requested     Chief Complaint  Patient presents with   Medical Management of Chronic Issues    Medical Management of Chronic Issues. 3 Month follow up    HPI: Patient is a 89 y.o. male seen today for medical management of chronic diseases.     He  has improved in his balance. It has not helped his right knee pain, but his balance is improved.   He has some exercises that he will be abel to continue at home.   The higher dose of sertraline has helped his mood> he hasn't felt that way in a while.   He is due for next round of eye injections in March.   He has no other concerns at this time. He has Blueberries with his breakfast and he recently started.   They enjoy peace and quite together for the holidays. He feels like he has to shout more because of wife's difficulty hearing.  Past Medical History:  Diagnosis Date   Actinic keratosis    Anxiety    BCC (basal cell carcinoma of skin) 07/24/2021   Left upper forehead, EDC   Cancer ([place]) 2010   Prostate Cancer   Cataract    left eye   Chronic renal disease, stage III ([place])    Chronic venous insufficiency    Clotting disorder ([place]) 2013   blood clot 3 days post knee surgery   Diverticulosis of colon    DVT (deep venous thrombosis) ([place]) 2012   after knee replacement   Glaucoma    HLD (hyperlipidemia)    Hx of basal cell carcinoma 12/23/2018   L nasal tip   Hx of basal cell carcinoma 12/23/2018   R preauricular   Hx of colonic polyp    Macular degeneration    legally blind in  right eye   OA (osteoarthritis)    Personal history of prostate cancer    Pulmonary embolism ([place]) 10/2012   post op TKR   PVC (premature ventricular contraction)    Spinal stenosis of lumbar region    Squamous cell carcinoma of skin 05/06/2018   L dorsal forearm near anticubital    Past Surgical History:  Procedure Laterality Date   APPENDECTOMY     CATARACT EXTRACTION W/PHACO Left 06/24/2019   Procedure: CATARACT EXTRACTION PHACO AND INTRAOCULAR LENS PLACEMENT ([place]) LEFT;  Surgeon: Leandrew Koyanagi, MD;  Location: [facility];  Service: Ophthalmology;  Laterality: Left;   COLONOSCOPY  2011   INGUINAL HERNIA REPAIR     left   INGUINAL HERNIA REPAIR  5/12   Dr Priscille Heidelberg   INGUINAL HERNIA REPAIR  5/12   Dr Jamal Collin did redo of this   JOINT REPLACEMENT  11/13   Left total knee--[facility]   KNEE SURGERY  2013   POLYPECTOMY  2011   PROSTATECTOMY  2010   TONSILLECTOMY     VARICOSE VEIN SURGERY     left    Allergies  Allergen Reactions   Naphazoline-Polyethyl Glycol Other (See Comments)    (  Afgan) redness    Outpatient Encounter Medications as of 12/24/2022  Medication Sig   Ascorbic Acid (VITAMIN C PO) Take by mouth daily.   Cholecalciferol (VITAMIN D3 PO) Take 1,000 Units by mouth daily.   hydrocortisone 2.5 % cream Apply topically 2 (two) times daily as needed.   hydroxypropyl methylcellulose / hypromellose (ISOPTO TEARS / GONIOVISC) 2.5 % ophthalmic solution 1 drop as needed for dry eyes.   latanoprost (XALATAN) 0.005 % ophthalmic solution Place 1 drop into both eyes at bedtime.   Multiple Vitamins-Minerals (PRESERVISION AREDS 2 PO) Take 2 tablets by mouth daily.    pravastatin (PRAVACHOL) 20 MG tablet TAKE 1 TABLET DAILY   sertraline (ZOLOFT) 50 MG tablet Take 2 tablets (100 mg total) by mouth daily.   No facility-administered encounter medications on file as of 12/24/2022.    Review of Systems:  Review of Systems  Health Maintenance  Topic Date Due    DTaP/Tdap/Td (2 - Tdap) 10/24/2020   COVID-19 Vaccine (5 - 2023-24 season) 08/03/2022   Medicare Annual Wellness (AWV)  05/30/2023   Pneumonia Vaccine 72+ Years old  Completed   INFLUENZA VACCINE  Completed   Zoster Vaccines- Shingrix  Completed   HPV VACCINES  Aged Out    Physical Exam: Vitals:   12/24/22 1405  BP: 124/82  Pulse: 77  Temp: (!) 97.2 F (36.2 C)  SpO2: 96%  Weight: 185 lb (83.9 kg)  Height: 5' 6.5" (1.689 m)   Body mass index is 29.41 kg/m. Physical Exam Vitals reviewed.  Cardiovascular:     Rate and Rhythm: Normal rate and regular rhythm.  Pulmonary:     Effort: Pulmonary effort is normal.  Musculoskeletal:     Comments: Left leg w/o full extension with ambulation.   Neurological:     Mental Status: He is alert and oriented to person, place, and time.     Labs reviewed: Basic Metabolic Panel: Recent Labs    05/29/22 1235 09/24/22 0000  NA 137 140  K 4.2 3.9  CL 101 103  CO2 27 27*  GLUCOSE 101*  --   BUN 23 1*  CREATININE 1.45 1.3  CALCIUM 9.0 8.8  PHOS 3.5  --    Liver Function Tests: Recent Labs    05/29/22 1235  AST 21  ALT 28  ALKPHOS 84  BILITOT 0.5  PROT 7.6  ALBUMIN 4.1  4.1   No results for input(s): "LIPASE", "AMYLASE" in the last 8760 hours. No results for input(s): "AMMONIA" in the last 8760 hours. CBC: Recent Labs    05/29/22 1235 09/24/22 0000  WBC 7.7 8.3  HGB 12.4* 14.3  HCT 38.9* 43  MCV 83.0  --   PLT 205.0 215   Lipid Panel: Recent Labs    05/29/22 1235  CHOL 181  HDL 42.10  TRIG 275.0*  CHOLHDL 4  LDLDIRECT 113.0   No results found for: "HGBA1C"  Procedures since last visit: No results found.  Assessment/Plan MDD (major depressive disorder), recurrent, in full remission ([place])  Stage 3a chronic kidney disease ([place])  Primary osteoarthritis of right knee  Abnormal gait due to muscle weakness Patient's mood increased on higher dose. Will continue until reevaluation October 2024. CKD  stable per most recent labs. Continues to have pain in right knee, no interventions at this time. Gait and strength improved after PT/OT. No recent falls. Continues to use scooter and 2-wheel walker. Will follow up in 6 months for AWV. BMP and CBC prior to next appointment.  Labs/tests ordered:  * No order type specified * Next appt:  Visit date not found

## 2022-12-25 DIAGNOSIS — I493 Ventricular premature depolarization: Secondary | ICD-10-CM | POA: Diagnosis not present

## 2022-12-25 DIAGNOSIS — E785 Hyperlipidemia, unspecified: Secondary | ICD-10-CM | POA: Diagnosis not present

## 2022-12-26 DIAGNOSIS — M6281 Muscle weakness (generalized): Secondary | ICD-10-CM | POA: Diagnosis not present

## 2022-12-26 DIAGNOSIS — R2689 Other abnormalities of gait and mobility: Secondary | ICD-10-CM | POA: Diagnosis not present

## 2022-12-26 DIAGNOSIS — M25561 Pain in right knee: Secondary | ICD-10-CM | POA: Diagnosis not present

## 2022-12-26 DIAGNOSIS — M1711 Unilateral primary osteoarthritis, right knee: Secondary | ICD-10-CM | POA: Diagnosis not present

## 2022-12-28 DIAGNOSIS — M1711 Unilateral primary osteoarthritis, right knee: Secondary | ICD-10-CM | POA: Diagnosis not present

## 2022-12-28 DIAGNOSIS — R2689 Other abnormalities of gait and mobility: Secondary | ICD-10-CM | POA: Diagnosis not present

## 2022-12-28 DIAGNOSIS — M6281 Muscle weakness (generalized): Secondary | ICD-10-CM | POA: Diagnosis not present

## 2022-12-28 DIAGNOSIS — M25561 Pain in right knee: Secondary | ICD-10-CM | POA: Diagnosis not present

## 2022-12-31 DIAGNOSIS — M6281 Muscle weakness (generalized): Secondary | ICD-10-CM | POA: Diagnosis not present

## 2022-12-31 DIAGNOSIS — M25561 Pain in right knee: Secondary | ICD-10-CM | POA: Diagnosis not present

## 2022-12-31 DIAGNOSIS — R2689 Other abnormalities of gait and mobility: Secondary | ICD-10-CM | POA: Diagnosis not present

## 2022-12-31 DIAGNOSIS — M1711 Unilateral primary osteoarthritis, right knee: Secondary | ICD-10-CM | POA: Diagnosis not present

## 2023-01-01 DIAGNOSIS — B351 Tinea unguium: Secondary | ICD-10-CM | POA: Diagnosis not present

## 2023-01-01 DIAGNOSIS — I7091 Generalized atherosclerosis: Secondary | ICD-10-CM | POA: Diagnosis not present

## 2023-01-02 DIAGNOSIS — M25561 Pain in right knee: Secondary | ICD-10-CM | POA: Diagnosis not present

## 2023-01-02 DIAGNOSIS — R2689 Other abnormalities of gait and mobility: Secondary | ICD-10-CM | POA: Diagnosis not present

## 2023-01-02 DIAGNOSIS — M6281 Muscle weakness (generalized): Secondary | ICD-10-CM | POA: Diagnosis not present

## 2023-01-02 DIAGNOSIS — M1711 Unilateral primary osteoarthritis, right knee: Secondary | ICD-10-CM | POA: Diagnosis not present

## 2023-01-24 DIAGNOSIS — H01003 Unspecified blepharitis right eye, unspecified eyelid: Secondary | ICD-10-CM | POA: Diagnosis not present

## 2023-01-24 DIAGNOSIS — H401131 Primary open-angle glaucoma, bilateral, mild stage: Secondary | ICD-10-CM | POA: Diagnosis not present

## 2023-01-24 DIAGNOSIS — S0502XD Injury of conjunctiva and corneal abrasion without foreign body, left eye, subsequent encounter: Secondary | ICD-10-CM | POA: Diagnosis not present

## 2023-01-24 DIAGNOSIS — Z961 Presence of intraocular lens: Secondary | ICD-10-CM | POA: Diagnosis not present

## 2023-01-24 DIAGNOSIS — S0502XA Injury of conjunctiva and corneal abrasion without foreign body, left eye, initial encounter: Secondary | ICD-10-CM | POA: Diagnosis not present

## 2023-01-26 DIAGNOSIS — S0502XA Injury of conjunctiva and corneal abrasion without foreign body, left eye, initial encounter: Secondary | ICD-10-CM | POA: Diagnosis not present

## 2023-01-26 DIAGNOSIS — H01003 Unspecified blepharitis right eye, unspecified eyelid: Secondary | ICD-10-CM | POA: Diagnosis not present

## 2023-01-26 DIAGNOSIS — S0502XD Injury of conjunctiva and corneal abrasion without foreign body, left eye, subsequent encounter: Secondary | ICD-10-CM | POA: Diagnosis not present

## 2023-01-26 DIAGNOSIS — H401131 Primary open-angle glaucoma, bilateral, mild stage: Secondary | ICD-10-CM | POA: Diagnosis not present

## 2023-01-26 DIAGNOSIS — Z961 Presence of intraocular lens: Secondary | ICD-10-CM | POA: Diagnosis not present

## 2023-01-31 DIAGNOSIS — S0502XD Injury of conjunctiva and corneal abrasion without foreign body, left eye, subsequent encounter: Secondary | ICD-10-CM | POA: Diagnosis not present

## 2023-01-31 DIAGNOSIS — H01003 Unspecified blepharitis right eye, unspecified eyelid: Secondary | ICD-10-CM | POA: Diagnosis not present

## 2023-01-31 DIAGNOSIS — H401131 Primary open-angle glaucoma, bilateral, mild stage: Secondary | ICD-10-CM | POA: Diagnosis not present

## 2023-01-31 DIAGNOSIS — Z961 Presence of intraocular lens: Secondary | ICD-10-CM | POA: Diagnosis not present

## 2023-01-31 DIAGNOSIS — S0502XA Injury of conjunctiva and corneal abrasion without foreign body, left eye, initial encounter: Secondary | ICD-10-CM | POA: Diagnosis not present

## 2023-02-04 DIAGNOSIS — H01003 Unspecified blepharitis right eye, unspecified eyelid: Secondary | ICD-10-CM | POA: Diagnosis not present

## 2023-02-04 DIAGNOSIS — S0502XD Injury of conjunctiva and corneal abrasion without foreign body, left eye, subsequent encounter: Secondary | ICD-10-CM | POA: Diagnosis not present

## 2023-02-04 DIAGNOSIS — Z961 Presence of intraocular lens: Secondary | ICD-10-CM | POA: Diagnosis not present

## 2023-02-04 DIAGNOSIS — H401131 Primary open-angle glaucoma, bilateral, mild stage: Secondary | ICD-10-CM | POA: Diagnosis not present

## 2023-02-04 DIAGNOSIS — S0502XA Injury of conjunctiva and corneal abrasion without foreign body, left eye, initial encounter: Secondary | ICD-10-CM | POA: Diagnosis not present

## 2023-02-11 DIAGNOSIS — H353221 Exudative age-related macular degeneration, left eye, with active choroidal neovascularization: Secondary | ICD-10-CM | POA: Diagnosis not present

## 2023-02-18 DIAGNOSIS — S0502XD Injury of conjunctiva and corneal abrasion without foreign body, left eye, subsequent encounter: Secondary | ICD-10-CM | POA: Diagnosis not present

## 2023-02-18 DIAGNOSIS — H401131 Primary open-angle glaucoma, bilateral, mild stage: Secondary | ICD-10-CM | POA: Diagnosis not present

## 2023-03-12 DIAGNOSIS — Z23 Encounter for immunization: Secondary | ICD-10-CM | POA: Diagnosis not present

## 2023-03-14 DIAGNOSIS — I7091 Generalized atherosclerosis: Secondary | ICD-10-CM | POA: Diagnosis not present

## 2023-03-14 DIAGNOSIS — B351 Tinea unguium: Secondary | ICD-10-CM | POA: Diagnosis not present

## 2023-03-25 ENCOUNTER — Telehealth: Payer: Self-pay | Admitting: *Deleted

## 2023-03-25 NOTE — Telephone Encounter (Signed)
Wife, Enid Derry, brought in Handicap Placard at her appointment for Dr. Sydnee Cabal to sign for patient.   Placard filled out and signed. Copy made to send for scanning.   Original given to patient's wife.

## 2023-04-18 DIAGNOSIS — L905 Scar conditions and fibrosis of skin: Secondary | ICD-10-CM | POA: Diagnosis not present

## 2023-04-18 DIAGNOSIS — L219 Seborrheic dermatitis, unspecified: Secondary | ICD-10-CM | POA: Diagnosis not present

## 2023-04-23 ENCOUNTER — Other Ambulatory Visit: Payer: Self-pay | Admitting: Internal Medicine

## 2023-04-24 ENCOUNTER — Other Ambulatory Visit: Payer: Self-pay

## 2023-04-24 MED ORDER — PRAVASTATIN SODIUM 20 MG PO TABS: 20.0000 mg | ORAL_TABLET | Freq: Every day | ORAL | 1 refills | Status: DC

## 2023-05-29 DIAGNOSIS — I7091 Generalized atherosclerosis: Secondary | ICD-10-CM | POA: Diagnosis not present

## 2023-05-29 DIAGNOSIS — B351 Tinea unguium: Secondary | ICD-10-CM | POA: Diagnosis not present

## 2023-05-30 DIAGNOSIS — H401131 Primary open-angle glaucoma, bilateral, mild stage: Secondary | ICD-10-CM | POA: Diagnosis not present

## 2023-06-03 ENCOUNTER — Encounter: Payer: Medicare Other | Admitting: Internal Medicine

## 2023-06-03 DIAGNOSIS — H353221 Exudative age-related macular degeneration, left eye, with active choroidal neovascularization: Secondary | ICD-10-CM | POA: Diagnosis not present

## 2023-06-10 ENCOUNTER — Other Ambulatory Visit: Payer: Self-pay | Admitting: Student

## 2023-06-10 ENCOUNTER — Encounter: Payer: Self-pay | Admitting: Student

## 2023-06-10 DIAGNOSIS — H353221 Exudative age-related macular degeneration, left eye, with active choroidal neovascularization: Secondary | ICD-10-CM | POA: Diagnosis not present

## 2023-06-10 DIAGNOSIS — F3342 Major depressive disorder, recurrent, in full remission: Secondary | ICD-10-CM

## 2023-06-10 DIAGNOSIS — H401131 Primary open-angle glaucoma, bilateral, mild stage: Secondary | ICD-10-CM | POA: Diagnosis not present

## 2023-06-10 MED ORDER — SERTRALINE HCL 100 MG PO TABS: 100.0000 mg | ORAL_TABLET | Freq: Every day | ORAL | 3 refills | Status: DC

## 2023-06-10 NOTE — Progress Notes (Signed)
Refill of anxiety medication

## 2023-06-12 ENCOUNTER — Other Ambulatory Visit: Payer: Self-pay | Admitting: *Deleted

## 2023-06-12 DIAGNOSIS — E785 Hyperlipidemia, unspecified: Secondary | ICD-10-CM

## 2023-06-12 DIAGNOSIS — N1831 Chronic kidney disease, stage 3a: Secondary | ICD-10-CM

## 2023-06-20 DIAGNOSIS — E785 Hyperlipidemia, unspecified: Secondary | ICD-10-CM | POA: Diagnosis not present

## 2023-06-20 DIAGNOSIS — N1831 Chronic kidney disease, stage 3a: Secondary | ICD-10-CM | POA: Diagnosis not present

## 2023-06-20 DIAGNOSIS — L821 Other seborrheic keratosis: Secondary | ICD-10-CM | POA: Diagnosis not present

## 2023-06-20 DIAGNOSIS — L219 Seborrheic dermatitis, unspecified: Secondary | ICD-10-CM | POA: Diagnosis not present

## 2023-06-20 LAB — BASIC METABOLIC PANEL
BUN/Creatinine Ratio: 17 (calc) (ref 6–22)
BUN: 24 mg/dL (ref 7–25)
CO2: 28 mmol/L (ref 20–32)
Calcium: 9.2 mg/dL (ref 8.6–10.3)
Chloride: 102 mmol/L (ref 98–110)
Creat: 1.41 mg/dL — ABNORMAL HIGH (ref 0.70–1.22)
Glucose, Bld: 158 mg/dL — ABNORMAL HIGH (ref 65–99)
Potassium: 4.1 mmol/L (ref 3.5–5.3)
Sodium: 138 mmol/L (ref 135–146)

## 2023-06-20 LAB — CBC WITH DIFFERENTIAL/PLATELET
Absolute Monocytes: 454 cells/uL (ref 200–950)
Basophils Absolute: 28 cells/uL (ref 0–200)
Basophils Relative: 0.4 %
Eosinophils Absolute: 178 cells/uL (ref 15–500)
Eosinophils Relative: 2.5 %
HCT: 42.6 % (ref 38.5–50.0)
Hemoglobin: 14 g/dL (ref 13.2–17.1)
Lymphs Abs: 1889 cells/uL (ref 850–3900)
MCH: 29.5 pg (ref 27.0–33.0)
MCHC: 32.9 g/dL (ref 32.0–36.0)
MCV: 89.9 fL (ref 80.0–100.0)
MPV: 10.8 fL (ref 7.5–12.5)
Monocytes Relative: 6.4 %
Neutro Abs: 4551 cells/uL (ref 1500–7800)
Neutrophils Relative %: 64.1 %
Platelets: 182 10*3/uL (ref 140–400)
RBC: 4.74 10*6/uL (ref 4.20–5.80)
RDW: 12.2 % (ref 11.0–15.0)
Total Lymphocyte: 26.6 %
WBC: 7.1 10*3/uL (ref 3.8–10.8)

## 2023-06-24 ENCOUNTER — Telehealth (INDEPENDENT_AMBULATORY_CARE_PROVIDER_SITE_OTHER): Payer: Medicare Other | Admitting: Student

## 2023-06-24 ENCOUNTER — Encounter: Payer: Medicare Other | Admitting: Student

## 2023-06-24 DIAGNOSIS — N1831 Chronic kidney disease, stage 3a: Secondary | ICD-10-CM

## 2023-06-24 NOTE — Telephone Encounter (Signed)
Discussed patient's most recent labs which are normal. Discussed importance of hydration.   He's wondering about his knee if he should have minimally invasive surgery.   He's also had some trouble with his left shoulder.   He finished up PT in February. He continues to do some exercise. He goes to the gym 3x per week as well. He has had injections before and it lasted 1 day. He had an ablation of the nerve and the pain control lasted about 6-8 hours.   He has been having so much trouble at the house he had an electric wheelchair.   Patient - his home Provider - TL office  I spent greater than 11 minutes for the care of this patient in telephone time, chart review, clinical documentation, patient education.

## 2023-06-25 ENCOUNTER — Ambulatory Visit (INDEPENDENT_AMBULATORY_CARE_PROVIDER_SITE_OTHER): Payer: Medicare Other | Admitting: Nurse Practitioner

## 2023-06-25 ENCOUNTER — Encounter: Payer: Self-pay | Admitting: Nurse Practitioner

## 2023-06-25 VITALS — BP 122/68 | HR 72 | Temp 98.1°F | Ht 66.5 in | Wt 188.0 lb

## 2023-06-25 DIAGNOSIS — Z0000 Encounter for general adult medical examination without abnormal findings: Secondary | ICD-10-CM

## 2023-06-25 NOTE — Progress Notes (Signed)
Subjective:   Greig Altergott is a 89 y.o. male who presents for Medicare Annual/Subsequent preventive examination.  Visit Complete: In person at [facility]   Patient Medicare AWV questionnaire was completed by the patient on 7/23; I have confirmed that all information answered by patient is correct and no changes since this date.  Review of Systems     Cardiac Risk Factors include: advanced age (>37men, >59 women);dyslipidemia;sedentary lifestyle     Objective:    Today's Vitals   06/25/23 1008  BP: 122/68  Pulse: 72  Temp: 98.1 F (36.7 C)  SpO2: 96%  Weight: 188 lb (85.3 kg)  Height: 5' 6.5" (1.689 m)   Body mass index is 29.89 kg/m.     06/25/2023   10:09 AM 12/24/2022    2:06 PM 09/21/2022    2:08 PM 09/20/2022    3:30 PM 06/24/2019    8:54 AM 01/02/2017   11:07 PM 01/02/2017    2:51 PM  Advanced Directives  Does Patient Have a Medical Advance Directive? Yes Yes Yes Yes Yes Yes No;Yes  Type of Estate agent of [place];Out of facility DNR (pink MOST or yellow form);Living will Healthcare Power of [place];Out of facility DNR (pink MOST or yellow form);Living will Healthcare Power of [place];Out of facility DNR (pink MOST or yellow form) Healthcare Power of [place];Out of facility DNR (pink MOST or yellow form) Healthcare Power of [place];Living will Healthcare Power of [employer] of [place];Living will  Does patient want to make changes to medical advance directive? No - Patient declined No - Patient declined No - Patient declined No - Patient declined No - Patient declined No - Patient declined No - Patient declined  Copy of Healthcare Power of Attorney in Chart? Yes - validated most recent copy scanned in chart (See row information) No - copy requested Yes - validated most recent copy scanned in chart (See row information) Yes - validated most recent copy scanned in chart (See row information) No - copy requested No - copy  requested No - copy requested  Pre-existing out of facility DNR order (yellow form or pink MOST form)   Yellow form placed in chart (order not valid for inpatient use) Yellow form placed in chart (order not valid for inpatient use)       Current Medications (verified) Outpatient Encounter Medications as of 06/25/2023  Medication Sig   acetaminophen (TYLENOL) 500 MG tablet Take 1,000 mg by mouth 2 (two) times daily.   Ascorbic Acid (VITAMIN C PO) Take by mouth daily.   Cholecalciferol (VITAMIN D3 PO) Take 1,000 Units by mouth daily.   hydrocortisone 2.5 % cream Apply topically 2 (two) times daily as needed.   hydroxypropyl methylcellulose / hypromellose (ISOPTO TEARS / GONIOVISC) 2.5 % ophthalmic solution 1 drop as needed for dry eyes.   latanoprost (XALATAN) 0.005 % ophthalmic solution Place 1 drop into both eyes at bedtime.   Multiple Vitamins-Minerals (PRESERVISION AREDS 2 PO) Take 2 tablets by mouth daily.    pravastatin (PRAVACHOL) 20 MG tablet Take 1 tablet (20 mg total) by mouth daily.   sertraline (ZOLOFT) 100 MG tablet Take 1 tablet (100 mg total) by mouth daily.   No facility-administered encounter medications on file as of 06/25/2023.    Allergies (verified) Naphazoline-polyethyl glycol   History: Past Medical History:  Diagnosis Date   Actinic keratosis    Anxiety    BCC (basal cell carcinoma of skin) 07/24/2021   Left upper forehead, EDC  Cancer [facility]) 2010   Prostate Cancer   Cataract    left eye   Chronic renal disease, stage III (HCC)    Chronic venous insufficiency    Clotting disorder (HCC) 2013   blood clot 3 days post knee surgery   Diverticulosis of colon    DVT (deep venous thrombosis) (HCC) 2012   after knee replacement   Glaucoma    HLD (hyperlipidemia)    Hx of basal cell carcinoma 12/23/2018   L nasal tip   Hx of basal cell carcinoma 12/23/2018   R preauricular   Hx of colonic polyp    Macular degeneration    legally blind in right eye   OA  (osteoarthritis)    Personal history of prostate cancer    Pulmonary embolism (HCC) 10/2012   post op TKR   PVC (premature ventricular contraction)    Spinal stenosis of lumbar region    Squamous cell carcinoma of skin 05/06/2018   L dorsal forearm near anticubital   Past Surgical History:  Procedure Laterality Date   APPENDECTOMY     CATARACT EXTRACTION W/PHACO Left 06/24/2019   Procedure: CATARACT EXTRACTION PHACO AND INTRAOCULAR LENS PLACEMENT (IOC) LEFT;  Surgeon: Lockie Mola, MD;  Location: [facility];  Service: Ophthalmology;  Laterality: Left;   COLONOSCOPY  2011   INGUINAL HERNIA REPAIR     left   INGUINAL HERNIA REPAIR  5/12   Dr Beverly Sessions   INGUINAL HERNIA REPAIR  5/12   Dr Evette Cristal did redo of this   JOINT REPLACEMENT  11/13   Left total knee--Dr Hooten   KNEE SURGERY  2013   POLYPECTOMY  2011   PROSTATECTOMY  2010   TONSILLECTOMY     VARICOSE VEIN SURGERY     left   Family History  Problem Relation Age of Onset   Dementia Mother    Stroke Father    Leukemia Sister    Colon cancer Neg Hx    Social History   Socioeconomic History   Marital status: Married    Spouse name: Not on file   Number of children: 0   Years of education: Not on file   Highest education level: Not on file  Occupational History   Occupation: Retired-purchasing for [employer]  Tobacco Use   Smoking status: Former    Current packs/day: 0.00    Types: Cigarettes    Quit date: 12/04/1983    Years since quitting: 39.5   Smokeless tobacco: Never  Vaping Use   Vaping status: Never Used  Substance and Sexual Activity   Alcohol use: Yes    Alcohol/week: 7.0 standard drinks of alcohol    Types: 7 Glasses of wine per week   Drug use: No   Sexual activity: Not on file  Other Topics Concern   Not on file  Social History Narrative   Has living will   DNR done 11/12   Wife is health care POA--then niece Christoper Fabian or niece Carman Ching   No feeding tube if  cognitively unaware   Social Determinants of Health   Financial Resource Strain: Not on file  Food Insecurity: Not on file  Transportation Needs: Not on file  Physical Activity: Not on file  Stress: Not on file  Social Connections: Not on file    Tobacco Counseling Counseling given: Not Answered   Clinical Intake:  Pre-visit preparation completed: Yes  Pain : No/denies pain (no pain at this time but will have pain in his  knees)     BMI - recorded: 29 Nutritional Status: BMI 25 -29 Overweight Nutritional Risks: None Diabetes: No  How often do you need to have someone help you when you read instructions, pamphlets, or other written materials from your doctor or pharmacy?: 1 - Never         Activities of Daily Living    06/25/2023   10:23 AM  In your present state of health, do you have any difficulty performing the following activities:  Hearing? 1  Vision? 0  Difficulty concentrating or making decisions? 0  Walking or climbing stairs? 1  Dressing or bathing? 0  Doing errands, shopping? 0  Preparing Food and eating ? N  Using the Toilet? N  In the past six months, have you accidently leaked urine? Y  Do you have problems with loss of bowel control? N  Managing your Medications? N  Managing your Finances? N  Housekeeping or managing your Housekeeping? Y  Comment wife manages    Patient Care Team: [name], MD as PCP - General (Family Medicine)  Indicate any recent Medical Services you may have received from other than [facility] providers in the past year (date may be approximate).     Assessment:   This is a routine wellness examination for [place].  Hearing/Vision screen Vision Screening - Comments:: Dr. Jaymes Graff [facility] Last Exam: June  Dietary issues and exercise activities discussed:     Goals Addressed             This Visit's Progress    Patient Stated       Enjoy life       Depression Screen    06/25/2023    10:09 AM 09/21/2022    2:06 PM 05/29/2022   12:17 PM 05/19/2021   11:30 AM 05/17/2020   11:17 AM 05/14/2019    3:12 PM 05/06/2018   11:16 AM  PHQ 2/9 Scores  PHQ - 2 Score 0 0 1 0 0 0 0    Fall Risk    06/25/2023   10:08 AM 09/21/2022    2:06 PM 05/29/2022   12:17 PM 05/19/2021   11:30 AM 05/14/2019    3:12 PM  Fall Risk   Falls in the past year? 1 0 0 1 0  Number falls in past yr: 1 0  0   Injury with Fall? 0 0  0   Risk for fall due to :  No Fall Risks     Follow up  Falls evaluation completed       MEDICARE RISK AT HOME:   TIMED UP AND GO:  Was the test performed?  No    Cognitive Function:        06/25/2023   10:12 AM  6CIT Screen  What Year? 0 points  What month? 0 points  What time? 0 points  Count back from 20 0 points  Months in reverse 0 points  Repeat phrase 0 points  Total Score 0 points    Immunizations Immunization History  Administered Date(s) Administered   Covid-19, Mrna,Vaccine(Spikevax)3yrs and older 03/12/2023   Influenza Split 09/17/2011, 09/10/2012   Influenza Whole 10/03/2010   Influenza, High Dose Seasonal PF 09/06/2016, 09/18/2022   Influenza, Seasonal, Injecte, Preservative Fre 10/03/2015   Influenza,inj,Quad PF,6+ Mos 08/13/2017   Influenza-Unspecified 09/02/2014, 09/16/2018, 09/15/2019, 09/02/2020   Moderna Covid-19 Vaccine Bivalent Booster 92yrs & up 08/24/2021, 05/01/2022, 10/12/2022   Moderna Sars-Covid-2 Vaccination 12/15/2019, 01/12/2020, 02/19/2020, 10/18/2020, 04/18/2021   Pneumococcal Conjugate-13  06/09/2014   Pneumococcal Polysaccharide-23 10/30/2005, 05/06/2018   Td 10/24/2010   Tdap 10/06/2022   Typhoid Live 08/17/2003   Zoster Recombinant(Shingrix) 07/23/2019, 10/09/2019   Zoster, Live 10/03/2010    TDAP status: Up to date  Flu Vaccine status: Up to date  Pneumococcal vaccine status: Up to date  Covid-19 vaccine status: Information provided on how to obtain vaccines.   Qualifies for Shingles Vaccine? Yes    Zostavax completed No   Shingrix Completed?: Yes  Screening Tests Health Maintenance  Topic Date Due   COVID-19 Vaccine (10 - 2023-24 season) 05/07/2023   INFLUENZA VACCINE  07/04/2023   Medicare Annual Wellness (AWV)  06/24/2024   DTaP/Tdap/Td (3 - Td or Tdap) 10/06/2032   Pneumonia Vaccine 106+ Years old  Completed   Zoster Vaccines- Shingrix  Completed   HPV VACCINES  Aged Out    Health Maintenance  Health Maintenance Due  Topic Date Due   COVID-19 Vaccine (10 - 2023-24 season) 05/07/2023    Colorectal cancer screening: No longer required.   Lung Cancer Screening: (Low Dose CT Chest recommended if Age 72-80 years, 20 pack-year currently smoking OR have quit w/in 15years.) does not qualify.   Lung Cancer Screening Referral: na  Additional Screening:  Hepatitis C Screening: does not qualify; Completed  Vision Screening: Recommended annual ophthalmology exams for early detection of glaucoma and other disorders of the eye. Is the patient up to date with their annual eye exam?  Yes  Who is the provider or what is the name of the office in which the patient attends annual eye exams? [facility] If pt is not established with a provider, would they like to be referred to a provider to establish care? No .   Dental Screening: Recommended annual dental exams for proper oral hygiene   Community Resource Referral / Chronic Care Management: CRR required this visit?  No   CCM required this visit?  No     Plan:     I have personally reviewed and noted the following in the patient's chart:   Medical and social history Use of alcohol, tobacco or illicit drugs  Current medications and supplements including opioid prescriptions. Patient is not currently taking opioid prescriptions. Functional ability and status Nutritional status Physical activity Advanced directives List of other physicians Hospitalizations, surgeries, and ER visits in previous 12  months Vitals Screenings to include cognitive, depression, and falls Referrals and appointments  In addition, I have reviewed and discussed with patient certain preventive protocols, quality metrics, and best practice recommendations. A written personalized care plan for preventive services as well as general preventive health recommendations were provided to patient.     Sharon Seller, NP   06/25/2023

## 2023-06-25 NOTE — Patient Instructions (Signed)
  Juan Horn , Thank you for taking time to come for your Medicare Wellness Visit. I appreciate your ongoing commitment to your health goals. Please review the following plan we discussed and let me know if I can assist you in the future.   These are the goals we discussed:  Goals      Patient Stated     Enjoy life        This is a list of the screening recommended for you and due dates:  Health Maintenance  Topic Date Due   COVID-19 Vaccine (10 - 2023-24 season) 05/07/2023   Flu Shot  07/04/2023   Medicare Annual Wellness Visit  06/24/2024   DTaP/Tdap/Td vaccine (3 - Td or Tdap) 10/06/2032   Pneumonia Vaccine  Completed   Zoster (Shingles) Vaccine  Completed   HPV Vaccine  Aged Out

## 2023-06-25 NOTE — Telephone Encounter (Signed)
Thanks, got him in with you in 1 week.

## 2023-07-03 ENCOUNTER — Ambulatory Visit: Payer: Medicare Other | Admitting: Student

## 2023-07-03 ENCOUNTER — Encounter: Payer: Self-pay | Admitting: Student

## 2023-07-03 VITALS — BP 132/64 | HR 76 | Temp 98.6°F | Ht 66.5 in | Wt 186.0 lb

## 2023-07-03 DIAGNOSIS — L89211 Pressure ulcer of right hip, stage 1: Secondary | ICD-10-CM | POA: Diagnosis not present

## 2023-07-03 DIAGNOSIS — H353220 Exudative age-related macular degeneration, left eye, stage unspecified: Secondary | ICD-10-CM | POA: Diagnosis not present

## 2023-07-03 DIAGNOSIS — I739 Peripheral vascular disease, unspecified: Secondary | ICD-10-CM

## 2023-07-03 DIAGNOSIS — M25612 Stiffness of left shoulder, not elsewhere classified: Secondary | ICD-10-CM | POA: Diagnosis not present

## 2023-07-03 DIAGNOSIS — L89111 Pressure ulcer of right upper back, stage 1: Secondary | ICD-10-CM

## 2023-07-03 DIAGNOSIS — F321 Major depressive disorder, single episode, moderate: Secondary | ICD-10-CM

## 2023-07-03 DIAGNOSIS — R269 Unspecified abnormalities of gait and mobility: Secondary | ICD-10-CM

## 2023-07-03 DIAGNOSIS — M6281 Muscle weakness (generalized): Secondary | ICD-10-CM

## 2023-07-03 NOTE — Progress Notes (Unsigned)
Location:  [facility]  Provider: Dr. [name]  Code Status: DNR Goals of Care:     07/03/2023    1:56 PM  Advanced Directives  Does Patient Have a Medical Advance Directive? Yes  Type of Estate agent of [place];Out of facility DNR (pink MOST or yellow form);Living will  Does patient want to make changes to medical advance directive? No - Patient declined  Copy of Healthcare Power of Attorney in Chart? Yes - validated most recent copy scanned in chart (See row information)     Chief Complaint  Patient presents with   Medical Management of Chronic Issues    6 Month follow up. Labs.      HPI: Patient is a 89 y.o. male seen today for medical management of chronic diseases.   He shuffles with walking. He periodically has pain. He had two falls the day of his wife's birhtday without any injury. He uses a walker, rollator and scooter to get around. He finished therapy back in February.   He gets help for house keeping. He has more handle bars in the bath, and has a stool in there, but doesn't   Their 67th anniversary is October 15 of this year. 10 people are going to come with them.   His wife has been encouraging him to drink plenty of water. She keeps water on his scooter.   His mood has improved with the higher dose of zoloft. Sometimes gets sad as how much she has issues with needing support from her wife.   Left shoulder - has issues reaching back behind him. He doesn't wear belts. His wife has some concerns   Sleeps on the right side has an area on her shoulder. He sleeps in the same spot and doesn't change his position while sleeping.   Keeps scooter in the garage.   He exercises 3-4 x per week  Shoulder has trouble putting belt on and touching his wife in bed.   They have no children.   He is negative for any type of surgery. He wants to take any medical options when possible because of previous DVT.   Discussed concern for  his safety at home. Concern for the impact of his level of dependence on his wife's health.   Past Medical History:  Diagnosis Date   Actinic keratosis    Anxiety    BCC (basal cell carcinoma of skin) 07/24/2021   Left upper forehead, EDC   Cancer (HCC) 2010   Prostate Cancer   Cataract    left eye   Chronic renal disease, stage III (HCC)    Chronic venous insufficiency    Clotting disorder (HCC) 2013   blood clot 3 days post knee surgery   Diverticulosis of colon    DVT (deep venous thrombosis) (HCC) 2012   after knee replacement   Glaucoma    HLD (hyperlipidemia)    Hx of basal cell carcinoma 12/23/2018   L nasal tip   Hx of basal cell carcinoma 12/23/2018   R preauricular   Hx of colonic polyp    Macular degeneration    legally blind in right eye   OA (osteoarthritis)    Personal history of prostate cancer    Pulmonary embolism (HCC) 10/2012   post op TKR   PVC (premature ventricular contraction)    Spinal stenosis of lumbar region    Squamous cell carcinoma of skin 05/06/2018   L dorsal forearm near anticubital  Past Surgical History:  Procedure Laterality Date   APPENDECTOMY     CATARACT EXTRACTION W/PHACO Left 06/24/2019   Procedure: CATARACT EXTRACTION PHACO AND INTRAOCULAR LENS PLACEMENT (IOC) LEFT;  Surgeon: Lockie Mola, MD;  Location: [facility];  Service: Ophthalmology;  Laterality: Left;   COLONOSCOPY  2011   INGUINAL HERNIA REPAIR     left   INGUINAL HERNIA REPAIR  5/12   Dr Beverly Sessions   INGUINAL HERNIA REPAIR  5/12   Dr Evette Cristal did redo of this   JOINT REPLACEMENT  11/13   Left total knee--Dr [facility]   KNEE SURGERY  2013   POLYPECTOMY  2011   PROSTATECTOMY  2010   TONSILLECTOMY     VARICOSE VEIN SURGERY     left    Allergies  Allergen Reactions   Naphazoline-Polyethyl Glycol Other (See Comments)    (Afgan) redness    Outpatient Encounter Medications as of 07/03/2023  Medication Sig   acetaminophen (TYLENOL) 500 MG  tablet Take 1,000 mg by mouth daily.   Ascorbic Acid (VITAMIN C PO) Take by mouth daily.   Cholecalciferol (VITAMIN D3 PO) Take 1,000 Units by mouth daily.   hydrocortisone 2.5 % cream Apply topically 2 (two) times daily as needed.   hydroxypropyl methylcellulose / hypromellose (ISOPTO TEARS / GONIOVISC) 2.5 % ophthalmic solution 1 drop as needed for dry eyes.   latanoprost (XALATAN) 0.005 % ophthalmic solution Place 1 drop into both eyes at bedtime.   Multiple Vitamins-Minerals (PRESERVISION AREDS 2 PO) Take 2 tablets by mouth daily.    pravastatin (PRAVACHOL) 20 MG tablet Take 1 tablet (20 mg total) by mouth daily.   sertraline (ZOLOFT) 100 MG tablet Take 1 tablet (100 mg total) by mouth daily.   No facility-administered encounter medications on file as of 07/03/2023.    Review of Systems:  Review of Systems  Health Maintenance  Topic Date Due   COVID-19 Vaccine (10 - 2023-24 season) 05/07/2023   INFLUENZA VACCINE  07/04/2023   Medicare Annual Wellness (AWV)  06/24/2024   DTaP/Tdap/Td (3 - Td or Tdap) 10/06/2032   Pneumonia Vaccine 63+ Years old  Completed   Zoster Vaccines- Shingrix  Completed   HPV VACCINES  Aged Out    Physical Exam: Vitals:   07/03/23 1353  BP: 132/64  Pulse: 76  Temp: 98.6 F (37 C)  SpO2: 97%  Weight: 186 lb (84.4 kg)  Height: 5' 6.5" (1.689 m)   Body mass index is 29.57 kg/m. Physical Exam Constitutional:      Appearance: Normal appearance.  Cardiovascular:     Rate and Rhythm: Normal rate and regular rhythm.     Pulses: Normal pulses.  Pulmonary:     Effort: Pulmonary effort is normal.  Abdominal:     General: Abdomen is flat.     Palpations: Abdomen is soft.  Musculoskeletal:     Comments: 5/5 strength biceps and triceps, deltoid 5/5 strength, 5/5 strength for empty can w/o pain. Unable to perform external rotations of the left arm without pain and limitation.   Skin:    Comments: Right shoulder and right hip with non-blancing  erythematous area. Prominent bony prominence at this time.   Neurological:     Mental Status: He is alert and oriented to person, place, and time.     Comments: Patient using a walker and slides his feet along the floor to ambulate     Labs reviewed: Basic Metabolic Panel: Recent Labs    09/24/22 0000 06/20/23 0736  NA 140 138  K 3.9 4.1  CL 103 102  CO2 27* 28  GLUCOSE  --  158*  BUN 1* 24  CREATININE 1.3 1.41*  CALCIUM 8.8 9.2   Liver Function Tests: No results for input(s): "AST", "ALT", "ALKPHOS", "BILITOT", "PROT", "ALBUMIN" in the last 8760 hours. No results for input(s): "LIPASE", "AMYLASE" in the last 8760 hours. No results for input(s): "AMMONIA" in the last 8760 hours. CBC: Recent Labs    09/24/22 0000 06/20/23 0736  WBC 8.3 7.1  NEUTROABS  --  4,551  HGB 14.3 14.0  HCT 43 42.6  MCV  --  89.9  PLT 215 182   Lipid Panel: No results for input(s): "CHOL", "HDL", "LDLCALC", "TRIG", "CHOLHDL", "LDLDIRECT" in the last 8760 hours. No results found for: "HGBA1C"  Procedures since last visit: No results found.  Assessment/Plan MDD (major depressive disorder), single episode, moderate (HCC)  Claudication (HCC)  Exudative age-related macular degeneration of left eye, unspecified stage (HCC), Chronic  Abnormal gait due to muscle weakness - Plan: Ambulatory referral to Occupational Therapy  Pressure injury of right upper back, stage 1 - Plan: Ambulatory referral to Occupational Therapy  Pressure injury of right hip, stage 1 - Plan: Ambulatory referral to Occupational Therapy  Decreased shoulder mobility, left 1. MDD (major depressive disorder), single episode, moderate (HCC) Patient states his mood is improved and stable with his increased dose of sertraline. No changes at this time.   2. Claudication (HCC) Pain continues to limit patient's mobility and exercise.   3. Exudative age-related macular degeneration of left eye, unspecified stage  (HCC) Followed by ophthalmology.   4. Abnormal gait due to muscle weakness Patient has had numerous falls despite physical therapy. Patient has had physical therapy in recent months. Discussed more help in the home, however, patient states he and his wife are doing fine. Discussed importance of optimizing both of their health and limiting strain on his wife. Will order OT to aid with supportive care as well as message social work to see if there are any additional supportive measures they can provide including memory assessment.   5. Pressure injury of right upper back, stage 1 6. Pressure injury of right hip, stage 1 Patient with limited mobility during sleep. Discussed concern for progression and importance of continued movement during the night to prevent progression. OT evaluation and treat to aid with wound prevention.   7. Decreased shoulder mobility, left Patient with limited mobility of the    Labs/tests ordered:  * No order type specified * Next appt:  Visit date not found

## 2023-07-03 NOTE — Patient Instructions (Addendum)
Please do the attached exercises 3-5x per week.   Please alternate the side you sleep on each night. Please try to have 30 grams of protein per meal. lease increase your protein intake - trying Ensure, Boost, or another type of protein shake like the one listed below.

## 2023-07-04 ENCOUNTER — Encounter: Payer: Self-pay | Admitting: Student

## 2023-07-04 DIAGNOSIS — F321 Major depressive disorder, single episode, moderate: Secondary | ICD-10-CM | POA: Insufficient documentation

## 2023-07-04 DIAGNOSIS — L89211 Pressure ulcer of right hip, stage 1: Secondary | ICD-10-CM | POA: Insufficient documentation

## 2023-07-04 DIAGNOSIS — I739 Peripheral vascular disease, unspecified: Secondary | ICD-10-CM | POA: Insufficient documentation

## 2023-07-04 DIAGNOSIS — M25612 Stiffness of left shoulder, not elsewhere classified: Secondary | ICD-10-CM | POA: Insufficient documentation

## 2023-07-04 DIAGNOSIS — L89111 Pressure ulcer of right upper back, stage 1: Secondary | ICD-10-CM | POA: Insufficient documentation

## 2023-07-12 DIAGNOSIS — M48061 Spinal stenosis, lumbar region without neurogenic claudication: Secondary | ICD-10-CM | POA: Diagnosis not present

## 2023-07-12 DIAGNOSIS — L89111 Pressure ulcer of right upper back, stage 1: Secondary | ICD-10-CM | POA: Diagnosis not present

## 2023-07-12 DIAGNOSIS — Z741 Need for assistance with personal care: Secondary | ICD-10-CM | POA: Diagnosis not present

## 2023-07-12 DIAGNOSIS — M1711 Unilateral primary osteoarthritis, right knee: Secondary | ICD-10-CM | POA: Diagnosis not present

## 2023-07-12 DIAGNOSIS — L89211 Pressure ulcer of right hip, stage 1: Secondary | ICD-10-CM | POA: Diagnosis not present

## 2023-07-12 DIAGNOSIS — R278 Other lack of coordination: Secondary | ICD-10-CM | POA: Diagnosis not present

## 2023-07-12 DIAGNOSIS — R4189 Other symptoms and signs involving cognitive functions and awareness: Secondary | ICD-10-CM | POA: Diagnosis not present

## 2023-07-12 DIAGNOSIS — M6281 Muscle weakness (generalized): Secondary | ICD-10-CM | POA: Diagnosis not present

## 2023-07-12 DIAGNOSIS — R269 Unspecified abnormalities of gait and mobility: Secondary | ICD-10-CM | POA: Diagnosis not present

## 2023-07-15 DIAGNOSIS — R278 Other lack of coordination: Secondary | ICD-10-CM | POA: Diagnosis not present

## 2023-07-15 DIAGNOSIS — L89211 Pressure ulcer of right hip, stage 1: Secondary | ICD-10-CM | POA: Diagnosis not present

## 2023-07-15 DIAGNOSIS — L89111 Pressure ulcer of right upper back, stage 1: Secondary | ICD-10-CM | POA: Diagnosis not present

## 2023-07-15 DIAGNOSIS — Z741 Need for assistance with personal care: Secondary | ICD-10-CM | POA: Diagnosis not present

## 2023-07-15 DIAGNOSIS — R4189 Other symptoms and signs involving cognitive functions and awareness: Secondary | ICD-10-CM | POA: Diagnosis not present

## 2023-07-15 DIAGNOSIS — R269 Unspecified abnormalities of gait and mobility: Secondary | ICD-10-CM | POA: Diagnosis not present

## 2023-07-17 DIAGNOSIS — R269 Unspecified abnormalities of gait and mobility: Secondary | ICD-10-CM | POA: Diagnosis not present

## 2023-07-17 DIAGNOSIS — R278 Other lack of coordination: Secondary | ICD-10-CM | POA: Diagnosis not present

## 2023-07-17 DIAGNOSIS — L89211 Pressure ulcer of right hip, stage 1: Secondary | ICD-10-CM | POA: Diagnosis not present

## 2023-07-17 DIAGNOSIS — L89111 Pressure ulcer of right upper back, stage 1: Secondary | ICD-10-CM | POA: Diagnosis not present

## 2023-07-17 DIAGNOSIS — R4189 Other symptoms and signs involving cognitive functions and awareness: Secondary | ICD-10-CM | POA: Diagnosis not present

## 2023-07-17 DIAGNOSIS — Z741 Need for assistance with personal care: Secondary | ICD-10-CM | POA: Diagnosis not present

## 2023-07-22 DIAGNOSIS — L89211 Pressure ulcer of right hip, stage 1: Secondary | ICD-10-CM | POA: Diagnosis not present

## 2023-07-22 DIAGNOSIS — L89111 Pressure ulcer of right upper back, stage 1: Secondary | ICD-10-CM | POA: Diagnosis not present

## 2023-07-22 DIAGNOSIS — R278 Other lack of coordination: Secondary | ICD-10-CM | POA: Diagnosis not present

## 2023-07-22 DIAGNOSIS — Z741 Need for assistance with personal care: Secondary | ICD-10-CM | POA: Diagnosis not present

## 2023-07-22 DIAGNOSIS — R4189 Other symptoms and signs involving cognitive functions and awareness: Secondary | ICD-10-CM | POA: Diagnosis not present

## 2023-07-22 DIAGNOSIS — R269 Unspecified abnormalities of gait and mobility: Secondary | ICD-10-CM | POA: Diagnosis not present

## 2023-07-24 DIAGNOSIS — R4189 Other symptoms and signs involving cognitive functions and awareness: Secondary | ICD-10-CM | POA: Diagnosis not present

## 2023-07-24 DIAGNOSIS — L89211 Pressure ulcer of right hip, stage 1: Secondary | ICD-10-CM | POA: Diagnosis not present

## 2023-07-24 DIAGNOSIS — R269 Unspecified abnormalities of gait and mobility: Secondary | ICD-10-CM | POA: Diagnosis not present

## 2023-07-24 DIAGNOSIS — L89111 Pressure ulcer of right upper back, stage 1: Secondary | ICD-10-CM | POA: Diagnosis not present

## 2023-07-24 DIAGNOSIS — R278 Other lack of coordination: Secondary | ICD-10-CM | POA: Diagnosis not present

## 2023-07-24 DIAGNOSIS — Z741 Need for assistance with personal care: Secondary | ICD-10-CM | POA: Diagnosis not present

## 2023-07-29 DIAGNOSIS — R278 Other lack of coordination: Secondary | ICD-10-CM | POA: Diagnosis not present

## 2023-07-29 DIAGNOSIS — Z741 Need for assistance with personal care: Secondary | ICD-10-CM | POA: Diagnosis not present

## 2023-07-29 DIAGNOSIS — R269 Unspecified abnormalities of gait and mobility: Secondary | ICD-10-CM | POA: Diagnosis not present

## 2023-07-29 DIAGNOSIS — L89111 Pressure ulcer of right upper back, stage 1: Secondary | ICD-10-CM | POA: Diagnosis not present

## 2023-07-29 DIAGNOSIS — R4189 Other symptoms and signs involving cognitive functions and awareness: Secondary | ICD-10-CM | POA: Diagnosis not present

## 2023-07-29 DIAGNOSIS — L89211 Pressure ulcer of right hip, stage 1: Secondary | ICD-10-CM | POA: Diagnosis not present

## 2023-07-31 DIAGNOSIS — Z741 Need for assistance with personal care: Secondary | ICD-10-CM | POA: Diagnosis not present

## 2023-07-31 DIAGNOSIS — L89111 Pressure ulcer of right upper back, stage 1: Secondary | ICD-10-CM | POA: Diagnosis not present

## 2023-07-31 DIAGNOSIS — R269 Unspecified abnormalities of gait and mobility: Secondary | ICD-10-CM | POA: Diagnosis not present

## 2023-07-31 DIAGNOSIS — R278 Other lack of coordination: Secondary | ICD-10-CM | POA: Diagnosis not present

## 2023-07-31 DIAGNOSIS — R4189 Other symptoms and signs involving cognitive functions and awareness: Secondary | ICD-10-CM | POA: Diagnosis not present

## 2023-07-31 DIAGNOSIS — L89211 Pressure ulcer of right hip, stage 1: Secondary | ICD-10-CM | POA: Diagnosis not present

## 2023-08-05 DIAGNOSIS — L89111 Pressure ulcer of right upper back, stage 1: Secondary | ICD-10-CM | POA: Diagnosis not present

## 2023-08-05 DIAGNOSIS — R4189 Other symptoms and signs involving cognitive functions and awareness: Secondary | ICD-10-CM | POA: Diagnosis not present

## 2023-08-05 DIAGNOSIS — R278 Other lack of coordination: Secondary | ICD-10-CM | POA: Diagnosis not present

## 2023-08-05 DIAGNOSIS — R269 Unspecified abnormalities of gait and mobility: Secondary | ICD-10-CM | POA: Diagnosis not present

## 2023-08-05 DIAGNOSIS — L89211 Pressure ulcer of right hip, stage 1: Secondary | ICD-10-CM | POA: Diagnosis not present

## 2023-08-05 DIAGNOSIS — M48061 Spinal stenosis, lumbar region without neurogenic claudication: Secondary | ICD-10-CM | POA: Diagnosis not present

## 2023-08-05 DIAGNOSIS — M1711 Unilateral primary osteoarthritis, right knee: Secondary | ICD-10-CM | POA: Diagnosis not present

## 2023-08-05 DIAGNOSIS — Z741 Need for assistance with personal care: Secondary | ICD-10-CM | POA: Diagnosis not present

## 2023-08-05 DIAGNOSIS — M6281 Muscle weakness (generalized): Secondary | ICD-10-CM | POA: Diagnosis not present

## 2023-08-07 DIAGNOSIS — Z741 Need for assistance with personal care: Secondary | ICD-10-CM | POA: Diagnosis not present

## 2023-08-07 DIAGNOSIS — L89111 Pressure ulcer of right upper back, stage 1: Secondary | ICD-10-CM | POA: Diagnosis not present

## 2023-08-07 DIAGNOSIS — R278 Other lack of coordination: Secondary | ICD-10-CM | POA: Diagnosis not present

## 2023-08-07 DIAGNOSIS — R269 Unspecified abnormalities of gait and mobility: Secondary | ICD-10-CM | POA: Diagnosis not present

## 2023-08-07 DIAGNOSIS — R4189 Other symptoms and signs involving cognitive functions and awareness: Secondary | ICD-10-CM | POA: Diagnosis not present

## 2023-08-07 DIAGNOSIS — L89211 Pressure ulcer of right hip, stage 1: Secondary | ICD-10-CM | POA: Diagnosis not present

## 2023-08-14 ENCOUNTER — Ambulatory Visit: Payer: Medicare Other | Admitting: Student

## 2023-08-15 DIAGNOSIS — Z741 Need for assistance with personal care: Secondary | ICD-10-CM | POA: Diagnosis not present

## 2023-08-15 DIAGNOSIS — R269 Unspecified abnormalities of gait and mobility: Secondary | ICD-10-CM | POA: Diagnosis not present

## 2023-08-15 DIAGNOSIS — L89211 Pressure ulcer of right hip, stage 1: Secondary | ICD-10-CM | POA: Diagnosis not present

## 2023-08-15 DIAGNOSIS — R4189 Other symptoms and signs involving cognitive functions and awareness: Secondary | ICD-10-CM | POA: Diagnosis not present

## 2023-08-15 DIAGNOSIS — L89111 Pressure ulcer of right upper back, stage 1: Secondary | ICD-10-CM | POA: Diagnosis not present

## 2023-08-15 DIAGNOSIS — R278 Other lack of coordination: Secondary | ICD-10-CM | POA: Diagnosis not present

## 2023-08-19 DIAGNOSIS — R269 Unspecified abnormalities of gait and mobility: Secondary | ICD-10-CM | POA: Diagnosis not present

## 2023-08-19 DIAGNOSIS — L89211 Pressure ulcer of right hip, stage 1: Secondary | ICD-10-CM | POA: Diagnosis not present

## 2023-08-19 DIAGNOSIS — R4189 Other symptoms and signs involving cognitive functions and awareness: Secondary | ICD-10-CM | POA: Diagnosis not present

## 2023-08-19 DIAGNOSIS — L89111 Pressure ulcer of right upper back, stage 1: Secondary | ICD-10-CM | POA: Diagnosis not present

## 2023-08-19 DIAGNOSIS — R278 Other lack of coordination: Secondary | ICD-10-CM | POA: Diagnosis not present

## 2023-08-19 DIAGNOSIS — Z741 Need for assistance with personal care: Secondary | ICD-10-CM | POA: Diagnosis not present

## 2023-08-21 ENCOUNTER — Ambulatory Visit: Payer: Medicare Other | Admitting: Student

## 2023-08-21 ENCOUNTER — Encounter: Payer: Self-pay | Admitting: Student

## 2023-08-21 VITALS — BP 126/72 | HR 67 | Temp 98.0°F | Ht 66.5 in | Wt 184.0 lb

## 2023-08-21 DIAGNOSIS — M1711 Unilateral primary osteoarthritis, right knee: Secondary | ICD-10-CM

## 2023-08-21 DIAGNOSIS — L89111 Pressure ulcer of right upper back, stage 1: Secondary | ICD-10-CM

## 2023-08-21 DIAGNOSIS — L89211 Pressure ulcer of right hip, stage 1: Secondary | ICD-10-CM | POA: Diagnosis not present

## 2023-08-21 DIAGNOSIS — Z741 Need for assistance with personal care: Secondary | ICD-10-CM | POA: Diagnosis not present

## 2023-08-21 DIAGNOSIS — R269 Unspecified abnormalities of gait and mobility: Secondary | ICD-10-CM

## 2023-08-21 DIAGNOSIS — F3342 Major depressive disorder, recurrent, in full remission: Secondary | ICD-10-CM

## 2023-08-21 DIAGNOSIS — M6281 Muscle weakness (generalized): Secondary | ICD-10-CM

## 2023-08-21 DIAGNOSIS — R278 Other lack of coordination: Secondary | ICD-10-CM | POA: Diagnosis not present

## 2023-08-21 DIAGNOSIS — R4189 Other symptoms and signs involving cognitive functions and awareness: Secondary | ICD-10-CM | POA: Diagnosis not present

## 2023-08-21 MED ORDER — SERTRALINE HCL 100 MG PO TABS
200.0000 mg | ORAL_TABLET | Freq: Every day | ORAL | 11 refills | Status: DC
Start: 2023-08-21 — End: 2023-10-02

## 2023-08-21 MED ORDER — MEPILEX EX PADS
1.0000 | MEDICATED_PAD | CUTANEOUS | 1 refills | Status: AC
Start: 2023-08-21 — End: ?

## 2023-08-21 NOTE — Progress Notes (Signed)
Location:  [facility] [place].   Provider: Dr. [name]  Code Status: DNR Goals of Care:     08/21/2023    2:30 PM  Advanced Directives  Does Patient Have a Medical Advance Directive? Yes  Type of Estate agent of [place];Out of facility DNR (pink MOST or yellow form);Living will  Does patient want to make changes to medical advance directive? No - Patient declined  Copy of Healthcare Power of Attorney in Chart? Yes - validated most recent copy scanned in chart (See row information)     Chief Complaint  Patient presents with   Medical Management of Chronic Issues    Medical Management of Chronic Issues.     HPI: Patient is a 89 y.o. male seen today for medical management of chronic diseases.    He has concerns about his wife's memory. Wondering if social work on campus could do a memory exercise with him.   He has some sadness early in the morning. Coffee in the morning helps with his mood. He doesn't want to increase the dosing - he has been on the higher dose for 2 mo with some improvement, but he remains tearful.   He changes sides in his bed every night to change position more often. Right shoulder is still pink, but nontender at this time.   Past Medical History:  Diagnosis Date   Actinic keratosis    Anxiety    BCC (basal cell carcinoma of skin) 07/24/2021   Left upper forehead, EDC   Cancer (HCC) 2010   Prostate Cancer   Cataract    left eye   Chronic renal disease, stage III (HCC)    Chronic venous insufficiency    Clotting disorder (HCC) 2013   blood clot 3 days post knee surgery   Diverticulosis of colon    DVT (deep venous thrombosis) (HCC) 2012   after knee replacement   Glaucoma    HLD (hyperlipidemia)    Hx of basal cell carcinoma 12/23/2018   L nasal tip   Hx of basal cell carcinoma 12/23/2018   R preauricular   Hx of colonic polyp    Macular degeneration    legally blind in right eye   OA (osteoarthritis)     Personal history of prostate cancer    Pulmonary embolism (HCC) 10/2012   post op TKR   PVC (premature ventricular contraction)    Spinal stenosis of lumbar region    Squamous cell carcinoma of skin 05/06/2018   L dorsal forearm near anticubital    Past Surgical History:  Procedure Laterality Date   APPENDECTOMY     CATARACT EXTRACTION W/PHACO Left 06/24/2019   Procedure: CATARACT EXTRACTION PHACO AND INTRAOCULAR LENS PLACEMENT (IOC) LEFT;  Surgeon: Lockie Mola, MD;  Location: [facility];  Service: Ophthalmology;  Laterality: Left;   COLONOSCOPY  2011   INGUINAL HERNIA REPAIR     left   INGUINAL HERNIA REPAIR  5/12   Dr Beverly Sessions   INGUINAL HERNIA REPAIR  5/12   Dr Evette Cristal did redo of this   JOINT REPLACEMENT  11/13   Left total knee--Dr [facility]   KNEE SURGERY  2013   POLYPECTOMY  2011   PROSTATECTOMY  2010   TONSILLECTOMY     VARICOSE VEIN SURGERY     left    Allergies  Allergen Reactions   Naphazoline-Polyethyl Glycol Other (See Comments)    (Afgan) redness    Outpatient Encounter Medications as of 08/21/2023  Medication Sig   acetaminophen (TYLENOL) 500 MG tablet Take 1,000 mg by mouth daily.   Ascorbic Acid (VITAMIN C PO) Take by mouth daily.   Cholecalciferol (VITAMIN D3 PO) Take 1,000 Units by mouth daily.   hydrocortisone 2.5 % cream Apply topically 2 (two) times daily as needed.   hydroxypropyl methylcellulose / hypromellose (ISOPTO TEARS / GONIOVISC) 2.5 % ophthalmic solution 1 drop as needed for dry eyes.   latanoprost (XALATAN) 0.005 % ophthalmic solution Place 1 drop into both eyes at bedtime.   Multiple Vitamins-Minerals (PRESERVISION AREDS 2 PO) Take 2 tablets by mouth daily.    pravastatin (PRAVACHOL) 20 MG tablet Take 1 tablet (20 mg total) by mouth daily.   sertraline (ZOLOFT) 100 MG tablet Take 1 tablet (100 mg total) by mouth daily.   No facility-administered encounter medications on file as of 08/21/2023.    Review of Systems:   Review of Systems  Health Maintenance  Topic Date Due   INFLUENZA VACCINE  07/04/2023   COVID-19 Vaccine (10 - 2023-24 season) 08/04/2023   Medicare Annual Wellness (AWV)  06/24/2024   DTaP/Tdap/Td (3 - Td or Tdap) 10/06/2032   Pneumonia Vaccine 63+ Years old  Completed   Zoster Vaccines- Shingrix  Completed   HPV VACCINES  Aged Out    Physical Exam: Vitals:   08/21/23 1428  BP: 126/72  Pulse: 67  Temp: 98 F (36.7 C)  SpO2: 97%  Weight: 184 lb (83.5 kg)  Height: 5' 6.5" (1.689 m)   Body mass index is 29.25 kg/m. Physical Exam Constitutional:      Appearance: Normal appearance.  Cardiovascular:     Rate and Rhythm: Normal rate and regular rhythm.     Pulses: Normal pulses.     Heart sounds: Normal heart sounds.  Pulmonary:     Effort: Pulmonary effort is normal.     Breath sounds: Normal breath sounds.  Skin:    General: Skin is warm and dry.  Neurological:     Mental Status: He is alert and oriented to person, place, and time.     Labs reviewed: Basic Metabolic Panel: Recent Labs    09/24/22 0000 06/20/23 0736  NA 140 138  K 3.9 4.1  CL 103 102  CO2 27* 28  GLUCOSE  --  158*  BUN 1* 24  CREATININE 1.3 1.41*  CALCIUM 8.8 9.2   Liver Function Tests: No results for input(s): "AST", "ALT", "ALKPHOS", "BILITOT", "PROT", "ALBUMIN" in the last 8760 hours. No results for input(s): "LIPASE", "AMYLASE" in the last 8760 hours. No results for input(s): "AMMONIA" in the last 8760 hours. CBC: Recent Labs    09/24/22 0000 06/20/23 0736  WBC 8.3 7.1  NEUTROABS  --  4,551  HGB 14.3 14.0  HCT 43 42.6  MCV  --  89.9  PLT 215 182   Lipid Panel: No results for input(s): "CHOL", "HDL", "LDLCALC", "TRIG", "CHOLHDL", "LDLDIRECT" in the last 8760 hours. No results found for: "HGBA1C"  Procedures since last visit: No results found.  Assessment/Plan Pressure injury of right upper back, stage 1 - Plan: Wound Dressings (MEPILEX) PADS  MDD (major depressive  disorder), recurrent, in full remission (HCC) - Plan: sertraline (ZOLOFT) 100 MG tablet  Abnormal gait due to muscle weakness  Primary osteoarthritis of right knee Physical therapy helping with gait as well as mobility. Knee pain improving. Patient with depression due to decline in function. Would also like a memory assessment. Message sent to social work for [facility] evaluation. Increase  dose of zoloft to 200 mg daily, f/u 6 weeks to determine improvement in mood. If no improvement, will plan to augment vs change medication.   Labs/tests ordered:  * No order type specified * Next appt:  Visit date not found

## 2023-08-21 NOTE — Patient Instructions (Addendum)
Please take 2 tablets of sertraline per day for your mood. You will receive a new prescription from [facility].   Please apply a new bandage to the right shoulder after every shower. Apply a new one to dry skin each time. Can leave the bandage to shoulder up to 3 days.

## 2023-08-22 ENCOUNTER — Telehealth: Payer: Self-pay

## 2023-08-22 NOTE — Telephone Encounter (Signed)
Patient called and states that [facility] will not ship medication Mepilex. They're saying to send an alternative. Message routed to PCP [name], MD

## 2023-08-23 NOTE — Telephone Encounter (Signed)
Message routed back to PCP [name], MD

## 2023-08-26 DIAGNOSIS — R278 Other lack of coordination: Secondary | ICD-10-CM | POA: Diagnosis not present

## 2023-08-26 DIAGNOSIS — R269 Unspecified abnormalities of gait and mobility: Secondary | ICD-10-CM | POA: Diagnosis not present

## 2023-08-26 DIAGNOSIS — Z741 Need for assistance with personal care: Secondary | ICD-10-CM | POA: Diagnosis not present

## 2023-08-26 DIAGNOSIS — R4189 Other symptoms and signs involving cognitive functions and awareness: Secondary | ICD-10-CM | POA: Diagnosis not present

## 2023-08-26 DIAGNOSIS — L89111 Pressure ulcer of right upper back, stage 1: Secondary | ICD-10-CM | POA: Diagnosis not present

## 2023-08-26 DIAGNOSIS — L89211 Pressure ulcer of right hip, stage 1: Secondary | ICD-10-CM | POA: Diagnosis not present

## 2023-08-26 NOTE — Telephone Encounter (Signed)
Message routed to PCP [name], MD more than once.

## 2023-08-28 DIAGNOSIS — L89111 Pressure ulcer of right upper back, stage 1: Secondary | ICD-10-CM | POA: Diagnosis not present

## 2023-08-28 DIAGNOSIS — R269 Unspecified abnormalities of gait and mobility: Secondary | ICD-10-CM | POA: Diagnosis not present

## 2023-08-28 DIAGNOSIS — R278 Other lack of coordination: Secondary | ICD-10-CM | POA: Diagnosis not present

## 2023-08-28 DIAGNOSIS — R4189 Other symptoms and signs involving cognitive functions and awareness: Secondary | ICD-10-CM | POA: Diagnosis not present

## 2023-08-28 DIAGNOSIS — Z741 Need for assistance with personal care: Secondary | ICD-10-CM | POA: Diagnosis not present

## 2023-08-28 DIAGNOSIS — L89211 Pressure ulcer of right hip, stage 1: Secondary | ICD-10-CM | POA: Diagnosis not present

## 2023-08-30 DIAGNOSIS — Z23 Encounter for immunization: Secondary | ICD-10-CM | POA: Diagnosis not present

## 2023-09-02 DIAGNOSIS — L89211 Pressure ulcer of right hip, stage 1: Secondary | ICD-10-CM | POA: Diagnosis not present

## 2023-09-02 DIAGNOSIS — R4189 Other symptoms and signs involving cognitive functions and awareness: Secondary | ICD-10-CM | POA: Diagnosis not present

## 2023-09-02 DIAGNOSIS — R269 Unspecified abnormalities of gait and mobility: Secondary | ICD-10-CM | POA: Diagnosis not present

## 2023-09-02 DIAGNOSIS — R278 Other lack of coordination: Secondary | ICD-10-CM | POA: Diagnosis not present

## 2023-09-02 DIAGNOSIS — Z741 Need for assistance with personal care: Secondary | ICD-10-CM | POA: Diagnosis not present

## 2023-09-02 DIAGNOSIS — L89111 Pressure ulcer of right upper back, stage 1: Secondary | ICD-10-CM | POA: Diagnosis not present

## 2023-09-04 DIAGNOSIS — R4189 Other symptoms and signs involving cognitive functions and awareness: Secondary | ICD-10-CM | POA: Diagnosis not present

## 2023-09-04 DIAGNOSIS — L89111 Pressure ulcer of right upper back, stage 1: Secondary | ICD-10-CM | POA: Diagnosis not present

## 2023-09-04 DIAGNOSIS — Z741 Need for assistance with personal care: Secondary | ICD-10-CM | POA: Diagnosis not present

## 2023-09-04 DIAGNOSIS — M1711 Unilateral primary osteoarthritis, right knee: Secondary | ICD-10-CM | POA: Diagnosis not present

## 2023-09-04 DIAGNOSIS — M6281 Muscle weakness (generalized): Secondary | ICD-10-CM | POA: Diagnosis not present

## 2023-09-04 DIAGNOSIS — L89211 Pressure ulcer of right hip, stage 1: Secondary | ICD-10-CM | POA: Diagnosis not present

## 2023-09-04 DIAGNOSIS — R269 Unspecified abnormalities of gait and mobility: Secondary | ICD-10-CM | POA: Diagnosis not present

## 2023-09-04 DIAGNOSIS — M48061 Spinal stenosis, lumbar region without neurogenic claudication: Secondary | ICD-10-CM | POA: Diagnosis not present

## 2023-09-04 DIAGNOSIS — R278 Other lack of coordination: Secondary | ICD-10-CM | POA: Diagnosis not present

## 2023-09-09 DIAGNOSIS — R269 Unspecified abnormalities of gait and mobility: Secondary | ICD-10-CM | POA: Diagnosis not present

## 2023-09-09 DIAGNOSIS — L89211 Pressure ulcer of right hip, stage 1: Secondary | ICD-10-CM | POA: Diagnosis not present

## 2023-09-09 DIAGNOSIS — R4189 Other symptoms and signs involving cognitive functions and awareness: Secondary | ICD-10-CM | POA: Diagnosis not present

## 2023-09-09 DIAGNOSIS — Z741 Need for assistance with personal care: Secondary | ICD-10-CM | POA: Diagnosis not present

## 2023-09-09 DIAGNOSIS — R278 Other lack of coordination: Secondary | ICD-10-CM | POA: Diagnosis not present

## 2023-09-09 DIAGNOSIS — L89111 Pressure ulcer of right upper back, stage 1: Secondary | ICD-10-CM | POA: Diagnosis not present

## 2023-09-11 DIAGNOSIS — Z741 Need for assistance with personal care: Secondary | ICD-10-CM | POA: Diagnosis not present

## 2023-09-11 DIAGNOSIS — R4189 Other symptoms and signs involving cognitive functions and awareness: Secondary | ICD-10-CM | POA: Diagnosis not present

## 2023-09-11 DIAGNOSIS — R269 Unspecified abnormalities of gait and mobility: Secondary | ICD-10-CM | POA: Diagnosis not present

## 2023-09-11 DIAGNOSIS — L89111 Pressure ulcer of right upper back, stage 1: Secondary | ICD-10-CM | POA: Diagnosis not present

## 2023-09-11 DIAGNOSIS — R278 Other lack of coordination: Secondary | ICD-10-CM | POA: Diagnosis not present

## 2023-09-11 DIAGNOSIS — L89211 Pressure ulcer of right hip, stage 1: Secondary | ICD-10-CM | POA: Diagnosis not present

## 2023-09-16 DIAGNOSIS — L89211 Pressure ulcer of right hip, stage 1: Secondary | ICD-10-CM | POA: Diagnosis not present

## 2023-09-16 DIAGNOSIS — H353221 Exudative age-related macular degeneration, left eye, with active choroidal neovascularization: Secondary | ICD-10-CM | POA: Diagnosis not present

## 2023-09-16 DIAGNOSIS — R4189 Other symptoms and signs involving cognitive functions and awareness: Secondary | ICD-10-CM | POA: Diagnosis not present

## 2023-09-16 DIAGNOSIS — R278 Other lack of coordination: Secondary | ICD-10-CM | POA: Diagnosis not present

## 2023-09-16 DIAGNOSIS — R269 Unspecified abnormalities of gait and mobility: Secondary | ICD-10-CM | POA: Diagnosis not present

## 2023-09-16 DIAGNOSIS — Z741 Need for assistance with personal care: Secondary | ICD-10-CM | POA: Diagnosis not present

## 2023-09-16 DIAGNOSIS — L89111 Pressure ulcer of right upper back, stage 1: Secondary | ICD-10-CM | POA: Diagnosis not present

## 2023-09-19 DIAGNOSIS — L219 Seborrheic dermatitis, unspecified: Secondary | ICD-10-CM | POA: Diagnosis not present

## 2023-09-26 DIAGNOSIS — Z23 Encounter for immunization: Secondary | ICD-10-CM | POA: Diagnosis not present

## 2023-10-02 ENCOUNTER — Ambulatory Visit: Payer: Medicare Other | Admitting: Student

## 2023-10-02 ENCOUNTER — Encounter: Payer: Self-pay | Admitting: Student

## 2023-10-02 VITALS — BP 128/76 | HR 74 | Temp 98.2°F | Ht 66.5 in | Wt 181.0 lb

## 2023-10-02 DIAGNOSIS — H353220 Exudative age-related macular degeneration, left eye, stage unspecified: Secondary | ICD-10-CM

## 2023-10-02 DIAGNOSIS — G3184 Mild cognitive impairment, so stated: Secondary | ICD-10-CM

## 2023-10-02 DIAGNOSIS — F321 Major depressive disorder, single episode, moderate: Secondary | ICD-10-CM

## 2023-10-02 DIAGNOSIS — M629 Disorder of muscle, unspecified: Secondary | ICD-10-CM

## 2023-10-02 DIAGNOSIS — N1831 Chronic kidney disease, stage 3a: Secondary | ICD-10-CM

## 2023-10-02 DIAGNOSIS — M1711 Unilateral primary osteoarthritis, right knee: Secondary | ICD-10-CM

## 2023-10-02 DIAGNOSIS — F3342 Major depressive disorder, recurrent, in full remission: Secondary | ICD-10-CM

## 2023-10-02 DIAGNOSIS — E785 Hyperlipidemia, unspecified: Secondary | ICD-10-CM | POA: Diagnosis not present

## 2023-10-02 MED ORDER — SERTRALINE HCL 100 MG PO TABS
100.0000 mg | ORAL_TABLET | Freq: Every day | ORAL | 11 refills | Status: DC
Start: 2023-10-02 — End: 2024-10-15

## 2023-10-02 NOTE — Patient Instructions (Signed)
Please return to clinic in 3 months for labs and a follow up appointment.   Please come Monday or Thursday before your appointment for blood collection.

## 2023-10-02 NOTE — Progress Notes (Unsigned)
Location:  [facility].   Provider: Dr. [name]  Code Status: DNR Goals of Care:     10/02/2023    1:29 PM  Advanced Directives  Does Patient Have a Medical Advance Directive? Yes  Type of Estate agent of [place];Out of facility DNR (pink MOST or yellow form);Living will  Does patient want to make changes to medical advance directive? No - Patient declined  Copy of Healthcare Power of Attorney in Chart? Yes - validated most recent copy scanned in chart (See row information)     Chief Complaint  Patient presents with   Medical Management of Chronic Issues    Medical Management of Chronic Issues. 6 Week Follow up    HPI: Patient is a 89 y.o. male seen today for medical management of chronic diseases.    No new concerns. He has been taking 1 zoloft. He feels like things are better. He is spoiled all the way around.   His right should er is better. He is rolling more often. No bad sleep. He "got his feelings hurt with his memory" because his wife did better.   He has more concern for carol. She does a lot for him at home. Eye drops. Helps with medications. "She spoils me."  He was having some trouble with his stomach on Tuesdays because he wasn't eating food before taking his medication.   Denies nausea/vomiting/bowel movements.   OT just stopped last week. She would make recommendations and he wouldn't do all of it every time.   Weight down 5 lb since July. 3 meals aday and no heavey foods.   Macular degeneration - shots are expensive every month.   Past Medical History:  Diagnosis Date   Actinic keratosis    Anxiety    BCC (basal cell carcinoma of skin) 07/24/2021   Left upper forehead, EDC   Cancer (HCC) 2010   Prostate Cancer   Cataract    left eye   Chronic renal disease, stage III (HCC)    Chronic venous insufficiency    Clotting disorder (HCC) 2013   blood clot 3 days post knee surgery   Diverticulosis of colon     DVT (deep venous thrombosis) (HCC) 2012   after knee replacement   Glaucoma    HLD (hyperlipidemia)    Hx of basal cell carcinoma 12/23/2018   L nasal tip   Hx of basal cell carcinoma 12/23/2018   R preauricular   Hx of colonic polyp    Macular degeneration    legally blind in right eye   OA (osteoarthritis)    Personal history of prostate cancer    Pulmonary embolism (HCC) 10/2012   post op TKR   PVC (premature ventricular contraction)    Spinal stenosis of lumbar region    Squamous cell carcinoma of skin 05/06/2018   L dorsal forearm near anticubital    Past Surgical History:  Procedure Laterality Date   APPENDECTOMY     CATARACT EXTRACTION W/PHACO Left 06/24/2019   Procedure: CATARACT EXTRACTION PHACO AND INTRAOCULAR LENS PLACEMENT (IOC) LEFT;  Surgeon: Lockie Mola, MD;  Location: [facility];  Service: Ophthalmology;  Laterality: Left;   COLONOSCOPY  2011   INGUINAL HERNIA REPAIR     left   INGUINAL HERNIA REPAIR  5/12   Dr Beverly Sessions   INGUINAL HERNIA REPAIR  5/12   Dr Evette Cristal did redo of this   JOINT REPLACEMENT  11/13   Left total knee--Dr Hooten  KNEE SURGERY  2013   POLYPECTOMY  2011   PROSTATECTOMY  2010   TONSILLECTOMY     VARICOSE VEIN SURGERY     left    Allergies  Allergen Reactions   Naphazoline-Polyethyl Glycol Other (See Comments)    (Afgan) redness    Outpatient Encounter Medications as of 10/02/2023  Medication Sig   acetaminophen (TYLENOL) 500 MG tablet Take 1,000 mg by mouth daily.   Ascorbic Acid (VITAMIN C PO) Take by mouth daily.   Cholecalciferol (VITAMIN D3 PO) Take 1,000 Units by mouth daily.   hydrocortisone 2.5 % cream Apply topically 2 (two) times daily as needed.   hydroxypropyl methylcellulose / hypromellose (ISOPTO TEARS / GONIOVISC) 2.5 % ophthalmic solution 1 drop as needed for dry eyes.   latanoprost (XALATAN) 0.005 % ophthalmic solution Place 1 drop into both eyes at bedtime.   Multiple Vitamins-Minerals  (PRESERVISION AREDS 2 PO) Take 2 tablets by mouth daily.    pravastatin (PRAVACHOL) 20 MG tablet Take 1 tablet (20 mg total) by mouth daily.   sertraline (ZOLOFT) 100 MG tablet Take 2 tablets (200 mg total) by mouth daily. (Patient taking differently: Take 100 mg by mouth daily.)   Wound Dressings (MEPILEX) PADS Apply 1 Application topically every 3 (three) days.   No facility-administered encounter medications on file as of 10/02/2023.    Review of Systems:  Review of Systems  Health Maintenance  Topic Date Due   COVID-19 Vaccine (11 - 2023-24 season) 10/25/2023   Medicare Annual Wellness (AWV)  06/24/2024   DTaP/Tdap/Td (3 - Td or Tdap) 10/06/2032   Pneumonia Vaccine 58+ Years old  Completed   INFLUENZA VACCINE  Completed   Zoster Vaccines- Shingrix  Completed   HPV VACCINES  Aged Out    Physical Exam: Vitals:   10/02/23 1331  BP: 128/76  Pulse: 74  Temp: 98.2 F (36.8 C)  SpO2: 94%  Weight: 181 lb (82.1 kg)  Height: 5' 6.5" (1.689 m)   Body mass index is 28.78 kg/m. Physical Exam  Labs reviewed: Basic Metabolic Panel: Recent Labs    06/20/23 0736  NA 138  K 4.1  CL 102  CO2 28  GLUCOSE 158*  BUN 24  CREATININE 1.41*  CALCIUM 9.2   Liver Function Tests: No results for input(s): "AST", "ALT", "ALKPHOS", "BILITOT", "PROT", "ALBUMIN" in the last 8760 hours. No results for input(s): "LIPASE", "AMYLASE" in the last 8760 hours. No results for input(s): "AMMONIA" in the last 8760 hours. CBC: Recent Labs    06/20/23 0736  WBC 7.1  NEUTROABS 4,551  HGB 14.0  HCT 42.6  MCV 89.9  PLT 182   Lipid Panel: No results for input(s): "CHOL", "HDL", "LDLCALC", "TRIG", "CHOLHDL", "LDLDIRECT" in the last 8760 hours. No results found for: "HGBA1C"  Procedures since last visit: No results found.  Assessment/Plan There are no diagnoses linked to this encounter.   Labs/tests ordered:  * No order type specified * Next appt:  Visit date not found

## 2023-10-03 ENCOUNTER — Encounter: Payer: Self-pay | Admitting: Student

## 2023-10-03 DIAGNOSIS — G3184 Mild cognitive impairment, so stated: Secondary | ICD-10-CM | POA: Insufficient documentation

## 2023-10-21 ENCOUNTER — Other Ambulatory Visit: Payer: Self-pay | Admitting: Student

## 2023-10-21 NOTE — Telephone Encounter (Signed)
Patient is requesting 90 tablets and 3 refills. Medication pend and sent to PCP [name], MD

## 2023-11-28 ENCOUNTER — Other Ambulatory Visit: Payer: Self-pay | Admitting: Internal Medicine

## 2023-11-28 DIAGNOSIS — Z5181 Encounter for therapeutic drug level monitoring: Secondary | ICD-10-CM

## 2023-11-28 DIAGNOSIS — Z7901 Long term (current) use of anticoagulants: Secondary | ICD-10-CM

## 2023-11-28 DIAGNOSIS — I2699 Other pulmonary embolism without acute cor pulmonale: Secondary | ICD-10-CM

## 2023-12-12 DIAGNOSIS — H353221 Exudative age-related macular degeneration, left eye, with active choroidal neovascularization: Secondary | ICD-10-CM | POA: Diagnosis not present

## 2023-12-12 DIAGNOSIS — Z961 Presence of intraocular lens: Secondary | ICD-10-CM | POA: Diagnosis not present

## 2023-12-12 DIAGNOSIS — H401131 Primary open-angle glaucoma, bilateral, mild stage: Secondary | ICD-10-CM | POA: Diagnosis not present

## 2023-12-18 DIAGNOSIS — I7091 Generalized atherosclerosis: Secondary | ICD-10-CM | POA: Diagnosis not present

## 2023-12-18 DIAGNOSIS — B351 Tinea unguium: Secondary | ICD-10-CM | POA: Diagnosis not present

## 2023-12-30 DIAGNOSIS — H353221 Exudative age-related macular degeneration, left eye, with active choroidal neovascularization: Secondary | ICD-10-CM | POA: Diagnosis not present

## 2023-12-30 DIAGNOSIS — Z961 Presence of intraocular lens: Secondary | ICD-10-CM | POA: Diagnosis not present

## 2023-12-30 DIAGNOSIS — N1831 Chronic kidney disease, stage 3a: Secondary | ICD-10-CM | POA: Diagnosis not present

## 2023-12-30 DIAGNOSIS — M629 Disorder of muscle, unspecified: Secondary | ICD-10-CM | POA: Diagnosis not present

## 2023-12-30 DIAGNOSIS — H353123 Nonexudative age-related macular degeneration, left eye, advanced atrophic without subfoveal involvement: Secondary | ICD-10-CM | POA: Diagnosis not present

## 2023-12-30 DIAGNOSIS — E785 Hyperlipidemia, unspecified: Secondary | ICD-10-CM | POA: Diagnosis not present

## 2023-12-31 LAB — CBC WITH DIFFERENTIAL/PLATELET
Absolute Lymphocytes: 1979 {cells}/uL (ref 850–3900)
Absolute Monocytes: 585 {cells}/uL (ref 200–950)
Basophils Absolute: 39 {cells}/uL (ref 0–200)
Basophils Relative: 0.5 %
Eosinophils Absolute: 146 {cells}/uL (ref 15–500)
Eosinophils Relative: 1.9 %
HCT: 44.4 % (ref 38.5–50.0)
Hemoglobin: 14 g/dL (ref 13.2–17.1)
MCH: 27.2 pg (ref 27.0–33.0)
MCHC: 31.5 g/dL — ABNORMAL LOW (ref 32.0–36.0)
MCV: 86.2 fL (ref 80.0–100.0)
MPV: 11.2 fL (ref 7.5–12.5)
Monocytes Relative: 7.6 %
Neutro Abs: 4951 {cells}/uL (ref 1500–7800)
Neutrophils Relative %: 64.3 %
Platelets: 196 10*3/uL (ref 140–400)
RBC: 5.15 10*6/uL (ref 4.20–5.80)
RDW: 15.7 % — ABNORMAL HIGH (ref 11.0–15.0)
Total Lymphocyte: 25.7 %
WBC: 7.7 10*3/uL (ref 3.8–10.8)

## 2023-12-31 LAB — COMPLETE METABOLIC PANEL WITH GFR
AG Ratio: 1.3 (calc) (ref 1.0–2.5)
ALT: 23 U/L (ref 9–46)
AST: 17 U/L (ref 10–35)
Albumin: 4.2 g/dL (ref 3.6–5.1)
Alkaline phosphatase (APISO): 63 U/L (ref 35–144)
BUN/Creatinine Ratio: 24 (calc) — ABNORMAL HIGH (ref 6–22)
BUN: 33 mg/dL — ABNORMAL HIGH (ref 7–25)
CO2: 31 mmol/L (ref 20–32)
Calcium: 9.4 mg/dL (ref 8.6–10.3)
Chloride: 102 mmol/L (ref 98–110)
Creat: 1.36 mg/dL — ABNORMAL HIGH (ref 0.70–1.22)
Globulin: 3.3 g/dL (ref 1.9–3.7)
Glucose, Bld: 85 mg/dL (ref 65–99)
Potassium: 4.2 mmol/L (ref 3.5–5.3)
Sodium: 139 mmol/L (ref 135–146)
Total Bilirubin: 0.7 mg/dL (ref 0.2–1.2)
Total Protein: 7.5 g/dL (ref 6.1–8.1)
eGFR: 50 mL/min/{1.73_m2} — ABNORMAL LOW (ref >=60)

## 2023-12-31 LAB — LIPID PANEL
Cholesterol: 187 mg/dL (ref <200)
HDL: 52 mg/dL (ref >=40)
LDL Cholesterol (Calc): 112 mg/dL — ABNORMAL HIGH
Non-HDL Cholesterol (Calc): 135 mg/dL — ABNORMAL HIGH (ref <130)
Total CHOL/HDL Ratio: 3.6 (calc) (ref <5.0)
Triglycerides: 122 mg/dL (ref <150)

## 2023-12-31 LAB — PTH, INTACT AND CALCIUM
Calcium: 9.4 mg/dL (ref 8.6–10.3)
PTH: 30 pg/mL (ref 16–77)

## 2023-12-31 LAB — VITAMIN D 25 HYDROXY (VIT D DEFICIENCY, FRACTURES): Vit D, 25-Hydroxy: 46 ng/mL (ref 30–100)

## 2023-12-31 LAB — TSH: TSH: 2.77 m[IU]/L (ref 0.40–4.50)

## 2024-01-01 ENCOUNTER — Encounter: Payer: Self-pay | Admitting: Student

## 2024-01-02 DIAGNOSIS — H169 Unspecified keratitis: Secondary | ICD-10-CM | POA: Diagnosis not present

## 2024-01-02 DIAGNOSIS — H16001 Unspecified corneal ulcer, right eye: Secondary | ICD-10-CM | POA: Diagnosis not present

## 2024-01-03 ENCOUNTER — Encounter: Payer: Self-pay | Admitting: Student

## 2024-01-03 ENCOUNTER — Ambulatory Visit (INDEPENDENT_AMBULATORY_CARE_PROVIDER_SITE_OTHER): Payer: Medicare Other | Admitting: Student

## 2024-01-03 VITALS — BP 124/66 | HR 69 | Temp 97.9°F | Ht 66.5 in | Wt 178.0 lb

## 2024-01-03 DIAGNOSIS — N1831 Chronic kidney disease, stage 3a: Secondary | ICD-10-CM | POA: Diagnosis not present

## 2024-01-03 DIAGNOSIS — G6289 Other specified polyneuropathies: Secondary | ICD-10-CM | POA: Diagnosis not present

## 2024-01-03 DIAGNOSIS — H353220 Exudative age-related macular degeneration, left eye, stage unspecified: Secondary | ICD-10-CM

## 2024-01-03 DIAGNOSIS — F321 Major depressive disorder, single episode, moderate: Secondary | ICD-10-CM

## 2024-01-03 DIAGNOSIS — E785 Hyperlipidemia, unspecified: Secondary | ICD-10-CM

## 2024-01-03 NOTE — Patient Instructions (Signed)
VISIT SUMMARY:  During today's visit, we discussed your recent vision changes, numbness in your feet, and other health concerns. We reviewed your current medications and dietary habits, and we talked about some steps you can take to manage your symptoms and improve your overall health.  YOUR PLAN:  -Macular Degeneration: You are experiencing severe sensitivity to bright lights, which is affecting your ability to drive and perform daily activities. This condition is known as photophobia. We discussed the safety of vitamin B12 supplementation, although its impact on macular degeneration is uncertain. We also talked about the potential benefits of joining a support group for memory changes. Please follow up with Dr. Neale Burly on Monday and consider taking vitamin B12 supplements. Additionally, contact Charisse about joining a support group.  -PERIPHERAL NEUROPATHY: You have numbness in your feet and difficulty distinguishing textures, which is likely due to age-related peripheral neuropathy. This condition affects the nerves and can cause a lack of sensation. We will check your vitamin B12 levels to rule out a deficiency and monitor your feet for any calluses or cuts.  -CHRONIC KIDNEY DISEASE STAGE 3: Your blood tests show elevated levels of blood urea nitrogen (BUN), which suggests possible dehydration and indicates stage 3 chronic kidney disease. This condition affects your kidney function. To prevent further kidney damage, increase your fluid intake and avoid using NSAIDs (non-steroidal anti-inflammatory drugs).  -HYPERLIPIDEMIA: Your LDL cholesterol level is 112, and you are currently taking pravastatin, which is appropriate for managing your cholesterol. Your triglycerides are within the normal range, indicating good dietary habits. Continue taking pravastatin as prescribed.  -GENERAL HEALTH MAINTENANCE: Your recent lab results are mostly within normal ranges, including white blood cell count,  hemoglobin, platelets, glucose, liver function, vitamin D, thyroid, parathyroid, and calcium levels. Maintain your hydration and protein intake, and monitor your lab results regularly.  INSTRUCTIONS:  Please follow up with your eye doctor on Monday for your vision disturbance. Additionally, contact Charisse about joining a support group for memory changes.

## 2024-01-03 NOTE — Progress Notes (Signed)
Location:  [facility] clinic [facility].   Provider: Dr. [name]  Code Status: DNR Goals of Care:     01/03/2024    1:58 PM  Advanced Directives  Does Patient Have a Medical Advance Directive? Yes  Type of Estate agent of [place];Out of facility DNR (pink MOST or yellow form);Living will  Does patient want to make changes to medical advance directive? No - Patient declined  Copy of Healthcare Power of Attorney in Chart? Yes - validated most recent copy scanned in chart (See row information)     Chief Complaint  Patient presents with   Medical Management of Chronic Issues    Medical Management of Chronic Issues. 3 Month follow up with Labs. Complains of Bight Light with Vision has had 2 episodes.    Quality Metric Gaps    To discuss need for Covid.     HPI: Patient is a 89 y.o. male seen today for medical management of chronic diseases.   Discussed the use of AI scribe software for clinical note transcription with the patient, who gave verbal consent to proceed.  History of Present Illness   The patient presents for routine visit with vision changes. He was referred by ophthalmologist for evaluation of vision changes.  He experiences significant issues with bright lights affecting his vision, occurring twice in the past week. One incident while driving was manageable, but another left him disoriented. He has been diagnosed with an infection and is undergoing treatment, with a follow-up appointment scheduled. He is concerned about the impact of bright lights on daily activities and inquires about the potential benefits of vitamin B12 for his vision.  He reports numbness in his feet, present for an unspecified duration, with a lack of sensation and difficulty distinguishing between soft and scratchy sensations. No burning or sharp pains are noted, but there are issues with balance and standing. He has a history of chronic lower back pain and occasional urinary  incontinence, attributed to difficulty reaching the bathroom in time.  His current medication regimen includes pravastatin for cholesterol management. He has changed his protein supplement due to cost, now using a product with 40 grams of protein per serving. He reports good dietary habits, consuming water with every meal, and has recently adjusted his fluid intake to improve hydration.       Past Medical History:  Diagnosis Date   Actinic keratosis    Anxiety    BCC (basal cell carcinoma of skin) 07/24/2021   Left upper forehead, ED[facility]   Cancer (HCC) 2010   Prostate Cancer   Cataract    left eye   Chronic renal disease, stage III (HCC)    Chronic venous insufficiency    Clotting disorder (HCC) 2013   blood clot 3 days post knee surgery   Diverticulosis of colon    DVT (deep venous thrombosis) (HCC) 2012   after knee replacement   Glaucoma    HLD (hyperlipidemia)    Hx of basal cell carcinoma 12/23/2018   L nasal tip   Hx of basal cell carcinoma 12/23/2018   R preauricular   Hx of colonic polyp    Macular degeneration    legally blind in right eye   OA (osteoarthritis)    Personal history of prostate cancer    Pulmonary embolism (HCC) 10/2012   post op TKR   PVC (premature ventricular contraction)    Spinal stenosis of lumbar region    Squamous cell carcinoma of skin 05/06/2018  L dorsal forearm near anticubital    Past Surgical History:  Procedure Laterality Date   APPENDECTOMY     CATARACT EXTRACTION W/PHACO Left 06/24/2019   Procedure: CATARACT EXTRACTION PHACO AND INTRAOCULAR LENS PLACEMENT (IOC) LEFT;  Surgeon: Lockie Mola, MD;  Location: [facility];  Service: Ophthalmology;  Laterality: Left;   COLONOSCOPY  2011   INGUINAL HERNIA REPAIR     left   INGUINAL HERNIA REPAIR  5/12   Dr Beverly Sessions   INGUINAL HERNIA REPAIR  5/12   Dr Evette Cristal did redo of this   JOINT REPLACEMENT  11/13   Left total knee--[facility]   KNEE SURGERY  2013    POLYPECTOMY  2011   PROSTATECTOMY  2010   TONSILLECTOMY     VARICOSE VEIN SURGERY     left    Allergies  Allergen Reactions   Naphazoline-Polyethyl Glycol Other (See Comments)    (Afgan) redness    Outpatient Encounter Medications as of 01/03/2024  Medication Sig   acetaminophen (TYLENOL) 500 MG tablet Take 1,000 mg by mouth daily.   Ascorbic Acid (VITAMIN C PO) Take by mouth daily.   Cholecalciferol (VITAMIN D3 PO) Take 1,000 Units by mouth daily.   erythromycin ophthalmic ointment Place 1 Application into both eyes at bedtime.   hydrocortisone 2.5 % cream Apply topically 2 (two) times daily as needed.   hydroxypropyl methylcellulose / hypromellose (ISOPTO TEARS / GONIOVISC) 2.5 % ophthalmic solution 1 drop as needed for dry eyes.   latanoprost (XALATAN) 0.005 % ophthalmic solution Place 1 drop into both eyes at bedtime.   Multiple Vitamins-Minerals (PRESERVISION AREDS 2 PO) Take 2 tablets by mouth daily.    pravastatin (PRAVACHOL) 20 MG tablet TAKE 1 TABLET DAILY   sertraline (ZOLOFT) 100 MG tablet Take 1 tablet (100 mg total) by mouth daily.   Wound Dressings (MEPILEX) PADS Apply 1 Application topically every 3 (three) days.   No facility-administered encounter medications on file as of 01/03/2024.    Review of Systems:  Review of Systems  Health Maintenance  Topic Date Due   COVID-19 Vaccine (11 - 2024-25 season) 10/25/2023   Medicare Annual Wellness (AWV)  06/24/2024   DTaP/Tdap/Td (3 - Td or Tdap) 10/06/2032   Pneumonia Vaccine 58+ Years old  Completed   INFLUENZA VACCINE  Completed   Zoster Vaccines- Shingrix  Completed   HPV VACCINES  Aged Out    Physical Exam: Vitals:   01/03/24 1355  BP: 124/66  Pulse: 69  Temp: 97.9 F (36.6 C)  SpO2: 98%  Weight: 178 lb (80.7 kg)  Height: 5' 6.5" (1.689 m)   Body mass index is 28.3 kg/m. Physical Exam Physical Exam   CARDIOVASCULAR: Pulses palpable in feet. NEUROLOGICAL: Unable to differentiate between soft and  scratchy sensations on feet, described sensation as scratchy. Normal sensation to soft touch on hand. Feet cold to touch.      Labs reviewed: Basic Metabolic Panel: Recent Labs    06/20/23 0736 12/30/23 0758  NA 138 139  K 4.1 4.2  CL 102 102  CO2 28 31  GLUCOSE 158* 85  BUN 24 33*  CREATININE 1.41* 1.36*  CALCIUM 9.2 9.4  9.4  TSH  --  2.77   Liver Function Tests: Recent Labs    12/30/23 0758  AST 17  ALT 23  BILITOT 0.7  PROT 7.5   No results for input(s): "LIPASE", "AMYLASE" in the last 8760 hours. No results for input(s): "AMMONIA" in the last 8760 hours. CBC:  Recent Labs    06/20/23 0736 12/30/23 0758  WBC 7.1 7.7  NEUTROABS 4,551 4,951  HGB 14.0 14.0  HCT 42.6 44.4  MCV 89.9 86.2  PLT 182 196   Lipid Panel: Recent Labs    12/30/23 0758  CHOL 187  HDL 52  LDLCALC 112*  TRIG 122  CHOLHDL 3.6   No results found for: "HGBA1C"  Procedures since last visit: No results found. Results   LABS WBC: Normal (01/02/2024) Hb: Normal (01/02/2024) PLT: 196 (01/02/2024) Glucose: <100 (01/02/2024) BUN: 33 (01/02/2024) BUN/Cr ratio: Elevated (01/02/2024) Albumin: Normal (01/02/2024) Vitamin D: 46 (01/02/2024) TSH: 2.77 (01/02/2024) PTH: 30 (01/02/2024) Calcium: 9.4 (01/02/2024) LDL: 112 (01/02/2024) Triglycerides: Normal (01/02/2024)       Assessment/Plan Assessment and Plan    Dry Macular Degeneration Severe photophobia impacting driving. Recently treated for an eye infection by Dr. Neale Burly. Discussed vitamin B12 supplementation; noted its safety but uncertain impact on macular degeneration. Potential benefits of support groups for memory changes discussed. - Follow up with Dr. Neale Burly on Monday - Consider vitamin B12 supplementation - Contact [place] about support group  Peripheral Neuropathy Numbness in feet with difficulty distinguishing textures. Suspected age-related peripheral neuropathy. Plan to check vitamin B12 levels to rule out  deficiency. - Check vitamin B12 levels - Monitor for calluses or cuts on feet  Chronic Kidney Disease Stage 3 BUN 33 with elevated BUN/creatinine ratio, suggesting possible dehydration. Advised to avoid NSAIDs to prevent further kidney damage. - Increase fluid intake - Avoid NSAIDs  Hyperlipidemia LDL cholesterol at 112. Currently on pravastatin, which is appropriate. Triglycerides within normal range, indicating good dietary habits. - Continue pravastatin  General Health Maintenance Recent labs mostly within normal ranges, including WBC, hemoglobin, platelets, glucose, liver function, vitamin D, thyroid, parathyroid, and calcium levels. Advised to maintain hydration and protein intake. - Maintain hydration - Continue protein intake - Monitor lab results regularly  Follow-up - Follow up with ophthalmology on Monday. follow up in 3 months.     Next appt:  06/30/2024

## 2024-01-06 DIAGNOSIS — H16213 Exposure keratoconjunctivitis, bilateral: Secondary | ICD-10-CM | POA: Diagnosis not present

## 2024-01-06 DIAGNOSIS — H169 Unspecified keratitis: Secondary | ICD-10-CM | POA: Diagnosis not present

## 2024-01-27 DIAGNOSIS — H353123 Nonexudative age-related macular degeneration, left eye, advanced atrophic without subfoveal involvement: Secondary | ICD-10-CM | POA: Diagnosis not present

## 2024-02-04 ENCOUNTER — Telehealth: Payer: Self-pay

## 2024-02-04 DIAGNOSIS — W19XXXS Unspecified fall, sequela: Secondary | ICD-10-CM

## 2024-02-04 NOTE — Telephone Encounter (Signed)
 Patient wife stated that the patient is having pain the right arm where he fall after getting out of the bed trying to the bathroom on 01/26/24 and wanted to asked [name], MD can she placed an order to physical therapy in the home because they are not able to go the physical therapy over at [facility].  Please Advise   Message sent to [name], MD

## 2024-02-05 NOTE — Telephone Encounter (Signed)
Order placed for home PT.

## 2024-02-05 NOTE — Addendum Note (Signed)
 Addended by: [name] on: 02/05/2024 08:02 PM   Modules accepted: Orders

## 2024-02-06 NOTE — Telephone Encounter (Signed)
 Spoke with patient wife and she has agreed and had no questions or no concerns at this time.

## 2024-02-11 DIAGNOSIS — Z741 Need for assistance with personal care: Secondary | ICD-10-CM | POA: Diagnosis not present

## 2024-02-11 DIAGNOSIS — Z9181 History of falling: Secondary | ICD-10-CM | POA: Diagnosis not present

## 2024-02-11 DIAGNOSIS — R296 Repeated falls: Secondary | ICD-10-CM | POA: Diagnosis not present

## 2024-02-11 DIAGNOSIS — H3530 Unspecified macular degeneration: Secondary | ICD-10-CM | POA: Diagnosis not present

## 2024-02-11 DIAGNOSIS — R4189 Other symptoms and signs involving cognitive functions and awareness: Secondary | ICD-10-CM | POA: Diagnosis not present

## 2024-02-11 DIAGNOSIS — M6281 Muscle weakness (generalized): Secondary | ICD-10-CM | POA: Diagnosis not present

## 2024-02-11 DIAGNOSIS — M1711 Unilateral primary osteoarthritis, right knee: Secondary | ICD-10-CM | POA: Diagnosis not present

## 2024-02-11 DIAGNOSIS — R2689 Other abnormalities of gait and mobility: Secondary | ICD-10-CM | POA: Diagnosis not present

## 2024-02-11 DIAGNOSIS — M48061 Spinal stenosis, lumbar region without neurogenic claudication: Secondary | ICD-10-CM | POA: Diagnosis not present

## 2024-02-11 DIAGNOSIS — R278 Other lack of coordination: Secondary | ICD-10-CM | POA: Diagnosis not present

## 2024-02-12 DIAGNOSIS — Z741 Need for assistance with personal care: Secondary | ICD-10-CM | POA: Diagnosis not present

## 2024-02-12 DIAGNOSIS — R278 Other lack of coordination: Secondary | ICD-10-CM | POA: Diagnosis not present

## 2024-02-12 DIAGNOSIS — H3530 Unspecified macular degeneration: Secondary | ICD-10-CM | POA: Diagnosis not present

## 2024-02-12 DIAGNOSIS — R296 Repeated falls: Secondary | ICD-10-CM | POA: Diagnosis not present

## 2024-02-12 DIAGNOSIS — R4189 Other symptoms and signs involving cognitive functions and awareness: Secondary | ICD-10-CM | POA: Diagnosis not present

## 2024-02-12 DIAGNOSIS — Z9181 History of falling: Secondary | ICD-10-CM | POA: Diagnosis not present

## 2024-02-13 DIAGNOSIS — Z741 Need for assistance with personal care: Secondary | ICD-10-CM | POA: Diagnosis not present

## 2024-02-13 DIAGNOSIS — R4189 Other symptoms and signs involving cognitive functions and awareness: Secondary | ICD-10-CM | POA: Diagnosis not present

## 2024-02-13 DIAGNOSIS — Z9181 History of falling: Secondary | ICD-10-CM | POA: Diagnosis not present

## 2024-02-13 DIAGNOSIS — R278 Other lack of coordination: Secondary | ICD-10-CM | POA: Diagnosis not present

## 2024-02-13 DIAGNOSIS — H3530 Unspecified macular degeneration: Secondary | ICD-10-CM | POA: Diagnosis not present

## 2024-02-13 DIAGNOSIS — R296 Repeated falls: Secondary | ICD-10-CM | POA: Diagnosis not present

## 2024-02-17 DIAGNOSIS — R4189 Other symptoms and signs involving cognitive functions and awareness: Secondary | ICD-10-CM | POA: Diagnosis not present

## 2024-02-17 DIAGNOSIS — R278 Other lack of coordination: Secondary | ICD-10-CM | POA: Diagnosis not present

## 2024-02-17 DIAGNOSIS — Z741 Need for assistance with personal care: Secondary | ICD-10-CM | POA: Diagnosis not present

## 2024-02-17 DIAGNOSIS — R296 Repeated falls: Secondary | ICD-10-CM | POA: Diagnosis not present

## 2024-02-17 DIAGNOSIS — Z9181 History of falling: Secondary | ICD-10-CM | POA: Diagnosis not present

## 2024-02-17 DIAGNOSIS — H3530 Unspecified macular degeneration: Secondary | ICD-10-CM | POA: Diagnosis not present

## 2024-02-18 DIAGNOSIS — R4189 Other symptoms and signs involving cognitive functions and awareness: Secondary | ICD-10-CM | POA: Diagnosis not present

## 2024-02-18 DIAGNOSIS — Z9181 History of falling: Secondary | ICD-10-CM | POA: Diagnosis not present

## 2024-02-18 DIAGNOSIS — R278 Other lack of coordination: Secondary | ICD-10-CM | POA: Diagnosis not present

## 2024-02-18 DIAGNOSIS — R296 Repeated falls: Secondary | ICD-10-CM | POA: Diagnosis not present

## 2024-02-18 DIAGNOSIS — Z741 Need for assistance with personal care: Secondary | ICD-10-CM | POA: Diagnosis not present

## 2024-02-18 DIAGNOSIS — H3530 Unspecified macular degeneration: Secondary | ICD-10-CM | POA: Diagnosis not present

## 2024-02-19 DIAGNOSIS — Z741 Need for assistance with personal care: Secondary | ICD-10-CM | POA: Diagnosis not present

## 2024-02-19 DIAGNOSIS — Z9181 History of falling: Secondary | ICD-10-CM | POA: Diagnosis not present

## 2024-02-19 DIAGNOSIS — R4189 Other symptoms and signs involving cognitive functions and awareness: Secondary | ICD-10-CM | POA: Diagnosis not present

## 2024-02-19 DIAGNOSIS — R296 Repeated falls: Secondary | ICD-10-CM | POA: Diagnosis not present

## 2024-02-19 DIAGNOSIS — R278 Other lack of coordination: Secondary | ICD-10-CM | POA: Diagnosis not present

## 2024-02-19 DIAGNOSIS — H3530 Unspecified macular degeneration: Secondary | ICD-10-CM | POA: Diagnosis not present

## 2024-02-20 DIAGNOSIS — R4189 Other symptoms and signs involving cognitive functions and awareness: Secondary | ICD-10-CM | POA: Diagnosis not present

## 2024-02-20 DIAGNOSIS — R278 Other lack of coordination: Secondary | ICD-10-CM | POA: Diagnosis not present

## 2024-02-20 DIAGNOSIS — Z9181 History of falling: Secondary | ICD-10-CM | POA: Diagnosis not present

## 2024-02-20 DIAGNOSIS — R296 Repeated falls: Secondary | ICD-10-CM | POA: Diagnosis not present

## 2024-02-20 DIAGNOSIS — Z741 Need for assistance with personal care: Secondary | ICD-10-CM | POA: Diagnosis not present

## 2024-02-20 DIAGNOSIS — H3530 Unspecified macular degeneration: Secondary | ICD-10-CM | POA: Diagnosis not present

## 2024-02-24 DIAGNOSIS — B351 Tinea unguium: Secondary | ICD-10-CM | POA: Diagnosis not present

## 2024-02-24 DIAGNOSIS — H3530 Unspecified macular degeneration: Secondary | ICD-10-CM | POA: Diagnosis not present

## 2024-02-24 DIAGNOSIS — R4189 Other symptoms and signs involving cognitive functions and awareness: Secondary | ICD-10-CM | POA: Diagnosis not present

## 2024-02-24 DIAGNOSIS — R296 Repeated falls: Secondary | ICD-10-CM | POA: Diagnosis not present

## 2024-02-24 DIAGNOSIS — Z9181 History of falling: Secondary | ICD-10-CM | POA: Diagnosis not present

## 2024-02-24 DIAGNOSIS — Z741 Need for assistance with personal care: Secondary | ICD-10-CM | POA: Diagnosis not present

## 2024-02-24 DIAGNOSIS — R278 Other lack of coordination: Secondary | ICD-10-CM | POA: Diagnosis not present

## 2024-02-24 DIAGNOSIS — I7091 Generalized atherosclerosis: Secondary | ICD-10-CM | POA: Diagnosis not present

## 2024-02-26 DIAGNOSIS — Z741 Need for assistance with personal care: Secondary | ICD-10-CM | POA: Diagnosis not present

## 2024-02-26 DIAGNOSIS — R4189 Other symptoms and signs involving cognitive functions and awareness: Secondary | ICD-10-CM | POA: Diagnosis not present

## 2024-02-26 DIAGNOSIS — R278 Other lack of coordination: Secondary | ICD-10-CM | POA: Diagnosis not present

## 2024-02-26 DIAGNOSIS — Z9181 History of falling: Secondary | ICD-10-CM | POA: Diagnosis not present

## 2024-02-26 DIAGNOSIS — H3530 Unspecified macular degeneration: Secondary | ICD-10-CM | POA: Diagnosis not present

## 2024-02-26 DIAGNOSIS — R296 Repeated falls: Secondary | ICD-10-CM | POA: Diagnosis not present

## 2024-03-02 DIAGNOSIS — H3530 Unspecified macular degeneration: Secondary | ICD-10-CM | POA: Diagnosis not present

## 2024-03-02 DIAGNOSIS — R296 Repeated falls: Secondary | ICD-10-CM | POA: Diagnosis not present

## 2024-03-02 DIAGNOSIS — R4189 Other symptoms and signs involving cognitive functions and awareness: Secondary | ICD-10-CM | POA: Diagnosis not present

## 2024-03-02 DIAGNOSIS — R278 Other lack of coordination: Secondary | ICD-10-CM | POA: Diagnosis not present

## 2024-03-02 DIAGNOSIS — Z9181 History of falling: Secondary | ICD-10-CM | POA: Diagnosis not present

## 2024-03-02 DIAGNOSIS — Z741 Need for assistance with personal care: Secondary | ICD-10-CM | POA: Diagnosis not present

## 2024-03-02 DIAGNOSIS — H353123 Nonexudative age-related macular degeneration, left eye, advanced atrophic without subfoveal involvement: Secondary | ICD-10-CM | POA: Diagnosis not present

## 2024-03-03 DIAGNOSIS — M1711 Unilateral primary osteoarthritis, right knee: Secondary | ICD-10-CM | POA: Diagnosis not present

## 2024-03-03 DIAGNOSIS — R2689 Other abnormalities of gait and mobility: Secondary | ICD-10-CM | POA: Diagnosis not present

## 2024-03-03 DIAGNOSIS — R278 Other lack of coordination: Secondary | ICD-10-CM | POA: Diagnosis not present

## 2024-03-03 DIAGNOSIS — H3530 Unspecified macular degeneration: Secondary | ICD-10-CM | POA: Diagnosis not present

## 2024-03-03 DIAGNOSIS — Z9181 History of falling: Secondary | ICD-10-CM | POA: Diagnosis not present

## 2024-03-03 DIAGNOSIS — M48061 Spinal stenosis, lumbar region without neurogenic claudication: Secondary | ICD-10-CM | POA: Diagnosis not present

## 2024-03-03 DIAGNOSIS — M6281 Muscle weakness (generalized): Secondary | ICD-10-CM | POA: Diagnosis not present

## 2024-03-03 DIAGNOSIS — R296 Repeated falls: Secondary | ICD-10-CM | POA: Diagnosis not present

## 2024-03-03 DIAGNOSIS — R4189 Other symptoms and signs involving cognitive functions and awareness: Secondary | ICD-10-CM | POA: Diagnosis not present

## 2024-03-03 DIAGNOSIS — Z741 Need for assistance with personal care: Secondary | ICD-10-CM | POA: Diagnosis not present

## 2024-03-04 DIAGNOSIS — Z9181 History of falling: Secondary | ICD-10-CM | POA: Diagnosis not present

## 2024-03-04 DIAGNOSIS — R278 Other lack of coordination: Secondary | ICD-10-CM | POA: Diagnosis not present

## 2024-03-04 DIAGNOSIS — R296 Repeated falls: Secondary | ICD-10-CM | POA: Diagnosis not present

## 2024-03-04 DIAGNOSIS — H3530 Unspecified macular degeneration: Secondary | ICD-10-CM | POA: Diagnosis not present

## 2024-03-04 DIAGNOSIS — Z741 Need for assistance with personal care: Secondary | ICD-10-CM | POA: Diagnosis not present

## 2024-03-04 DIAGNOSIS — R4189 Other symptoms and signs involving cognitive functions and awareness: Secondary | ICD-10-CM | POA: Diagnosis not present

## 2024-03-05 DIAGNOSIS — R278 Other lack of coordination: Secondary | ICD-10-CM | POA: Diagnosis not present

## 2024-03-05 DIAGNOSIS — H3530 Unspecified macular degeneration: Secondary | ICD-10-CM | POA: Diagnosis not present

## 2024-03-05 DIAGNOSIS — R296 Repeated falls: Secondary | ICD-10-CM | POA: Diagnosis not present

## 2024-03-05 DIAGNOSIS — Z741 Need for assistance with personal care: Secondary | ICD-10-CM | POA: Diagnosis not present

## 2024-03-05 DIAGNOSIS — R4189 Other symptoms and signs involving cognitive functions and awareness: Secondary | ICD-10-CM | POA: Diagnosis not present

## 2024-03-05 DIAGNOSIS — Z9181 History of falling: Secondary | ICD-10-CM | POA: Diagnosis not present

## 2024-03-09 DIAGNOSIS — R4189 Other symptoms and signs involving cognitive functions and awareness: Secondary | ICD-10-CM | POA: Diagnosis not present

## 2024-03-09 DIAGNOSIS — H3530 Unspecified macular degeneration: Secondary | ICD-10-CM | POA: Diagnosis not present

## 2024-03-09 DIAGNOSIS — R296 Repeated falls: Secondary | ICD-10-CM | POA: Diagnosis not present

## 2024-03-09 DIAGNOSIS — R278 Other lack of coordination: Secondary | ICD-10-CM | POA: Diagnosis not present

## 2024-03-09 DIAGNOSIS — Z741 Need for assistance with personal care: Secondary | ICD-10-CM | POA: Diagnosis not present

## 2024-03-09 DIAGNOSIS — L814 Other melanin hyperpigmentation: Secondary | ICD-10-CM | POA: Diagnosis not present

## 2024-03-09 DIAGNOSIS — L820 Inflamed seborrheic keratosis: Secondary | ICD-10-CM | POA: Diagnosis not present

## 2024-03-09 DIAGNOSIS — Z9181 History of falling: Secondary | ICD-10-CM | POA: Diagnosis not present

## 2024-03-09 DIAGNOSIS — L821 Other seborrheic keratosis: Secondary | ICD-10-CM | POA: Diagnosis not present

## 2024-03-10 DIAGNOSIS — R296 Repeated falls: Secondary | ICD-10-CM | POA: Diagnosis not present

## 2024-03-10 DIAGNOSIS — Z9181 History of falling: Secondary | ICD-10-CM | POA: Diagnosis not present

## 2024-03-10 DIAGNOSIS — R4189 Other symptoms and signs involving cognitive functions and awareness: Secondary | ICD-10-CM | POA: Diagnosis not present

## 2024-03-10 DIAGNOSIS — R278 Other lack of coordination: Secondary | ICD-10-CM | POA: Diagnosis not present

## 2024-03-10 DIAGNOSIS — H3530 Unspecified macular degeneration: Secondary | ICD-10-CM | POA: Diagnosis not present

## 2024-03-10 DIAGNOSIS — Z741 Need for assistance with personal care: Secondary | ICD-10-CM | POA: Diagnosis not present

## 2024-03-11 ENCOUNTER — Ambulatory Visit: Payer: Self-pay

## 2024-03-11 DIAGNOSIS — Z9181 History of falling: Secondary | ICD-10-CM | POA: Diagnosis not present

## 2024-03-11 DIAGNOSIS — R4189 Other symptoms and signs involving cognitive functions and awareness: Secondary | ICD-10-CM | POA: Diagnosis not present

## 2024-03-11 DIAGNOSIS — R296 Repeated falls: Secondary | ICD-10-CM | POA: Diagnosis not present

## 2024-03-11 DIAGNOSIS — H3530 Unspecified macular degeneration: Secondary | ICD-10-CM | POA: Diagnosis not present

## 2024-03-11 DIAGNOSIS — R278 Other lack of coordination: Secondary | ICD-10-CM | POA: Diagnosis not present

## 2024-03-11 DIAGNOSIS — Z741 Need for assistance with personal care: Secondary | ICD-10-CM | POA: Diagnosis not present

## 2024-03-11 NOTE — Telephone Encounter (Signed)
 Copied from CRM [id number]. Topic: Clinical - Red Word Triage >> Mar 11, 2024 10:49 AM Juan Horn wrote: Red Word that prompted transfer to Nurse Triage: feels like legs are going to give out. Has leg weakness but today its bad.   Chief Complaint: On going weakness to legs, uses walker, afraid of falling Symptoms: Above Frequency: Awhile per wife Pertinent Negatives: Patient denies  Disposition: [] ED /[] Urgent Care (no appt availability in office) / [x] Appointment(In office/virtual)/ []  [facility]/ [] Home Care/ [] Refused Recommended Disposition /[] [place] [facility]/ []  Follow-up with PCP Additional Notes: Agrees with appointment.  Reason for Disposition  [1] MILD weakness (i.e., does not interfere with ability to work, go to school, normal activities) AND [2] persists > 1 week  Answer Assessment - Initial Assessment Questions 1. DESCRIPTION: "Describe how you are feeling."     Legs areweak 2. SEVERITY: "How bad is it?"  "Can you stand and walk?"   - MILD (0-3): Feels weak or tired, but does not interfere with work, school or normal activities.   - MODERATE (4-7): Able to stand and walk; weakness interferes with work, school, or normal activities.   - SEVERE (8-10): Unable to stand or walk; unable to do usual activities.     Moderate 3. ONSET: "When did these symptoms begin?" (e.g., hours, days, weeks, months)     Awhile 4. CAUSE: "What do you think is causing the weakness or fatigue?" (e.g., not drinking enough fluids, medical problem, trouble sleeping)     Weak legs 5. NEW MEDICINES:  "Have you started on any new medicines recently?" (e.g., opioid pain medicines, benzodiazepines, muscle relaxants, antidepressants, antihistamines, neuroleptics, beta blockers)     No 6. OTHER SYMPTOMS: "Do you have any other symptoms?" (e.g., chest pain, fever, cough, SOB, vomiting, diarrhea, bleeding, other areas of pain)     No 7. PREGNANCY: "Is there any chance you are pregnant?" "When  was your last menstrual period?"     N/a  Protocols used: Weakness (Generalized) and Fatigue-A-AH

## 2024-03-12 DIAGNOSIS — Z9181 History of falling: Secondary | ICD-10-CM | POA: Diagnosis not present

## 2024-03-12 DIAGNOSIS — Z741 Need for assistance with personal care: Secondary | ICD-10-CM | POA: Diagnosis not present

## 2024-03-12 DIAGNOSIS — R4189 Other symptoms and signs involving cognitive functions and awareness: Secondary | ICD-10-CM | POA: Diagnosis not present

## 2024-03-12 DIAGNOSIS — R296 Repeated falls: Secondary | ICD-10-CM | POA: Diagnosis not present

## 2024-03-12 DIAGNOSIS — H3530 Unspecified macular degeneration: Secondary | ICD-10-CM | POA: Diagnosis not present

## 2024-03-12 DIAGNOSIS — R278 Other lack of coordination: Secondary | ICD-10-CM | POA: Diagnosis not present

## 2024-03-13 ENCOUNTER — Ambulatory Visit: Admitting: Student

## 2024-03-13 ENCOUNTER — Encounter: Payer: Self-pay | Admitting: Student

## 2024-03-13 VITALS — BP 122/66 | HR 73 | Temp 97.7°F | Ht 66.5 in | Wt 178.0 lb

## 2024-03-13 DIAGNOSIS — N1831 Chronic kidney disease, stage 3a: Secondary | ICD-10-CM | POA: Diagnosis not present

## 2024-03-13 DIAGNOSIS — M791 Myalgia, unspecified site: Secondary | ICD-10-CM

## 2024-03-13 DIAGNOSIS — Z23 Encounter for immunization: Secondary | ICD-10-CM | POA: Diagnosis not present

## 2024-03-13 NOTE — Patient Instructions (Addendum)
 VISIT SUMMARY:  You visited [place] today due to left leg pain that starts in the hip and radiates downwards, primarily when sitting. This pain began a few weeks ago, following a fall three months ago. We discussed your current medications and exercise habits, and reviewed your general health maintenance.  YOUR PLAN:  -LEFT LEG PAIN: Your left leg pain is suspected to be due to piriformis syndrome, which is when a tight muscle in the hip presses on a nerve, causing pain that radiates down the leg. We recommend you perform stretches targeting the lower extremities and continue with physical therapy to help manage the pain and improve balance.  -STATIN-ASSOCIATED MUSCLE SYMPTOMS: Statin-associated muscle symptoms can occur due to high levels of statin therapy, which may cause muscle pain and cramping. To see if your symptoms improve, we recommend discontinuing your statin therapy for now. We will monitor your lab results to evaluate the impact of this change.  -GENERAL HEALTH MAINTENANCE: You have been receiving regular lab checks, with the last one done in January. We will schedule a home visit for lab collection on Monday at 7:30 AM, which will include phosphorus and urine tests.  INSTRUCTIONS:  Please perform the recommended stretches for your lower extremities and continue with physical therapy. Discontinue your statin therapy and monitor for any changes in your symptoms. We will schedule a home visit for lab collection on Monday at 7:30 AM.  Sciatica Rehab Ask your health care provider which exercises are safe for you. Do exercises exactly as told by your health care provider and adjust them as directed. It is normal to feel mild stretching, pulling, tightness, or discomfort as you do these exercises. Stop right away if you feel sudden pain or your pain gets worse. Do not begin these exercises until told by your health care provider. Stretching and range-of-motion exercises These exercises warm up  your muscles and joints and improve the movement and flexibility of your hips and back. These exercises also help to relieve pain, numbness, and tingling. Sciatic nerve glide  Sit in a chair with your head facing down toward your chest. Place your hands behind your back. Let your shoulders slump forward. Slowly straighten one of your legs while you tilt your head back as if you are looking toward the ceiling. Only straighten your leg as far as you can without making your symptoms worse. Hold this position for __________ seconds. Slowly return your leg and head back to the starting position. Repeat with your other leg. Repeat __________ times. Complete this exercise __________ times a day. Knee to chest with hip adduction and internal rotation  Lie on your back on a firm surface with both legs straight. Bend one of your knees and move it up toward your chest until you feel a gentle stretch in your lower back and buttock. Then, move your knee toward the shoulder that is on the opposite side from your leg. This is hip adduction and internal rotation. Hold your leg in this position by holding on to the front of your knee. Hold this position for __________ seconds. Slowly return to the starting position. Repeat with your other leg. Repeat __________ times. Complete this exercise __________ times a day. Prone extension on elbows  Lie on your abdomen on a firm surface. A bed may be too soft for this exercise. Prop yourself up on your elbows. Use your arms to help lift your chest up until you feel a gentle stretch in your abdomen and your lower  back. This will place some of your body weight on your elbows. If this is uncomfortable, try stacking pillows under your chest. Your hips should stay down, against the surface that you are lying on. Keep your hip and back muscles relaxed. Hold this position for __________ seconds. Slowly relax your upper body and return to the starting position. Repeat  __________ times. Complete this exercise __________ times a day. Strengthening exercises These exercises build strength and endurance in your back. Endurance is the ability to use your muscles for a long time, even after they get tired. Pelvic tilt This exercise strengthens the muscles that lie deep in the abdomen. Lie on your back on a firm surface. Bend your knees and keep your feet flat on the surface. Tense your abdominal muscles. Tip your pelvis up toward the ceiling and flatten your lower back into the firm surface. To help with this exercise, you may place a small towel under your lower back and try to push your back into the towel. Hold this position for __________ seconds. Let your muscles relax completely before you repeat this exercise. Repeat __________ times. Complete this exercise __________ times a day. Alternating arm and leg raises  Get on your hands and knees on a firm surface. If you are on a hard floor, you may want to use padding, such as an exercise mat, to cushion your knees. Line up your arms and legs. Your hands should be directly below your shoulders, and your knees should be directly below your hips. Lift your left leg behind you. At the same time, raise your right arm and straighten it in front of you. Do not lift your leg higher than your hip. Do not lift your arm higher than your shoulder. Keep your abdominal and back muscles tight. Keep your hips facing the ground. Do not arch your back. Keep your balance carefully, and do not hold your breath. Hold this position for __________ seconds. Slowly return to the starting position. Repeat with your right leg and your left arm. Repeat __________ times. Complete this exercise __________ times a day. Posture and body mechanics Good posture and healthy body mechanics can help to relieve stress in your body's tissues and joints. Body mechanics refers to the movements and positions of your body while you do your daily  activities. Posture is part of body mechanics. Good posture means: Your spine is in its natural S-curve position (neutral). Your shoulders are pulled back slightly. Your head is not tipped forward. Follow these guidelines to improve your posture and body mechanics in your everyday activities. Standing  When standing, keep your spine neutral and your feet about hip width apart. Keep a slight bend in your knees. Your ears, shoulders, and hips should line up. When you do a task in which you stand in one place for a long time, place one foot up on a stable object that is 2-4 inches (5-10 cm) high, such as a footstool. This helps keep your spine neutral. Sitting  When sitting, keep your spine neutral and keep your feet flat on the floor. Use a footrest, if necessary, and keep your thighs parallel to the floor. Avoid rounding your shoulders, and avoid tilting your head forward. When working at a desk or a computer, keep your desk at a height where your hands are slightly lower than your elbows. Slide your chair under your desk so you are close enough to maintain good posture. When working at a computer, place your monitor at a  height where you are looking straight ahead and you do not have to tilt your head forward or downward to look at the screen. Resting  When lying down and resting, avoid positions that are most painful for you. If you have pain with activities such as sitting, bending, stooping, or squatting, lie in a position in which your body does not bend very much. For example, avoid curling up on your side with your arms and knees near your chest (fetal position). If you have pain with activities such as standing for a long time or reaching with your arms, lie with your spine in a neutral position and bend your knees slightly. Try the following positions: Lying on your side with a pillow between your knees. Lying on your back with a pillow under your knees. Lifting  When lifting objects,  keep your feet at least shoulder width apart and tighten your abdominal muscles. Bend your knees and hips and keep your spine neutral. It is important to lift using the strength of your legs, not your back. Do not lock your knees straight out. Always ask for help to lift heavy or awkward objects. This information is not intended to replace advice given to you by your health care provider. Make sure you discuss any questions you have with your health care provider. Document Revised: 02/27/2022 Document Reviewed: 02/27/2022 Elsevier Patient Education  2024 [employer].

## 2024-03-14 ENCOUNTER — Encounter: Payer: Self-pay | Admitting: Student

## 2024-03-14 NOTE — Progress Notes (Signed)
 [facility]  Provider: Jann Melody  Code Status: DNR Goals of Care:     03/13/2024    4:09 PM  Advanced Directives  Does Patient Have a Medical Advance Directive? Yes  Type of Estate agent of [place];Living will;Out of facility DNR (pink MOST or yellow form)  Does patient want to make changes to medical advance directive? No - Patient declined  Copy of Healthcare Power of Attorney in Chart? Yes - validated most recent copy scanned in chart (See row information)     Chief Complaint  Patient presents with   Extremity Weakness    Weakness in Left Leg with pain for the last 2 weeks only when he is sitting.     HPI: Patient is a 89 y.o. male seen today for an acute visit.  Discussed the use of AI scribe software for clinical note transcription with the patient, who gave verbal consent to proceed.  History of Present Illness   Juan Horn is an 89 year old male who presents with left leg pain.   He has been experiencing left leg pain that starts in the hip and radiates downwards, primarily occurring when sitting. The pain began three to four weeks ago, following a fall that happened about three months ago. After the fall, he underwent physical therapy for balance issues. He describes the pain as a 'nerve problem' that originates in the hip and moves down the leg.  He has been on statin therapy for approximately ten to fifteen years, currently on a half dose of the statin. He has not experienced cramps from these medications in the past.  He used to go to the gym at the college but has not been engaging in much exercise or stretching recently.  No pain in the back except in the mornings, which he attributes to stylocynosis. No significant weight loss, although he notes a slight difference in his weight.        Past Medical History:  Diagnosis Date   Actinic keratosis    Anxiety    BCC (basal cell carcinoma of skin)  07/24/2021   Left upper forehead, EDC   Cancer (HCC) 2010   Prostate Cancer   Cataract    left eye   Chronic renal disease, stage III (HCC)    Chronic venous insufficiency    Clotting disorder (HCC) 2013   blood clot 3 days post knee surgery   Diverticulosis of colon    DVT (deep venous thrombosis) (HCC) 2012   after knee replacement   Glaucoma    HLD (hyperlipidemia)    Hx of basal cell carcinoma 12/23/2018   L nasal tip   Hx of basal cell carcinoma 12/23/2018   R preauricular   Hx of colonic polyp    Macular degeneration    legally blind in right eye   OA (osteoarthritis)    Personal history of prostate cancer    Pulmonary embolism (HCC) 10/2012   post op TKR   PVC (premature ventricular contraction)    Spinal stenosis of lumbar region    Squamous cell carcinoma of skin 05/06/2018   L dorsal forearm near anticubital    Past Surgical History:  Procedure Laterality Date   APPENDECTOMY     CATARACT EXTRACTION W/PHACO Left 06/24/2019   Procedure: CATARACT EXTRACTION PHACO AND INTRAOCULAR LENS PLACEMENT (IOC) LEFT;  Surgeon: Annell Kidney, MD;  Location: [facility];  Service: Ophthalmology;  Laterality: Left;   COLONOSCOPY  2011   INGUINAL HERNIA REPAIR     left   INGUINAL HERNIA REPAIR  5/12   Dr Hosey Macadam   INGUINAL HERNIA REPAIR  5/12   Dr Lorel Roes did redo of this   JOINT REPLACEMENT  11/13   Left total knee--Dr [facility]   KNEE SURGERY  2013   POLYPECTOMY  2011   PROSTATECTOMY  2010   TONSILLECTOMY     VARICOSE VEIN SURGERY     left    Allergies  Allergen Reactions   Naphazoline-Polyethyl Glycol Other (See Comments)    (Afgan) redness    Outpatient Encounter Medications as of 03/13/2024  Medication Sig   acetaminophen (TYLENOL) 500 MG tablet Take 1,000 mg by mouth daily.   Ascorbic Acid (VITAMIN C PO) Take by mouth daily.   Cholecalciferol (VITAMIN D3 PO) Take 1,000 Units by mouth daily.   erythromycin ophthalmic ointment Place 1  Application into both eyes at bedtime.   hydrocortisone 2.5 % cream Apply topically 2 (two) times daily as needed.   hydroxypropyl methylcellulose / hypromellose (ISOPTO TEARS / GONIOVISC) 2.5 % ophthalmic solution 1 drop as needed for dry eyes.   latanoprost (XALATAN) 0.005 % ophthalmic solution Place 1 drop into both eyes at bedtime.   Multiple Vitamins-Minerals (PRESERVISION AREDS 2 PO) Take 2 tablets by mouth daily.    sertraline (ZOLOFT) 100 MG tablet Take 1 tablet (100 mg total) by mouth daily.   Wound Dressings (MEPILEX) PADS Apply 1 Application topically every 3 (three) days.   [DISCONTINUED] pravastatin (PRAVACHOL) 20 MG tablet TAKE 1 TABLET DAILY   No facility-administered encounter medications on file as of 03/13/2024.    Review of Systems:  Review of Systems  Health Maintenance  Topic Date Due   COVID-19 Vaccine (11 - Moderna risk 2024-25 season) 02/27/2024   Medicare Annual Wellness (AWV)  06/24/2024   INFLUENZA VACCINE  07/03/2024   DTaP/Tdap/Td (3 - Td or Tdap) 10/06/2032   Pneumonia Vaccine 59+ Years old  Completed   Zoster Vaccines- Shingrix  Completed   HPV VACCINES  Aged Out   Meningococcal B Vaccine  Aged Out    Physical Exam: Vitals:   03/13/24 1606  BP: 122/66  Pulse: 73  Temp: 97.7 F (36.5 C)  SpO2: 100%  Weight: 178 lb (80.7 kg)  Height: 5' 6.5" (1.689 m)   Body mass index is 28.3 kg/m. Physical Exam Physical Exam   MUSCULOSKELETAL: Left hip muscles tight, non-tender. Left hip range of motion limited, no pain.       Labs reviewed: Basic Metabolic Panel: Recent Labs    06/20/23 0736 12/30/23 0758  NA 138 139  K 4.1 4.2  CL 102 102  CO2 28 31  GLUCOSE 158* 85  BUN 24 33*  CREATININE 1.41* 1.36*  CALCIUM 9.2 9.4  9.4  TSH  --  2.77   Liver Function Tests: Recent Labs    12/30/23 0758  AST 17  ALT 23  BILITOT 0.7  PROT 7.5   No results for input(s): "LIPASE", "AMYLASE" in the last 8760 hours. No results for input(s):  "AMMONIA" in the last 8760 hours. CBC: Recent Labs    06/20/23 0736 12/30/23 0758  WBC 7.1 7.7  NEUTROABS 4,551 4,951  HGB 14.0 14.0  HCT 42.6 44.4  MCV 89.9 86.2  PLT 182 196   Lipid Panel: Recent Labs    12/30/23 0758  CHOL 187  HDL 52  LDLCALC 112*  TRIG 122  CHOLHDL 3.6   No results found  for: "HGBA1C"  Procedures since last visit: No results found. Results           Assessment/Plan     Left gluteal pain He reports left leg pain, primarily when sitting, originating in the hip and radiating down the leg. The pain began a few weeks ago, possibly related to a fall three months ago. Limited lower extremity exam based on patient mobility. Suspected piriformis syndrome due to muscle tightness pressing on a nerve, rather than neuropathy, as the pain does not start from the toes. Lack of regular stretching exercises may contribute to the issue. - Provide instructions for stretches targeting the lower extremities. - Encourage continuation of physical therapy for balance and leg pain management.  Statin-associated muscle symptoms He has been on statin therapy for over 15 years and reports leg pain. Consideration of statin-associated muscle symptoms due to high levels of statin therapy causing cramping and leg pain. Currently on a half dose of statin. Discontinuing the statin is proposed to assess if symptoms improve. - Discontinue statin therapy to assess if symptoms improve. - Monitor laboratory results to evaluate the impact of discontinuing statin therapy.  General Health Maintenance He is 89 years old and has been receiving regular lab checks. The last lab check was in January, approximately three months ago. He is an early riser and prefers morning appointments. - Schedule a home visit for lab collection on Monday at 7:30 AM. - Include phosphorus and urine tests in the lab workup.       Labs/tests ordered:  - COMPLETE METABOLIC PANEL WITHOUT GFR - Phosphorus -  PTH, intact and calcium - VITAMIN D 25 Hydroxy (Vit-D Deficiency, Fractures) - Microalbumin / creatinine urine ratio - CBC With Differential/Platelet Next appt:  04/08/2024  I spent greater than 30 minutes for the care of this patient in face to face time, chart review, clinical documentation, patient education.

## 2024-03-16 DIAGNOSIS — Z741 Need for assistance with personal care: Secondary | ICD-10-CM | POA: Diagnosis not present

## 2024-03-16 DIAGNOSIS — Z9181 History of falling: Secondary | ICD-10-CM | POA: Diagnosis not present

## 2024-03-16 DIAGNOSIS — H3530 Unspecified macular degeneration: Secondary | ICD-10-CM | POA: Diagnosis not present

## 2024-03-16 DIAGNOSIS — M791 Myalgia, unspecified site: Secondary | ICD-10-CM | POA: Diagnosis not present

## 2024-03-16 DIAGNOSIS — R278 Other lack of coordination: Secondary | ICD-10-CM | POA: Diagnosis not present

## 2024-03-16 DIAGNOSIS — N1831 Chronic kidney disease, stage 3a: Secondary | ICD-10-CM | POA: Diagnosis not present

## 2024-03-16 DIAGNOSIS — R296 Repeated falls: Secondary | ICD-10-CM | POA: Diagnosis not present

## 2024-03-16 DIAGNOSIS — R4189 Other symptoms and signs involving cognitive functions and awareness: Secondary | ICD-10-CM | POA: Diagnosis not present

## 2024-03-17 DIAGNOSIS — H3530 Unspecified macular degeneration: Secondary | ICD-10-CM | POA: Diagnosis not present

## 2024-03-17 DIAGNOSIS — R296 Repeated falls: Secondary | ICD-10-CM | POA: Diagnosis not present

## 2024-03-17 DIAGNOSIS — R4189 Other symptoms and signs involving cognitive functions and awareness: Secondary | ICD-10-CM | POA: Diagnosis not present

## 2024-03-17 DIAGNOSIS — Z741 Need for assistance with personal care: Secondary | ICD-10-CM | POA: Diagnosis not present

## 2024-03-17 DIAGNOSIS — Z9181 History of falling: Secondary | ICD-10-CM | POA: Diagnosis not present

## 2024-03-17 DIAGNOSIS — R278 Other lack of coordination: Secondary | ICD-10-CM | POA: Diagnosis not present

## 2024-03-17 LAB — PHOSPHORUS: Phosphorus: 3.7 mg/dL (ref 2.1–4.3)

## 2024-03-17 LAB — COMPLETE METABOLIC PANEL WITHOUT GFR
AG Ratio: 1.2 (calc) (ref 1.0–2.5)
ALT: 25 U/L (ref 9–46)
AST: 17 U/L (ref 10–35)
Albumin: 4.1 g/dL (ref 3.6–5.1)
Alkaline phosphatase (APISO): 75 U/L (ref 35–144)
BUN/Creatinine Ratio: 27 (calc) — ABNORMAL HIGH (ref 6–22)
BUN: 34 mg/dL — ABNORMAL HIGH (ref 7–25)
CO2: 30 mmol/L (ref 20–32)
Calcium: 9.2 mg/dL (ref 8.6–10.3)
Chloride: 106 mmol/L (ref 98–110)
Creat: 1.28 mg/dL — ABNORMAL HIGH (ref 0.70–1.22)
Globulin: 3.4 g/dL (ref 1.9–3.7)
Glucose, Bld: 106 mg/dL — ABNORMAL HIGH (ref 65–99)
Potassium: 4 mmol/L (ref 3.5–5.3)
Sodium: 141 mmol/L (ref 135–146)
Total Bilirubin: 0.4 mg/dL (ref 0.2–1.2)
Total Protein: 7.5 g/dL (ref 6.1–8.1)

## 2024-03-17 LAB — CBC WITH DIFFERENTIAL/PLATELET
Absolute Lymphocytes: 1983 {cells}/uL (ref 850–3900)
Absolute Monocytes: 623 {cells}/uL (ref 200–950)
Basophils Absolute: 47 {cells}/uL (ref 0–200)
Basophils Relative: 0.7 %
Eosinophils Absolute: 147 {cells}/uL (ref 15–500)
Eosinophils Relative: 2.2 %
HCT: 37.7 % — ABNORMAL LOW (ref 38.5–50.0)
Hemoglobin: 11.9 g/dL — ABNORMAL LOW (ref 13.2–17.1)
MCH: 27.4 pg (ref 27.0–33.0)
MCHC: 31.6 g/dL — ABNORMAL LOW (ref 32.0–36.0)
MCV: 86.9 fL (ref 80.0–100.0)
MPV: 10.8 fL (ref 7.5–12.5)
Monocytes Relative: 9.3 %
Neutro Abs: 3899 {cells}/uL (ref 1500–7800)
Neutrophils Relative %: 58.2 %
Platelets: 206 10*3/uL (ref 140–400)
RBC: 4.34 10*6/uL (ref 4.20–5.80)
RDW: 13.3 % (ref 11.0–15.0)
Total Lymphocyte: 29.6 %
WBC: 6.7 10*3/uL (ref 3.8–10.8)

## 2024-03-17 LAB — MICROALBUMIN / CREATININE URINE RATIO
Creatinine, Urine: 88 mg/dL (ref 20–320)
Microalb Creat Ratio: 9 mg/g{creat} (ref <30)
Microalb, Ur: 0.8 mg/dL

## 2024-03-17 LAB — VITAMIN D 25 HYDROXY (VIT D DEFICIENCY, FRACTURES): Vit D, 25-Hydroxy: 59 ng/mL (ref 30–100)

## 2024-03-17 LAB — PTH, INTACT AND CALCIUM
Calcium: 9.1 mg/dL (ref 8.6–10.3)
PTH: 32 pg/mL (ref 16–77)

## 2024-03-17 LAB — CK: Total CK: 43 U/L (ref 17–247)

## 2024-03-18 DIAGNOSIS — R278 Other lack of coordination: Secondary | ICD-10-CM | POA: Diagnosis not present

## 2024-03-18 DIAGNOSIS — R296 Repeated falls: Secondary | ICD-10-CM | POA: Diagnosis not present

## 2024-03-18 DIAGNOSIS — Z741 Need for assistance with personal care: Secondary | ICD-10-CM | POA: Diagnosis not present

## 2024-03-18 DIAGNOSIS — H3530 Unspecified macular degeneration: Secondary | ICD-10-CM | POA: Diagnosis not present

## 2024-03-18 DIAGNOSIS — R4189 Other symptoms and signs involving cognitive functions and awareness: Secondary | ICD-10-CM | POA: Diagnosis not present

## 2024-03-18 DIAGNOSIS — Z9181 History of falling: Secondary | ICD-10-CM | POA: Diagnosis not present

## 2024-03-19 DIAGNOSIS — R4189 Other symptoms and signs involving cognitive functions and awareness: Secondary | ICD-10-CM | POA: Diagnosis not present

## 2024-03-19 DIAGNOSIS — H3530 Unspecified macular degeneration: Secondary | ICD-10-CM | POA: Diagnosis not present

## 2024-03-19 DIAGNOSIS — R278 Other lack of coordination: Secondary | ICD-10-CM | POA: Diagnosis not present

## 2024-03-19 DIAGNOSIS — Z741 Need for assistance with personal care: Secondary | ICD-10-CM | POA: Diagnosis not present

## 2024-03-19 DIAGNOSIS — Z9181 History of falling: Secondary | ICD-10-CM | POA: Diagnosis not present

## 2024-03-19 DIAGNOSIS — R296 Repeated falls: Secondary | ICD-10-CM | POA: Diagnosis not present

## 2024-03-20 ENCOUNTER — Encounter: Payer: Self-pay | Admitting: Student

## 2024-03-23 DIAGNOSIS — R278 Other lack of coordination: Secondary | ICD-10-CM | POA: Diagnosis not present

## 2024-03-23 DIAGNOSIS — R296 Repeated falls: Secondary | ICD-10-CM | POA: Diagnosis not present

## 2024-03-23 DIAGNOSIS — H353123 Nonexudative age-related macular degeneration, left eye, advanced atrophic without subfoveal involvement: Secondary | ICD-10-CM | POA: Diagnosis not present

## 2024-03-23 DIAGNOSIS — Z741 Need for assistance with personal care: Secondary | ICD-10-CM | POA: Diagnosis not present

## 2024-03-23 DIAGNOSIS — H3530 Unspecified macular degeneration: Secondary | ICD-10-CM | POA: Diagnosis not present

## 2024-03-23 DIAGNOSIS — R4189 Other symptoms and signs involving cognitive functions and awareness: Secondary | ICD-10-CM | POA: Diagnosis not present

## 2024-03-23 DIAGNOSIS — Z9181 History of falling: Secondary | ICD-10-CM | POA: Diagnosis not present

## 2024-03-24 DIAGNOSIS — H3530 Unspecified macular degeneration: Secondary | ICD-10-CM | POA: Diagnosis not present

## 2024-03-24 DIAGNOSIS — Z741 Need for assistance with personal care: Secondary | ICD-10-CM | POA: Diagnosis not present

## 2024-03-24 DIAGNOSIS — Z9181 History of falling: Secondary | ICD-10-CM | POA: Diagnosis not present

## 2024-03-24 DIAGNOSIS — R4189 Other symptoms and signs involving cognitive functions and awareness: Secondary | ICD-10-CM | POA: Diagnosis not present

## 2024-03-24 DIAGNOSIS — R278 Other lack of coordination: Secondary | ICD-10-CM | POA: Diagnosis not present

## 2024-03-24 DIAGNOSIS — R296 Repeated falls: Secondary | ICD-10-CM | POA: Diagnosis not present

## 2024-03-25 DIAGNOSIS — R278 Other lack of coordination: Secondary | ICD-10-CM | POA: Diagnosis not present

## 2024-03-25 DIAGNOSIS — R4189 Other symptoms and signs involving cognitive functions and awareness: Secondary | ICD-10-CM | POA: Diagnosis not present

## 2024-03-25 DIAGNOSIS — Z741 Need for assistance with personal care: Secondary | ICD-10-CM | POA: Diagnosis not present

## 2024-03-25 DIAGNOSIS — H3530 Unspecified macular degeneration: Secondary | ICD-10-CM | POA: Diagnosis not present

## 2024-03-25 DIAGNOSIS — R296 Repeated falls: Secondary | ICD-10-CM | POA: Diagnosis not present

## 2024-03-25 DIAGNOSIS — Z9181 History of falling: Secondary | ICD-10-CM | POA: Diagnosis not present

## 2024-03-26 DIAGNOSIS — Z9181 History of falling: Secondary | ICD-10-CM | POA: Diagnosis not present

## 2024-03-26 DIAGNOSIS — R296 Repeated falls: Secondary | ICD-10-CM | POA: Diagnosis not present

## 2024-03-26 DIAGNOSIS — H3530 Unspecified macular degeneration: Secondary | ICD-10-CM | POA: Diagnosis not present

## 2024-03-26 DIAGNOSIS — Z741 Need for assistance with personal care: Secondary | ICD-10-CM | POA: Diagnosis not present

## 2024-03-26 DIAGNOSIS — R278 Other lack of coordination: Secondary | ICD-10-CM | POA: Diagnosis not present

## 2024-03-26 DIAGNOSIS — R4189 Other symptoms and signs involving cognitive functions and awareness: Secondary | ICD-10-CM | POA: Diagnosis not present

## 2024-03-30 ENCOUNTER — Ambulatory Visit: Payer: Medicare Other | Admitting: Student

## 2024-03-30 DIAGNOSIS — Z9181 History of falling: Secondary | ICD-10-CM | POA: Diagnosis not present

## 2024-03-30 DIAGNOSIS — R278 Other lack of coordination: Secondary | ICD-10-CM | POA: Diagnosis not present

## 2024-03-30 DIAGNOSIS — H3530 Unspecified macular degeneration: Secondary | ICD-10-CM | POA: Diagnosis not present

## 2024-03-30 DIAGNOSIS — Z741 Need for assistance with personal care: Secondary | ICD-10-CM | POA: Diagnosis not present

## 2024-03-30 DIAGNOSIS — R296 Repeated falls: Secondary | ICD-10-CM | POA: Diagnosis not present

## 2024-03-30 DIAGNOSIS — H353221 Exudative age-related macular degeneration, left eye, with active choroidal neovascularization: Secondary | ICD-10-CM | POA: Diagnosis not present

## 2024-03-30 DIAGNOSIS — R4189 Other symptoms and signs involving cognitive functions and awareness: Secondary | ICD-10-CM | POA: Diagnosis not present

## 2024-03-31 DIAGNOSIS — Z9181 History of falling: Secondary | ICD-10-CM | POA: Diagnosis not present

## 2024-03-31 DIAGNOSIS — R296 Repeated falls: Secondary | ICD-10-CM | POA: Diagnosis not present

## 2024-03-31 DIAGNOSIS — Z741 Need for assistance with personal care: Secondary | ICD-10-CM | POA: Diagnosis not present

## 2024-03-31 DIAGNOSIS — R4189 Other symptoms and signs involving cognitive functions and awareness: Secondary | ICD-10-CM | POA: Diagnosis not present

## 2024-03-31 DIAGNOSIS — H3530 Unspecified macular degeneration: Secondary | ICD-10-CM | POA: Diagnosis not present

## 2024-03-31 DIAGNOSIS — R278 Other lack of coordination: Secondary | ICD-10-CM | POA: Diagnosis not present

## 2024-04-01 ENCOUNTER — Other Ambulatory Visit: Payer: Self-pay | Admitting: Student

## 2024-04-01 DIAGNOSIS — D649 Anemia, unspecified: Secondary | ICD-10-CM

## 2024-04-01 DIAGNOSIS — R4189 Other symptoms and signs involving cognitive functions and awareness: Secondary | ICD-10-CM | POA: Diagnosis not present

## 2024-04-01 DIAGNOSIS — Z9181 History of falling: Secondary | ICD-10-CM | POA: Diagnosis not present

## 2024-04-01 DIAGNOSIS — H3530 Unspecified macular degeneration: Secondary | ICD-10-CM | POA: Diagnosis not present

## 2024-04-01 DIAGNOSIS — Z741 Need for assistance with personal care: Secondary | ICD-10-CM | POA: Diagnosis not present

## 2024-04-01 DIAGNOSIS — R278 Other lack of coordination: Secondary | ICD-10-CM | POA: Diagnosis not present

## 2024-04-01 DIAGNOSIS — R296 Repeated falls: Secondary | ICD-10-CM | POA: Diagnosis not present

## 2024-04-01 NOTE — Progress Notes (Signed)
 Repeat labs from recent anemia.

## 2024-04-06 ENCOUNTER — Ambulatory Visit: Payer: Medicare Other | Admitting: Student

## 2024-04-07 DIAGNOSIS — R296 Repeated falls: Secondary | ICD-10-CM | POA: Diagnosis not present

## 2024-04-07 DIAGNOSIS — R4189 Other symptoms and signs involving cognitive functions and awareness: Secondary | ICD-10-CM | POA: Diagnosis not present

## 2024-04-07 DIAGNOSIS — M1711 Unilateral primary osteoarthritis, right knee: Secondary | ICD-10-CM | POA: Diagnosis not present

## 2024-04-07 DIAGNOSIS — R2689 Other abnormalities of gait and mobility: Secondary | ICD-10-CM | POA: Diagnosis not present

## 2024-04-07 DIAGNOSIS — Z741 Need for assistance with personal care: Secondary | ICD-10-CM | POA: Diagnosis not present

## 2024-04-07 DIAGNOSIS — M48061 Spinal stenosis, lumbar region without neurogenic claudication: Secondary | ICD-10-CM | POA: Diagnosis not present

## 2024-04-07 DIAGNOSIS — R278 Other lack of coordination: Secondary | ICD-10-CM | POA: Diagnosis not present

## 2024-04-07 DIAGNOSIS — Z9181 History of falling: Secondary | ICD-10-CM | POA: Diagnosis not present

## 2024-04-07 DIAGNOSIS — M6281 Muscle weakness (generalized): Secondary | ICD-10-CM | POA: Diagnosis not present

## 2024-04-07 DIAGNOSIS — H3530 Unspecified macular degeneration: Secondary | ICD-10-CM | POA: Diagnosis not present

## 2024-04-08 ENCOUNTER — Other Ambulatory Visit: Payer: Self-pay | Admitting: Student

## 2024-04-08 ENCOUNTER — Ambulatory Visit: Admitting: Student

## 2024-04-08 ENCOUNTER — Encounter: Payer: Self-pay | Admitting: Student

## 2024-04-08 VITALS — BP 124/66 | HR 75 | Temp 98.2°F | Ht 66.5 in | Wt 178.0 lb

## 2024-04-08 DIAGNOSIS — G5702 Lesion of sciatic nerve, left lower limb: Secondary | ICD-10-CM

## 2024-04-08 MED ORDER — PREDNISONE 20 MG PO TABS: 20.0000 mg | ORAL_TABLET | Freq: Every day | ORAL | 0 refills | Status: AC

## 2024-04-08 NOTE — Patient Instructions (Signed)
 VISIT SUMMARY:  Today, you were seen for left buttock and leg pain that worsens when sitting in certain positions, especially in your recliner. We discussed your symptoms, possible causes, and treatment options.  YOUR PLAN:  -PIRIFORMIS SYNDROME: Piriformis syndrome occurs when the piriformis muscle in the buttock irritates the sciatic nerve, causing pain. We suspect this is the cause of your pain, which worsens in certain sitting positions. We have prescribed prednisone 20 mg daily for 5 days to reduce inflammation. Continue with gluteal exercises to help loosen the muscles, and try to stand up every 30 minutes to an hour when sitting for long periods. If symptoms persist, consider massage therapy or acupuncture. We will schedule a follow-up appointment sooner than 3 months to see how you are responding to the treatment.  INSTRUCTIONS:  Please take prednisone 20 mg daily for 5 days as prescribed. Continue with your gluteal exercises and try to stand up every 30 minutes to an hour when sitting for long periods. If your symptoms do not improve, consider trying massage therapy or acupuncture. We will see you again sooner than 3 months to check on your progress.

## 2024-04-09 DIAGNOSIS — R300 Dysuria: Secondary | ICD-10-CM | POA: Diagnosis not present

## 2024-04-09 DIAGNOSIS — D649 Anemia, unspecified: Secondary | ICD-10-CM | POA: Diagnosis not present

## 2024-04-09 NOTE — Progress Notes (Signed)
 [facility]  Provider: Jann Melody  Code Status: DNR Goals of Care:     03/13/2024    4:09 PM  Advanced Directives  Does Patient Have a Medical Advance Directive? Yes  Type of Estate agent of [place];Living will;Out of facility DNR (pink MOST or yellow form)  Does patient want to make changes to medical advance directive? No - Patient declined  Copy of Healthcare Power of Attorney in Chart? Yes - validated most recent copy scanned in chart (See row information)     Chief Complaint  Patient presents with   Medical Management of Chronic Issues    Medical Management of Chronic Issues. 1 Month Follow up. Complains of more Left Hip pain.     HPI: Patient is a 89 y.o. male seen today for an acute f/u.  Discussed the use of AI scribe software for clinical note transcription with the patient, who gave verbal consent to proceed.  History of Present Illness   Juan Horn is an 89 year old male who presents for follow up of left buttock and leg pain.  He experiences pain primarily in his left buttock, which sometimes radiates down his leg. The pain is most intense when walking or sitting in certain positions, particularly in his recliner, which he describes as feeling like 'sitting down in a hole.' The pain is alleviated when he sits in his power wheelchair, which has a hard seat. The pain does not occur when crossing his left leg over and is absent when he is not in the recliner.  He has tried exercises in the past but has not been consistent due to discomfort. He previously attended physical therapy for balance issues but is not currently participating in any therapy. He has been taking extra strength Tylenol , two tablets in the morning and two at night, but it does not significantly affect the pain. He has not used heating pads or other pain relief methods.  He recalls an incident at the gym where he experienced pain after  performing leg curls, which led him to stop the exercise. No issues with urinary or bowel incontinence. His caregiver assists him with showering and other daily activities, indicating a need for support due to mobility issues. He spends significant time sitting, often at his computer, and acknowledges that he should stand more frequently to alleviate discomfort.        Past Medical History:  Diagnosis Date   Actinic keratosis    Anxiety    BCC (basal cell carcinoma of skin) 07/24/2021   Left upper forehead, EDC   Cancer (HCC) 2010   Prostate Cancer   Cataract    left eye   Chronic renal disease, stage III (HCC)    Chronic venous insufficiency    Clotting disorder (HCC) 2013   blood clot 3 days post knee surgery   Diverticulosis of colon    DVT (deep venous thrombosis) (HCC) 2012   after knee replacement   Glaucoma    HLD (hyperlipidemia)    Hx of basal cell carcinoma 12/23/2018   L nasal tip   Hx of basal cell carcinoma 12/23/2018   R preauricular   Hx of colonic polyp    Macular degeneration    legally blind in right eye   OA (osteoarthritis)    Personal history of prostate cancer    Pulmonary embolism (HCC) 10/2012   post op TKR   PVC (premature ventricular contraction)    Spinal stenosis  of lumbar region    Squamous cell carcinoma of skin 05/06/2018   L dorsal forearm near anticubital    Past Surgical History:  Procedure Laterality Date   APPENDECTOMY     CATARACT EXTRACTION W/PHACO Left 06/24/2019   Procedure: CATARACT EXTRACTION PHACO AND INTRAOCULAR LENS PLACEMENT (IOC) LEFT;  Surgeon: Annell Kidney, MD;  Location: [facility];  Service: Ophthalmology;  Laterality: Left;   COLONOSCOPY  2011   INGUINAL HERNIA REPAIR     left   INGUINAL HERNIA REPAIR  5/12   Dr Hosey Macadam   INGUINAL HERNIA REPAIR  5/12   Dr Lorel Roes did redo of this   JOINT REPLACEMENT  11/13   Left total knee--Dr [facility]   KNEE SURGERY  2013   POLYPECTOMY  2011    PROSTATECTOMY  2010   TONSILLECTOMY     VARICOSE VEIN SURGERY     left    Allergies  Allergen Reactions   Naphazoline-Polyethyl Glycol Other (See Comments)    (Afgan) redness    Outpatient Encounter Medications as of 04/08/2024  Medication Sig   acetaminophen  (TYLENOL ) 500 MG tablet Take 1,000 mg by mouth daily.   Ascorbic Acid (VITAMIN C PO) Take by mouth daily.   Cholecalciferol  (VITAMIN D3 PO) Take 1,000 Units by mouth daily.   erythromycin ophthalmic ointment Place 1 Application into both eyes at bedtime.   hydrocortisone 2.5 % cream Apply topically 2 (two) times daily as needed.   hydroxypropyl methylcellulose / hypromellose (ISOPTO TEARS / GONIOVISC) 2.5 % ophthalmic solution 1 drop as needed for dry eyes.   latanoprost  (XALATAN ) 0.005 % ophthalmic solution Place 1 drop into both eyes at bedtime.   Multiple Vitamins-Minerals (PRESERVISION AREDS 2 PO) Take 2 tablets by mouth daily.    predniSONE (DELTASONE) 20 MG tablet Take 1 tablet (20 mg total) by mouth daily with breakfast for 5 days.   pyridOXINE (VITAMIN B6) 25 MG tablet Take 25 mg by mouth daily.   sertraline  (ZOLOFT ) 100 MG tablet Take 1 tablet (100 mg total) by mouth daily.   Wound Dressings (MEPILEX) PADS Apply 1 Application topically every 3 (three) days.   No facility-administered encounter medications on file as of 04/08/2024.    Review of Systems:  Review of Systems  Health Maintenance  Topic Date Due   COVID-19 Vaccine (12 - 2024-25 season) 05/08/2024   Medicare Annual Wellness (AWV)  06/24/2024   INFLUENZA VACCINE  07/03/2024   DTaP/Tdap/Td (3 - Td or Tdap) 10/06/2032   Pneumonia Vaccine 6+ Years old  Completed   Zoster Vaccines- Shingrix  Completed   HPV VACCINES  Aged Out   Meningococcal B Vaccine  Aged Out    Physical Exam: Vitals:   04/08/24 1534  BP: 124/66  Pulse: 75  Temp: 98.2 F (36.8 C)  SpO2: 99%  Weight: 178 lb (80.7 kg)  Height: 5' 6.5" (1.689 m)   Body mass index is 28.3  kg/m. Physical Exam Physical Exam   MUSCULOSKELETAL: Left buttock pain on palpation and tightness in left hip.       Labs reviewed: Basic Metabolic Panel: Recent Labs    06/20/23 0736 12/30/23 0758 03/16/24 0753  NA 138 139 141  K 4.1 4.2 4.0  CL 102 102 106  CO2 28 31 30   GLUCOSE 158* 85 106*  BUN 24 33* 34*  CREATININE 1.41* 1.36* 1.28*  CALCIUM 9.2 9.4  9.4 9.1  9.2  PHOS  --   --  3.7  TSH  --  2.77  --  Liver Function Tests: Recent Labs    12/30/23 0758 03/16/24 0753  AST 17 17  ALT 23 25  BILITOT 0.7 0.4  PROT 7.5 7.5   No results for input(s): "LIPASE", "AMYLASE" in the last 8760 hours. No results for input(s): "AMMONIA" in the last 8760 hours. CBC: Recent Labs    06/20/23 0736 12/30/23 0758 03/16/24 0753  WBC 7.1 7.7 6.7  NEUTROABS 4,551 4,951 3,899  HGB 14.0 14.0 11.9*  HCT 42.6 44.4 37.7*  MCV 89.9 86.2 86.9  PLT 182 196 206   Lipid Panel: Recent Labs    12/30/23 0758  CHOL 187  HDL 52  LDLCALC 112*  TRIG 122  CHOLHDL 3.6   No results found for: "HGBA1C"  Procedures since last visit: No results found. Results           Assessment/Plan    Piriformis syndrome Chronic left buttock pain radiating down the leg, exacerbated by certain sitting positions, particularly in a recliner, and alleviated by sitting in a wheelchair with a hard seat. No urinary or stool incontinence or calf pain. Suspected piriformis syndrome due to gluteal muscle tightness compressing the sciatic nerve. Differential diagnosis includes spinal stenosis and sciatica. Previous physical therapy for balance, but not currently engaged in therapy for this issue. Tylenol  is ineffective. Discussed oral steroids and exercises to alleviate symptoms. Massage and acupuncture mentioned but not preferred due to cost and discomfort. Informed consent obtained for a short course of prednisone, discussing its potential to decrease inflammation without significantly impacting fall  risk or causing confusion. He has a high pain tolerance and prefers to avoid massage therapy. - Prescribe prednisone 20 mg daily for 5 days. - Encourage continuation of gluteal exercises to loosen muscles. - Advise standing up every 30 minutes to an hour when sitting for long periods. - Consider massage therapy or acupuncture if symptoms persist. - Schedule follow-up appointment sooner than 3 months to assess response to treatment.        Labs/tests ordered:  * No order type specified * Next appt:  05/06/2024

## 2024-04-11 LAB — BASIC METABOLIC PANEL WITHOUT GFR
BUN/Creatinine Ratio: 24 (calc) — ABNORMAL HIGH (ref 6–22)
BUN: 29 mg/dL — ABNORMAL HIGH (ref 7–25)
CO2: 31 mmol/L (ref 20–32)
Calcium: 9.3 mg/dL (ref 8.6–10.3)
Chloride: 102 mmol/L (ref 98–110)
Creat: 1.22 mg/dL (ref 0.70–1.22)
Glucose, Bld: 88 mg/dL (ref 65–99)
Potassium: 4 mmol/L (ref 3.5–5.3)
Sodium: 139 mmol/L (ref 135–146)

## 2024-04-11 LAB — CBC WITH DIFFERENTIAL/PLATELET
Absolute Lymphocytes: 1632 {cells}/uL (ref 850–3900)
Absolute Monocytes: 481 {cells}/uL (ref 200–950)
Basophils Absolute: 52 {cells}/uL (ref 0–200)
Basophils Relative: 0.8 %
Eosinophils Absolute: 98 {cells}/uL (ref 15–500)
Eosinophils Relative: 1.5 %
HCT: 38.5 % (ref 38.5–50.0)
Hemoglobin: 12.3 g/dL — ABNORMAL LOW (ref 13.2–17.1)
MCH: 27.5 pg (ref 27.0–33.0)
MCHC: 31.9 g/dL — ABNORMAL LOW (ref 32.0–36.0)
MCV: 86.1 fL (ref 80.0–100.0)
MPV: 10.6 fL (ref 7.5–12.5)
Monocytes Relative: 7.4 %
Neutro Abs: 4238 {cells}/uL (ref 1500–7800)
Neutrophils Relative %: 65.2 %
Platelets: 199 10*3/uL (ref 140–400)
RBC: 4.47 10*6/uL (ref 4.20–5.80)
RDW: 14.3 % (ref 11.0–15.0)
Total Lymphocyte: 25.1 %
WBC: 6.5 10*3/uL (ref 3.8–10.8)

## 2024-04-11 LAB — URINALYSIS, ROUTINE W REFLEX MICROSCOPIC
Bilirubin Urine: NEGATIVE
Glucose, UA: NEGATIVE
Hgb urine dipstick: NEGATIVE
Ketones, ur: NEGATIVE
Leukocytes,Ua: NEGATIVE
Nitrite: NEGATIVE
Protein, ur: NEGATIVE
Specific Gravity, Urine: 1.015 (ref 1.001–1.035)
pH: <5 — ABNORMAL LOW (ref 5.0–8.0)

## 2024-04-11 LAB — URINE CULTURE
MICRO NUMBER:: 16430402
SPECIMEN QUALITY:: ADEQUATE

## 2024-04-11 LAB — IRON,TIBC AND FERRITIN PANEL
%SAT: 16 % — ABNORMAL LOW (ref 20–48)
Ferritin: 27 ng/mL (ref 24–380)
Iron: 58 ug/dL (ref 50–180)
TIBC: 352 ug/dL (ref 250–425)

## 2024-04-13 ENCOUNTER — Encounter: Payer: Self-pay | Admitting: Student

## 2024-04-14 ENCOUNTER — Ambulatory Visit: Payer: Self-pay

## 2024-04-14 DIAGNOSIS — Z9181 History of falling: Secondary | ICD-10-CM | POA: Diagnosis not present

## 2024-04-14 DIAGNOSIS — R278 Other lack of coordination: Secondary | ICD-10-CM | POA: Diagnosis not present

## 2024-04-14 DIAGNOSIS — Z741 Need for assistance with personal care: Secondary | ICD-10-CM | POA: Diagnosis not present

## 2024-04-14 DIAGNOSIS — R296 Repeated falls: Secondary | ICD-10-CM | POA: Diagnosis not present

## 2024-04-14 DIAGNOSIS — H3530 Unspecified macular degeneration: Secondary | ICD-10-CM | POA: Diagnosis not present

## 2024-04-14 DIAGNOSIS — R4189 Other symptoms and signs involving cognitive functions and awareness: Secondary | ICD-10-CM | POA: Diagnosis not present

## 2024-04-14 NOTE — Telephone Encounter (Signed)
 See Triage Notes from Triage Nurse  Message sent to Valrie Gehrig, MD

## 2024-04-14 NOTE — Telephone Encounter (Signed)
  Chief Complaint: Left leg pain related to sciatica  Symptoms: Left leg pain Frequency: ongoing since last office visit Pertinent Negatives: Patient denies chest pain, difficulty breathing  Disposition: [] ED /[] Urgent Care (no appt availability in office) / [] Appointment(In office/virtual)/ []  [facility]/ [] Home Care/ [] Refused Recommended Disposition /[] [facility]/ [x]  Follow-up with PCP Additional Notes: Patient called in stating he was seen in office and diagnosed with sciatica of the left leg. Patient states he just finished prednisone  that was prescribed by PCP, and prednisone  did not help much. Patient is currently treating the pain with Tylenol , and states the pain ranges up to a 10 in pain. Patient looking for further advice on pain control. Please advise.    Copied from CRM [id number]. Topic: Clinical - Medical Advice >> Apr 14, 2024  3:28 PM Adrianna P wrote: Reason for CRM: Finishied 5 day prednisone , and  today sciatic pain is horrible, hurts to bend over and walk , he states taking 2 tylenol  twice a day isnt helping, he wants to know what he can do to help relieve pain Reason for Disposition  [1] MODERATE pain (e.g., interferes with normal activities, limping) AND [2] present > 3 days    Patient was just seen in office and diagnosed with Sciatica. Patient finished prednisone  yesterday.  Answer Assessment - Initial Assessment Questions 1. ONSET: "When did the pain start?"      Patient finished prednisone  yesterday 2. LOCATION: "Where is the pain located?"      Left leg 3. PAIN: "How bad is the pain?"    (Scale 1-10; or mild, moderate, severe)   -  MILD (1-3): doesn't interfere with normal activities    -  MODERATE (4-7): interferes with normal activities (e.g., work or school) or awakens from sleep, limping    -  SEVERE (8-10): excruciating pain, unable to do any normal activities, unable to walk     2, but ranges up to 10 5. CAUSE: "What do you think is  causing the leg pain?"     Sciatica 6. OTHER SYMPTOMS: "Do you have any other symptoms?" (e.g., chest pain, back pain, breathing difficulty, swelling, rash, fever, numbness, weakness)     No  Protocols used: Leg Pain-A-AH

## 2024-04-15 ENCOUNTER — Telehealth: Payer: Self-pay

## 2024-04-15 LAB — FECAL GLOBIN BY IMMUNOCHEMISTRY

## 2024-04-15 LAB — HOUSE ACCOUNT TRACKING

## 2024-04-15 NOTE — Telephone Encounter (Signed)
 Spoke with E2C2 Representative stating patient is still in pain since yesterday and would like to speak with Valrie Gehrig, MD. See Triage Notes from Triage Nurse on yesterday. Please advise.   Message sent to Valrie Gehrig, MD and May ,Anita A, [place]

## 2024-04-16 NOTE — Telephone Encounter (Signed)
 He took pain medication - tylenol  and prednisone  without improvement. Heating pads have helped the most. He sat on it for a lot of time and it seemed to help. He can move a bit more now. The first night he slept in the recliner. Today he slept in the bed. It's so much better now! Defer imaging at this time given improvement.

## 2024-04-21 DIAGNOSIS — Z741 Need for assistance with personal care: Secondary | ICD-10-CM | POA: Diagnosis not present

## 2024-04-21 DIAGNOSIS — Z9181 History of falling: Secondary | ICD-10-CM | POA: Diagnosis not present

## 2024-04-21 DIAGNOSIS — H3530 Unspecified macular degeneration: Secondary | ICD-10-CM | POA: Diagnosis not present

## 2024-04-21 DIAGNOSIS — R278 Other lack of coordination: Secondary | ICD-10-CM | POA: Diagnosis not present

## 2024-04-21 DIAGNOSIS — R296 Repeated falls: Secondary | ICD-10-CM | POA: Diagnosis not present

## 2024-04-21 DIAGNOSIS — R4189 Other symptoms and signs involving cognitive functions and awareness: Secondary | ICD-10-CM | POA: Diagnosis not present

## 2024-05-04 DIAGNOSIS — L820 Inflamed seborrheic keratosis: Secondary | ICD-10-CM | POA: Diagnosis not present

## 2024-05-04 DIAGNOSIS — L814 Other melanin hyperpigmentation: Secondary | ICD-10-CM | POA: Diagnosis not present

## 2024-05-04 DIAGNOSIS — L821 Other seborrheic keratosis: Secondary | ICD-10-CM | POA: Diagnosis not present

## 2024-05-05 DIAGNOSIS — R278 Other lack of coordination: Secondary | ICD-10-CM | POA: Diagnosis not present

## 2024-05-05 DIAGNOSIS — M48061 Spinal stenosis, lumbar region without neurogenic claudication: Secondary | ICD-10-CM | POA: Diagnosis not present

## 2024-05-05 DIAGNOSIS — H3530 Unspecified macular degeneration: Secondary | ICD-10-CM | POA: Diagnosis not present

## 2024-05-05 DIAGNOSIS — Z9181 History of falling: Secondary | ICD-10-CM | POA: Diagnosis not present

## 2024-05-05 DIAGNOSIS — R296 Repeated falls: Secondary | ICD-10-CM | POA: Diagnosis not present

## 2024-05-05 DIAGNOSIS — Z741 Need for assistance with personal care: Secondary | ICD-10-CM | POA: Diagnosis not present

## 2024-05-05 DIAGNOSIS — R4189 Other symptoms and signs involving cognitive functions and awareness: Secondary | ICD-10-CM | POA: Diagnosis not present

## 2024-05-05 DIAGNOSIS — M6281 Muscle weakness (generalized): Secondary | ICD-10-CM | POA: Diagnosis not present

## 2024-05-05 DIAGNOSIS — R2689 Other abnormalities of gait and mobility: Secondary | ICD-10-CM | POA: Diagnosis not present

## 2024-05-05 DIAGNOSIS — M1711 Unilateral primary osteoarthritis, right knee: Secondary | ICD-10-CM | POA: Diagnosis not present

## 2024-05-06 ENCOUNTER — Ambulatory Visit: Admitting: Student

## 2024-05-06 ENCOUNTER — Encounter: Payer: Self-pay | Admitting: Student

## 2024-05-06 VITALS — BP 126/74 | HR 77 | Temp 98.2°F | Ht 66.5 in | Wt 180.6 lb

## 2024-05-06 DIAGNOSIS — M1711 Unilateral primary osteoarthritis, right knee: Secondary | ICD-10-CM

## 2024-05-06 DIAGNOSIS — R2689 Other abnormalities of gait and mobility: Secondary | ICD-10-CM | POA: Diagnosis not present

## 2024-05-06 DIAGNOSIS — G8929 Other chronic pain: Secondary | ICD-10-CM

## 2024-05-06 DIAGNOSIS — M25511 Pain in right shoulder: Secondary | ICD-10-CM

## 2024-05-06 DIAGNOSIS — R490 Dysphonia: Secondary | ICD-10-CM | POA: Diagnosis not present

## 2024-05-06 NOTE — Patient Instructions (Addendum)
 Consider purchasing:  THE BREATHER ? [employer], operational For Drug-Free Respiratory Therapy ? Breathe Easier with Stronger Lungs ? Guided Mobile Training App Included  For youtube - look up speech therapy for vocal cord weakness.   VISIT SUMMARY:  During your visit, we discussed your mobility issues and vocal changes. You shared that you use a scooter and a walker without wheels for safety, and you are working on maintaining your exercise routine. We also talked about your concerns regarding your vocal changes and the effort it takes to speak loudly.  YOUR PLAN:  -KNEE PAIN DUE TO ARTHRITIS: Your knee pain is due to arthritis, which is causing discomfort during exercises but is not severe enough to limit your activities. Continue with your gym exercises, including leg curls and leg extensions, three times a week. Use a weight that is challenging for eight repetitions.  -MOBILITY ISSUES: You have mobility issues and use a scooter and a walker without wheels for safety. It is important to maintain your range of motion and strength. Continue your exercises to prevent further decline in mobility. Consider installing a grab bar in your garage for additional support.  -SHOULDER PAIN: Your shoulder pain was previously managed with prednisone , which provided temporary relief. Currently, you are not experiencing any shoulder pain.  -VOCAL CHANGES: Your vocal cord weakness is causing reduced volume and effort in your speech. This may be due to muscle weakness affecting your vocal cords. Breathing techniques and exercises can help strengthen your voice. Research vocal cord exercises online and consider speech therapy if needed. An acapella device may also help strengthen your oropharyngeal muscles and prevent aspiration.  INSTRUCTIONS:  Please continue with your current exercise routine and consider installing a grab bar in your garage for support. Research vocal cord exercises online and  consider speech therapy if you find the online resources insufficient. Additionally, look into using an acapella device to help strengthen your oropharyngeal muscles.

## 2024-05-06 NOTE — Progress Notes (Signed)
 [facility]: [facility] Provider: Jann Melody  Code Status: DNR Goals of Care:     03/13/2024    4:09 PM  Advanced Directives  Does Patient Have a Medical Advance Directive? Yes  Type of Estate agent of [place];Living will;Out of facility DNR (pink MOST or yellow form)  Does patient want to make changes to medical advance directive? No - Patient declined  Copy of Healthcare Power of Attorney in Chart? Yes - validated most recent copy scanned in chart (See row information)     Chief Complaint  Patient presents with   Medical Management of Chronic Issues    Medical Management of Chronic Issues.     HPI: Patient is a 89 y.o. male seen today for medical management of chronic diseases.   Discussed the use of AI scribe software for clinical note transcription with the patient, who gave verbal consent to proceed.  History of Present Illness   Juan Horn is an 89 year old male who presents with mobility issues and vocal changes.  He uses a scooter for mobility and has a walker at home without wheels, which he finds safest. He can walk, but it is not 'pretty' and he resists walking too much. He also has a wheelchair. He previously used a heating pad for pain relief, sitting on it for about 18 hours a day for a week, but is not using it currently. He completed a course of prednisone  a month ago for pain, which helped initially, but the pain returned after finishing the medication.  He describes having shoulder pain, which is not currently present, and mentions that he has no pain during exercises except for some knee discomfort due to arthritis. He is concerned about his mobility and is trying to get back to exercising, including leg curls and extensions with 50 pounds of weight, and leg presses. He has not used the NuStep machine for a while but plans to resume.  He is concerned about his vocal changes, noting that he is not talking as  loudly as before and sometimes feels like he is mumbling. He is self-conscious about his ability to talk and mentions that it takes more effort to get words out. He feels like he is 'getting lazy' with his voice and is aware that he can talk louder if he tries. He has noticed these changes over the last few years.         Past Medical History:  Diagnosis Date   Actinic keratosis    Anxiety    BCC (basal cell carcinoma of skin) 07/24/2021   Left upper forehead, EDC   Cancer (HCC) 2010   Prostate Cancer   Cataract    left eye   Chronic renal disease, stage III (HCC)    Chronic venous insufficiency    Clotting disorder (HCC) 2013   blood clot 3 days post knee surgery   Diverticulosis of colon    DVT (deep venous thrombosis) (HCC) 2012   after knee replacement   Glaucoma    HLD (hyperlipidemia)    Hx of basal cell carcinoma 12/23/2018   L nasal tip   Hx of basal cell carcinoma 12/23/2018   R preauricular   Hx of colonic polyp    Macular degeneration    legally blind in right eye   OA (osteoarthritis)    Personal history of prostate cancer    Pulmonary embolism (HCC) 10/2012   post op TKR  PVC (premature ventricular contraction)    Spinal stenosis of lumbar region    Squamous cell carcinoma of skin 05/06/2018   L dorsal forearm near anticubital    Past Surgical History:  Procedure Laterality Date   APPENDECTOMY     CATARACT EXTRACTION W/PHACO Left 06/24/2019   Procedure: CATARACT EXTRACTION PHACO AND INTRAOCULAR LENS PLACEMENT (IOC) LEFT;  Surgeon: Annell Kidney, MD;  Location: [facility];  Service: Ophthalmology;  Laterality: Left;   COLONOSCOPY  2011   INGUINAL HERNIA REPAIR     left   INGUINAL HERNIA REPAIR  5/12   Dr Hosey Macadam   INGUINAL HERNIA REPAIR  5/12   Dr Lorel Roes did redo of this   JOINT REPLACEMENT  11/13   Left total knee--Dr [facility]   KNEE SURGERY  2013   POLYPECTOMY  2011   PROSTATECTOMY  2010   TONSILLECTOMY     VARICOSE VEIN  SURGERY     left    Allergies  Allergen Reactions   Naphazoline-Polyethyl Glycol Other (See Comments)    (Afgan) redness    Outpatient Encounter Medications as of 05/06/2024  Medication Sig   acetaminophen  (TYLENOL ) 500 MG tablet Take 1,000 mg by mouth daily.   Ascorbic Acid (VITAMIN C PO) Take by mouth daily.   Cholecalciferol  (VITAMIN D3 PO) Take 1,000 Units by mouth daily.   erythromycin ophthalmic ointment Place 1 Application into both eyes at bedtime.   hydrocortisone 2.5 % cream Apply topically 2 (two) times daily as needed.   hydroxypropyl methylcellulose / hypromellose (ISOPTO TEARS / GONIOVISC) 2.5 % ophthalmic solution 1 drop as needed for dry eyes.   latanoprost  (XALATAN ) 0.005 % ophthalmic solution Place 1 drop into both eyes at bedtime.   Multiple Vitamins-Minerals (PRESERVISION AREDS 2 PO) Take 2 tablets by mouth daily.    pyridOXINE (VITAMIN B6) 25 MG tablet Take 25 mg by mouth daily.   sertraline  (ZOLOFT ) 100 MG tablet Take 1 tablet (100 mg total) by mouth daily.   Wound Dressings (MEPILEX) PADS Apply 1 Application topically every 3 (three) days.   No facility-administered encounter medications on file as of 05/06/2024.    Review of Systems:  Review of Systems  Health Maintenance  Topic Date Due   COVID-19 Vaccine (12 - 2024-25 season) 05/08/2024   Medicare Annual Wellness (AWV)  06/24/2024   INFLUENZA VACCINE  07/03/2024   DTaP/Tdap/Td (3 - Td or Tdap) 10/06/2032   Pneumonia Vaccine 69+ Years old  Completed   Zoster Vaccines- Shingrix  Completed   HPV VACCINES  Aged Out   Meningococcal B Vaccine  Aged Out    Physical Exam: Vitals:   05/06/24 1403  BP: 126/74  Pulse: 77  Temp: 98.2 F (36.8 C)  SpO2: 97%  Weight: 180 lb 9.6 oz (81.9 kg)  Height: 5' 6.5" (1.689 m)   Body mass index is 28.71 kg/m. Physical Exam Constitutional:      Comments: In scooter  Cardiovascular:     Rate and Rhythm: Normal rate.     Pulses: Normal pulses.  Pulmonary:      Effort: Pulmonary effort is normal.  Neurological:     Mental Status: He is alert.   Labs reviewed: Basic Metabolic Panel: Recent Labs    12/30/23 0758 03/16/24 0753 04/09/24 0825  NA 139 141 139  K 4.2 4.0 4.0  CL 102 106 102  CO2 31 30 31   GLUCOSE 85 106* 88  BUN 33* 34* 29*  CREATININE 1.36* 1.28* 1.22  CALCIUM 9.4  9.4 9.1  9.2 9.3  PHOS  --  3.7  --   TSH 2.77  --   --    Liver Function Tests: Recent Labs    12/30/23 0758 03/16/24 0753  AST 17 17  ALT 23 25  BILITOT 0.7 0.4  PROT 7.5 7.5   No results for input(s): "LIPASE", "AMYLASE" in the last 8760 hours. No results for input(s): "AMMONIA" in the last 8760 hours. CBC: Recent Labs    12/30/23 0758 03/16/24 0753 04/09/24 0825  WBC 7.7 6.7 6.5  NEUTROABS 4,951 3,899 4,238  HGB 14.0 11.9* 12.3*  HCT 44.4 37.7* 38.5  MCV 86.2 86.9 86.1  PLT 196 206 199   Lipid Panel: Recent Labs    12/30/23 0758  CHOL 187  HDL 52  LDLCALC 112*  TRIG 122  CHOLHDL 3.6   No results found for: "HGBA1C"  Procedures since last visit: No results found. Results          Assessment/Plan     Knee pain due to arthritis Knee pain is attributed to arthritis with discomfort during exercises, not severe enough to limit activities.  Mobility issues Mobility issues are present, with use of a scooter and a walker without wheels for safety. There is concern about maintaining range of motion and strength. He is encouraged to continue exercises to prevent further decline in mobility. - Encourage gym exercises, including leg curl and leg extension, three times a week. - Advise performing exercises with a weight challenging for eight repetitions. - Recommend installation of a grab bar in the garage for support.  Shoulder pain Shoulder pain was previously managed with prednisone , providing temporary relief. Currently, no pain is reported.  Vocal cord weakness Vocal cord weakness with reduced volume and effort in speech.  Concern about muscle weakness affecting vocal cords. Breathing techniques and exercises may help strengthen the voice. Use of an acapella device is suggested to strengthen oropharyngeal muscles and prevent aspiration. - Recommend researching vocal cord exercises online per patient preference. Discussed evaluation with speech therapy and declined.  - Suggest considering speech therapy if online resources are insufficient. - Provide information on acapella device for strengthening oropharyngeal muscles.       I spent greater than 30 minutes for the care of this patient in face to face time, chart review, clinical documentation, patient education.     Labs/tests ordered:  * No order type specified * Next appt:  06/30/2024

## 2024-05-18 DIAGNOSIS — H353123 Nonexudative age-related macular degeneration, left eye, advanced atrophic without subfoveal involvement: Secondary | ICD-10-CM | POA: Diagnosis not present

## 2024-06-15 DIAGNOSIS — I7091 Generalized atherosclerosis: Secondary | ICD-10-CM | POA: Diagnosis not present

## 2024-06-15 DIAGNOSIS — B351 Tinea unguium: Secondary | ICD-10-CM | POA: Diagnosis not present

## 2024-06-16 DIAGNOSIS — Z961 Presence of intraocular lens: Secondary | ICD-10-CM | POA: Diagnosis not present

## 2024-06-16 DIAGNOSIS — H401131 Primary open-angle glaucoma, bilateral, mild stage: Secondary | ICD-10-CM | POA: Diagnosis not present

## 2024-06-16 DIAGNOSIS — H16213 Exposure keratoconjunctivitis, bilateral: Secondary | ICD-10-CM | POA: Diagnosis not present

## 2024-06-26 DIAGNOSIS — H353221 Exudative age-related macular degeneration, left eye, with active choroidal neovascularization: Secondary | ICD-10-CM | POA: Diagnosis not present

## 2024-06-30 ENCOUNTER — Encounter: Payer: Self-pay | Admitting: Nurse Practitioner

## 2024-06-30 ENCOUNTER — Ambulatory Visit: Payer: Medicare Other | Admitting: Nurse Practitioner

## 2024-06-30 VITALS — BP 122/84 | HR 72 | Temp 98.1°F | Ht 66.5 in | Wt 181.0 lb

## 2024-06-30 DIAGNOSIS — Z0000 Encounter for general adult medical examination without abnormal findings: Secondary | ICD-10-CM | POA: Diagnosis not present

## 2024-06-30 NOTE — Patient Instructions (Addendum)
  Mr. Schexnayder , Thank you for taking time to come for your Medicare Wellness Visit. I appreciate your ongoing commitment to your health goals. Please review the following plan we discussed and let me know if I can assist you in the future.    This is a list of the screening recommended for you and due dates:  Health Maintenance  Topic Date Due   Hepatitis B Vaccine (2 of 3 - 19+ 3-dose series) 03/14/2004   COVID-19 Vaccine (12 - 2024-25 season) 05/08/2024   Flu Shot  07/03/2024   Medicare Annual Wellness Visit  06/30/2025   DTaP/Tdap/Td vaccine (3 - Td or Tdap) 10/06/2032   Pneumococcal Vaccine for age over 108  Completed   Zoster (Shingles) Vaccine  Completed   HPV Vaccine  Aged Out   Meningitis B Vaccine  Aged Out

## 2024-06-30 NOTE — Progress Notes (Signed)
 Subjective:   Juan Horn is a 89 y.o. male who presents for Medicare Annual/Subsequent preventive examination.  Visit Complete: In person at [facility]    Cardiac Risk Factors include: advanced age (>26men, >64 women);dyslipidemia;sedentary lifestyle     Objective:    Today's Vitals   06/30/24 1004  BP: 122/84  Pulse: 72  Temp: 98.1 F (36.7 C)  SpO2: 97%  Weight: 181 lb (82.1 kg)  Height: 5' 6.5 (1.689 m)   Body mass index is 28.78 kg/m.     06/30/2024   10:08 AM 03/13/2024    4:09 PM 01/03/2024    1:58 PM 10/02/2023    1:29 PM 08/21/2023    2:30 PM 07/03/2023    1:56 PM 06/25/2023   10:09 AM  Advanced Directives  Does Patient Have a Medical Advance Directive? Yes Yes Yes Yes Yes Yes Yes  Type of Estate agent of [place];Living will;Out of facility DNR (pink MOST or yellow form) Healthcare Power of [place];Living will;Out of facility DNR (pink MOST or yellow form) Healthcare Power of [place];Out of facility DNR (pink MOST or yellow form);Living will Healthcare Power of [place];Out of facility DNR (pink MOST or yellow form);Living will Healthcare Power of [place];Out of facility DNR (pink MOST or yellow form);Living will Healthcare Power of [place];Out of facility DNR (pink MOST or yellow form);Living will Healthcare Power of [place];Out of facility DNR (pink MOST or yellow form);Living will  Does patient want to make changes to medical advance directive? No - Patient declined No - Patient declined No - Patient declined No - Patient declined No - Patient declined No - Patient declined No - Patient declined  Copy of Healthcare Power of Attorney in Chart? Yes - validated most recent copy scanned in chart (See row information) Yes - validated most recent copy scanned in chart (See row information) Yes - validated most recent copy scanned in chart (See row information) Yes - validated most recent copy scanned in chart (See row information) Yes  - validated most recent copy scanned in chart (See row information) Yes - validated most recent copy scanned in chart (See row information) Yes - validated most recent copy scanned in chart (See row information)    Current Medications (verified) Outpatient Encounter Medications as of 06/30/2024  Medication Sig   acetaminophen  (TYLENOL ) 500 MG tablet Take 1,000 mg by mouth daily.   Ascorbic Acid (VITAMIN C PO) Take by mouth daily.   Cholecalciferol  (VITAMIN D3 PO) Take 1,000 Units by mouth daily.   erythromycin ophthalmic ointment Place 1 Application into both eyes at bedtime.   hydrocortisone 2.5 % cream Apply topically 2 (two) times daily as needed.   hydroxypropyl methylcellulose / hypromellose (ISOPTO TEARS / GONIOVISC) 2.5 % ophthalmic solution 1 drop as needed for dry eyes.   latanoprost  (XALATAN ) 0.005 % ophthalmic solution Place 1 drop into both eyes at bedtime.   Multiple Vitamins-Minerals (PRESERVISION AREDS 2 PO) Take 2 tablets by mouth daily.    pyridOXINE (VITAMIN B6) 25 MG tablet Take 25 mg by mouth daily.   sertraline  (ZOLOFT ) 100 MG tablet Take 1 tablet (100 mg total) by mouth daily.   Wound Dressings (MEPILEX) PADS Apply 1 Application topically every 3 (three) days.   No facility-administered encounter medications on file as of 06/30/2024.    Allergies (verified) Naphazoline-polyethyl glycol   History: Past Medical History:  Diagnosis Date   Actinic keratosis    Anxiety    BCC (basal cell carcinoma of skin) 07/24/2021  Left upper forehead, EDC   Cancer (HCC) 2010   Prostate Cancer   Cataract    left eye   Chronic renal disease, stage III (HCC)    Chronic venous insufficiency    Clotting disorder (HCC) 2013   blood clot 3 days post knee surgery   Diverticulosis of colon    DVT (deep venous thrombosis) (HCC) 2012   after knee replacement   Glaucoma    HLD (hyperlipidemia)    Hx of basal cell carcinoma 12/23/2018   L nasal tip   Hx of basal cell carcinoma  12/23/2018   R preauricular   Hx of colonic polyp    Macular degeneration    legally blind in right eye   OA (osteoarthritis)    Personal history of prostate cancer    Pulmonary embolism (HCC) 10/2012   post op TKR   PVC (premature ventricular contraction)    Spinal stenosis of lumbar region    Squamous cell carcinoma of skin 05/06/2018   L dorsal forearm near anticubital   Past Surgical History:  Procedure Laterality Date   APPENDECTOMY     CATARACT EXTRACTION W/PHACO Left 06/24/2019   Procedure: CATARACT EXTRACTION PHACO AND INTRAOCULAR LENS PLACEMENT (IOC) LEFT;  Surgeon: Mittie Gaskin, MD;  Location: [facility];  Service: Ophthalmology;  Laterality: Left;   COLONOSCOPY  2011   INGUINAL HERNIA REPAIR     left   INGUINAL HERNIA REPAIR  5/12   Dr Jesslyn   INGUINAL HERNIA REPAIR  5/12   Dr Dellie did redo of this   JOINT REPLACEMENT  11/13   Left total knee--Dr Hooten   KNEE SURGERY  2013   POLYPECTOMY  2011   PROSTATECTOMY  2010   TONSILLECTOMY     VARICOSE VEIN SURGERY     left   Family History  Problem Relation Age of Onset   Dementia Mother    Stroke Father    Leukemia Sister    Colon cancer Neg Hx    Social History   Socioeconomic History   Marital status: Married    Spouse name: Not on file   Number of children: 0   Years of education: Not on file   Highest education level: Not on file  Occupational History   Occupation: Retired-purchasing for [employer]  Tobacco Use   Smoking status: Former    Current packs/day: 0.00    Types: Cigarettes    Quit date: 12/04/1983    Years since quitting: 40.6   Smokeless tobacco: Never  Vaping Use   Vaping status: Never Used  Substance and Sexual Activity   Alcohol use: Yes    Alcohol/week: 7.0 standard drinks of alcohol    Types: 7 Glasses of wine per week   Drug use: No   Sexual activity: Not on file  Other Topics Concern   Not on file  Social History Narrative   Has living will   DNR  done 11/12   Wife is health care POA--then niece Erminio Hoehn or niece Darice Donalds   No feeding tube if cognitively unaware   Social Drivers of Health   Financial Resource Strain: Not on file  Food Insecurity: No Food Insecurity (05/06/2024)   Hunger Vital Sign    Worried About Running Out of Food in the Last Year: Never true    Ran Out of Food in the Last Year: Never true  Transportation Needs: No Transportation Needs (05/06/2024)   PRAPARE - Transportation    Lack of  Transportation (Medical): No    Lack of Transportation (Non-Medical): No  Physical Activity: Not on file  Stress: Not on file  Social Connections: Not on file    Tobacco Counseling Counseling given: Not Answered   Clinical Intake:  Pre-visit preparation completed: Yes  Pain : No/denies pain     BMI - recorded: 28 Nutritional Status: BMI 25 -29 Overweight Diabetes: No  How often do you need to have someone help you when you read instructions, pamphlets, or other written materials from your doctor or pharmacy?: 1 - Never         Activities of Daily Living    06/30/2024   10:14 AM  In your present state of health, do you have any difficulty performing the following activities:  Hearing? 1  Vision? 0  Difficulty concentrating or making decisions? 0  Walking or climbing stairs? 1  Dressing or bathing? 1  Comment wife is close by  Doing errands, shopping? 0  Preparing Food and eating ? N  Using the Toilet? Y  Comment due to his knee  In the past six months, have you accidently leaked urine? N  Do you have problems with loss of bowel control? N  Managing your Medications? N  Managing your Finances? N  Housekeeping or managing your Housekeeping? N    Patient Care Team: Abdul Fine, MD as PCP - General (Family Medicine)  Indicate any recent Medical Services you may have received from other than [facility] providers in the past year (date may be approximate).     Assessment:   This is a  routine wellness examination for [place].  Hearing/Vision screen Vision Screening - Comments:: [facility] Last Visit: 06/2024   Goals Addressed   None    Depression Screen    06/30/2024   10:10 AM 05/06/2024    2:05 PM 04/08/2024    3:37 PM 03/13/2024    4:08 PM 08/21/2023    2:52 PM 07/03/2023    1:57 PM 06/25/2023   10:09 AM  PHQ 2/9 Scores  PHQ - 2 Score 0 0 0 0 4 0 0  PHQ- 9 Score     5      Fall Risk    06/30/2024   10:09 AM 05/06/2024    2:04 PM 04/08/2024    3:36 PM 03/13/2024    4:08 PM 07/03/2023    2:14 PM  Fall Risk   Falls in the past year? 1 1 1 1 1   Number falls in past yr: 1 1 1 1 1   Injury with Fall? 1 1 1 1  0  Risk for fall due to : Impaired balance/gait;Impaired mobility Impaired balance/gait;Impaired mobility Impaired mobility;Impaired balance/gait Impaired balance/gait;Impaired mobility History of fall(s);Impaired balance/gait;Impaired mobility  Follow up Falls evaluation completed Falls evaluation completed Falls evaluation completed Falls evaluation completed     MEDICARE RISK AT HOME: Medicare Risk at Home Any stairs in or around the home?: Yes If so, are there any without handrails?: No Home free of loose throw rugs in walkways, pet beds, electrical cords, etc?: Yes Adequate lighting in your home to reduce risk of falls?: Yes Life alert?: Yes Use of a cane, walker or w/c?: Yes Grab bars in the bathroom?: Yes Shower chair or bench in shower?: Yes Elevated toilet seat or a handicapped toilet?: Yes  TIMED UP AND GO:  Was the test performed?  No    Cognitive Function:        06/30/2024   10:10 AM 06/25/2023  10:12 AM  6CIT Screen  What Year? 0 points 0 points  What month? 0 points 0 points  What time? 0 points 0 points  Count back from 20 0 points 0 points  Months in reverse 0 points 0 points  Repeat phrase 0 points 0 points  Total Score 0 points 0 points    Immunizations Immunization History  Administered Date(s) Administered    Hepatitis A, Adult 08/17/2003   Hepatitis B, ADULT 02/15/2004   Influenza Split 09/17/2011, 09/10/2012   Influenza Whole 10/03/2010   Influenza, High Dose Seasonal PF 09/06/2016, 09/18/2022   Influenza, Seasonal, Injecte, Preservative Fre 10/03/2015   Influenza,inj,Quad PF,6+ Mos 08/13/2017   Influenza-Unspecified 09/02/2014, 09/16/2018, 09/15/2019, 09/02/2020, 09/26/2023   Moderna Covid-19 Fall Seasonal Vaccine 44yrs & older 03/12/2023   Moderna Covid-19 Vaccine  Bivalent Booster 41yrs & up 08/24/2021, 05/01/2022, 10/12/2022   Moderna Sars-Covid-2 Vaccination 12/15/2019, 01/12/2020, 02/19/2020, 10/18/2020, 04/18/2021   OPV 08/17/2003   Pneumococcal Conjugate-13 06/09/2014   Pneumococcal Polysaccharide-23 10/30/2005, 05/06/2018   Td 10/24/2010   Tdap 10/06/2022   Typhoid Live 08/17/2003   Unspecified SARS-COV-2 Vaccination 08/30/2023, 03/13/2024   Zoster Recombinant(Shingrix) 07/23/2019, 10/09/2019   Zoster, Live 10/03/2010    TDAP status: Up to date  Flu Vaccine status: Up to date  Pneumococcal vaccine status: Up to date  Covid-19 vaccine status: Information provided on how to obtain vaccines.   Qualifies for Shingles Vaccine? Yes   Zostavax completed No   Shingrix Completed?: Yes  Screening Tests Health Maintenance  Topic Date Due   Hepatitis B Vaccines (2 of 3 - 19+ 3-dose series) 03/14/2004   COVID-19 Vaccine (12 - 2024-25 season) 05/08/2024   INFLUENZA VACCINE  07/03/2024   Medicare Annual Wellness (AWV)  06/30/2025   DTaP/Tdap/Td (3 - Td or Tdap) 10/06/2032   Pneumococcal Vaccine: 50+ Years  Completed   Zoster Vaccines- Shingrix  Completed   HPV VACCINES  Aged Out   Meningococcal B Vaccine  Aged Out    Health Maintenance  Health Maintenance Due  Topic Date Due   Hepatitis B Vaccines (2 of 3 - 19+ 3-dose series) 03/14/2004   COVID-19 Vaccine (12 - 2024-25 season) 05/08/2024    Colorectal cancer screening: No longer required.   Lung Cancer Screening: (Low  Dose CT Chest recommended if Age 19-80 years, 20 pack-year currently smoking OR have quit w/in 15years.) does not qualify.   Lung Cancer Screening Referral: na  Additional Screening:  Hepatitis C Screening: does not qualify  Vision Screening: Recommended annual ophthalmology exasm for early detection of glaucoma and other disorders of the eye. Is the patient up to date with their annual eye exam?  Yes  Who is the provider or what is the name of the office in which the patient attends annual eye exams?  eye  If pt is not established with a provider, would they like to be referred to a provider to establish care? No .   Dental Screening: Recommended annual dental exams for proper oral hygiene   Community Resource Referral / Chronic Care Management: CRR required this visit?  No   CCM required this visit?  No     Plan:     I have personally reviewed and noted the following in the patient's chart:   Medical and social history Use of alcohol, tobacco or illicit drugs  Current medications and supplements including opioid prescriptions. Patient is not currently taking opioid prescriptions. Functional ability and status Nutritional status Physical activity Advanced directives List of other physicians Hospitalizations,  surgeries, and ER visits in previous 12 months Vitals Screenings to include cognitive, depression, and falls Referrals and appointments  In addition, I have reviewed and discussed with patient certain preventive protocols, quality metrics, and best practice recommendations. A written personalized care plan for preventive services as well as general preventive health recommendations were provided to patient.     Harlene MARLA An, NP   06/30/2024

## 2024-07-08 ENCOUNTER — Ambulatory Visit: Admitting: Student

## 2024-07-08 ENCOUNTER — Encounter: Payer: Self-pay | Admitting: Student

## 2024-07-08 VITALS — BP 118/64 | HR 72 | Temp 97.9°F | Ht 66.5 in | Wt 179.0 lb

## 2024-07-08 DIAGNOSIS — R499 Unspecified voice and resonance disorder: Secondary | ICD-10-CM | POA: Diagnosis not present

## 2024-07-08 DIAGNOSIS — G8929 Other chronic pain: Secondary | ICD-10-CM | POA: Diagnosis not present

## 2024-07-08 DIAGNOSIS — F3342 Major depressive disorder, recurrent, in full remission: Secondary | ICD-10-CM | POA: Diagnosis not present

## 2024-07-08 DIAGNOSIS — M25511 Pain in right shoulder: Secondary | ICD-10-CM

## 2024-07-08 DIAGNOSIS — N1831 Chronic kidney disease, stage 3a: Secondary | ICD-10-CM | POA: Diagnosis not present

## 2024-07-08 DIAGNOSIS — F321 Major depressive disorder, single episode, moderate: Secondary | ICD-10-CM

## 2024-07-08 NOTE — Patient Instructions (Signed)
 VISIT SUMMARY:  Today, you had a follow-up visit to discuss your chronic kidney disease, vocal cord weakness, shoulder pain, and depression. We also reviewed your general health maintenance needs.  YOUR PLAN:  -VOCAL CORD WEAKNESS: You have chronic vocal cord weakness, which causes reduced vocal volume and occasional mumbling, especially when you are relaxed. You have chosen not to pursue speech therapy at this time. Your partner's use of a hearing aid may also contribute to communication difficulties.  -SHOULDER PAIN: Your shoulder pain is likely due to arthritis and is worse with certain movements and positions, such as reaching back or sleeping on your shoulder. You should try the shoulder exercises that your partner has used before. If these exercises do not help, we can consider therapy.  -CHRONIC KIDNEY DISEASE STAGE 3: Chronic kidney disease stage 3 means your kidneys are moderately damaged and are not working as well as they should. We need to monitor your kidney function regularly. We will order kidney function labs to be collected at your home next month.  -DEPRESSION: Your depression is currently managed with sertraline  100 mg daily, and your mood is stable on this dose. Occasionally, you feel depressed due to limitations in activities, but you cope by discussing your feelings and trying not to dwell on them. We will update your prescription to reflect the current dose.  -GENERAL HEALTH MAINTENANCE: You are due for a hepatitis B vaccination, which is important for preventing liver inflammation and damage. We recommend getting this vaccine at your local pharmacy.  INSTRUCTIONS:  Please schedule a follow-up appointment in three months to monitor your chronic conditions and adjust treatment as necessary.

## 2024-07-09 ENCOUNTER — Encounter: Payer: Self-pay | Admitting: Student

## 2024-07-09 NOTE — Progress Notes (Signed)
 [facility]: [facility] Provider: ABDUL  Code Status: DNR Goals of Care:     06/30/2024   10:08 AM  Advanced Directives  Does Patient Have a Medical Advance Directive? Yes  Type of Estate agent of [place];Living will;Out of facility DNR (pink MOST or yellow form)  Does patient want to make changes to medical advance directive? No - Patient declined  Copy of Healthcare Power of Attorney in Chart? Yes - validated most recent copy scanned in chart (See row information)     Chief Complaint  Patient presents with   Medical Management of Chronic Issues    Medical Management of Chronic Issues. 2 Month follow up    HPI: Patient is a 89 y.o. male seen today for medical management of chronic diseases.   Discussed the use of AI scribe software for clinical note transcription with the patient, who gave verbal consent to proceed.  History of Present Illness   Juan Horn is an 89 year old male with chronic kidney disease stage three who presents for a follow-up visit.  He has chronic kidney disease stage three. He denies any blood in his urine or stool and reports adequate fluid intake.  He has a history of vocal cord weakness, leading to occasional mumbling, especially when relaxed. Communication difficulties are noted, partly due to his partner's use of a hearing aid.  He experiences shoulder pain, particularly in the morning. The pain affects certain movements, such as putting on a coat, and is more pronounced upon waking.  He has a history of depression and is currently taking sertraline  100 mg daily. He previously took a higher dose but has since reduced it without significant issues. Occasional feelings of depression are related to his inability to perform certain activities. He reports no problems with mood on the current sertraline  dose, though he occasionally feels depressed due to his inability to perform certain  activities.  Safety features, such as a grab bar in the garage, have been installed. A recent incident involved slipping in the shower due to soap on his hands and the grab bar.  He receives regular eye injections for wet and dry conditions, with treatments occurring every other month and every fourteen weeks, respectively.         Past Medical History:  Diagnosis Date   Actinic keratosis    Anxiety    BCC (basal cell carcinoma of skin) 07/24/2021   Left upper forehead, ED[facility]   Cancer (HCC) 2010   Prostate Cancer   Cataract    left eye   Chronic renal disease, stage III (HCC)    Chronic venous insufficiency    Clotting disorder (HCC) 2013   blood clot 3 days post knee surgery   Diverticulosis of colon    DVT (deep venous thrombosis) (HCC) 2012   after knee replacement   Glaucoma    HLD (hyperlipidemia)    Hx of basal cell carcinoma 12/23/2018   L nasal tip   Hx of basal cell carcinoma 12/23/2018   R preauricular   Hx of colonic polyp    Macular degeneration    legally blind in right eye   OA (osteoarthritis)    Personal history of prostate cancer    Pulmonary embolism (HCC) 10/2012   post op TKR   PVC (premature ventricular contraction)    Spinal stenosis of lumbar region    Squamous cell carcinoma of skin 05/06/2018   L dorsal forearm near  anticubital    Past Surgical History:  Procedure Laterality Date   APPENDECTOMY     CATARACT EXTRACTION W/PHACO Left 06/24/2019   Procedure: CATARACT EXTRACTION PHACO AND INTRAOCULAR LENS PLACEMENT (IOC) LEFT;  Surgeon: Mittie Gaskin, MD;  Location: [facility];  Service: Ophthalmology;  Laterality: Left;   COLONOSCOPY  2011   INGUINAL HERNIA REPAIR     left   INGUINAL HERNIA REPAIR  5/12   Dr Jesslyn   INGUINAL HERNIA REPAIR  5/12   Dr Dellie did redo of this   JOINT REPLACEMENT  11/13   Left total knee--[facility]   KNEE SURGERY  2013   POLYPECTOMY  2011   PROSTATECTOMY  2010   TONSILLECTOMY      VARICOSE VEIN SURGERY     left    Allergies  Allergen Reactions   Naphazoline-Polyethyl Glycol Other (See Comments)    (Afgan) redness    Outpatient Encounter Medications as of 07/08/2024  Medication Sig   acetaminophen  (TYLENOL ) 500 MG tablet Take 1,000 mg by mouth daily.   Ascorbic Acid (VITAMIN C PO) Take by mouth daily.   Cholecalciferol  (VITAMIN D3 PO) Take 1,000 Units by mouth daily.   erythromycin ophthalmic ointment Place 1 Application into both eyes at bedtime.   hydrocortisone 2.5 % cream Apply topically 2 (two) times daily as needed.   hydroxypropyl methylcellulose / hypromellose (ISOPTO TEARS / GONIOVISC) 2.5 % ophthalmic solution 1 drop as needed for dry eyes.   latanoprost  (XALATAN ) 0.005 % ophthalmic solution Place 1 drop into both eyes at bedtime.   Multiple Vitamins-Minerals (PRESERVISION AREDS 2 PO) Take 2 tablets by mouth daily.    pyridOXINE (VITAMIN B6) 25 MG tablet Take 25 mg by mouth daily.   sertraline  (ZOLOFT ) 100 MG tablet Take 1 tablet (100 mg total) by mouth daily.   Wound Dressings (MEPILEX) PADS Apply 1 Application topically every 3 (three) days.   No facility-administered encounter medications on file as of 07/08/2024.    Review of Systems:  Review of Systems  Health Maintenance  Topic Date Due   Hepatitis B Vaccines (2 of 3 - 19+ 3-dose series) 03/14/2004   COVID-19 Vaccine (12 - 2024-25 season) 05/08/2024   INFLUENZA VACCINE  07/03/2024   Medicare Annual Wellness (AWV)  06/30/2025   DTaP/Tdap/Td (3 - Td or Tdap) 10/06/2032   Pneumococcal Vaccine: 50+ Years  Completed   Zoster Vaccines- Shingrix  Completed   HPV VACCINES  Aged Out   Meningococcal B Vaccine  Aged Out    Physical Exam: Vitals:   07/08/24 1328  BP: 118/64  Pulse: 72  Temp: 97.9 F (36.6 C)  SpO2: 97%  Weight: 179 lb (81.2 kg)  Height: 5' 6.5 (1.689 m)   Body mass index is 28.46 kg/m. Physical Exam Constitutional:      Appearance: Normal appearance.     Comments: In  scooter  Cardiovascular:     Rate and Rhythm: Normal rate and regular rhythm.     Pulses: Normal pulses.     Heart sounds: Normal heart sounds.  Pulmonary:     Effort: Pulmonary effort is normal.  Abdominal:     General: Abdomen is flat. Bowel sounds are normal.     Palpations: Abdomen is soft.  Musculoskeletal:        General: No swelling or tenderness.  Skin:    General: Skin is warm and dry.  Neurological:     Mental Status: He is alert and oriented to person, place, and time.  Gait: Gait normal.  Psychiatric:        Mood and Affect: Mood normal.    Labs reviewed: Basic Metabolic Panel: Recent Labs    12/30/23 0758 03/16/24 0753 04/09/24 0825  NA 139 141 139  K 4.2 4.0 4.0  CL 102 106 102  CO2 31 30 31   GLUCOSE 85 106* 88  BUN 33* 34* 29*  CREATININE 1.36* 1.28* 1.22  CALCIUM 9.4  9.4 9.1  9.2 9.3  PHOS  --  3.7  --   TSH 2.77  --   --    Liver Function Tests: Recent Labs    12/30/23 0758 03/16/24 0753  AST 17 17  ALT 23 25  BILITOT 0.7 0.4  PROT 7.5 7.5   No results for input(s): LIPASE, AMYLASE in the last 8760 hours. No results for input(s): AMMONIA in the last 8760 hours. CBC: Recent Labs    12/30/23 0758 03/16/24 0753 04/09/24 0825  WBC 7.7 6.7 6.5  NEUTROABS 4,951 3,899 4,238  HGB 14.0 11.9* 12.3*  HCT 44.4 37.7* 38.5  MCV 86.2 86.9 86.1  PLT 196 206 199   Lipid Panel: Recent Labs    12/30/23 0758  CHOL 187  HDL 52  LDLCALC 112*  TRIG 122  CHOLHDL 3.6   No results found for: HGBA1C  Procedures since last visit: No results found. Results          Assessment/Plan     Voice changes Chronic vocal cord weakness resulting in reduced vocal volume and occasional mumbling, particularly in relaxed settings. He declines speech therapy at this time. His partner's use of a hearing aid may contribute to communication difficulties.  Shoulder Pain Chronic shoulder pain, likely due to arthritis, exacerbated by certain  movements and positions such as reaching back or sleeping on the shoulder. He prefers to try exercises previously used by his partner before considering therapy. - Try shoulder exercises previously used by partner - Consider therapy if exercises do not help  Chronic Kidney Disease Stage 3 Chronic kidney disease stage 3, requiring regular monitoring of kidney function to ensure stability. - Order kidney function labs to be collected at home next month  Depression Depression managed with sertraline  100 mg daily. Mood is stable on the current dose, though occasional depressive episodes occur due to limitations in activities. He copes by discussing his feelings and trying not to dwell on them. - Update prescription to reflect current dose of sertraline  100 mg daily  General Health Maintenance Due for hepatitis B vaccination. Discussed the importance of the vaccine in preventing liver inflammation and damage. - Recommend hepatitis B vaccination at local pharmacy  Follow-up Routine follow-up to monitor chronic conditions and adjust treatment as necessary. - Schedule follow-up appointment in three months      Labs/tests ordered:  * No order type specified * Next appt:  10/14/2024

## 2024-07-13 DIAGNOSIS — H353123 Nonexudative age-related macular degeneration, left eye, advanced atrophic without subfoveal involvement: Secondary | ICD-10-CM | POA: Diagnosis not present

## 2024-07-23 DIAGNOSIS — L820 Inflamed seborrheic keratosis: Secondary | ICD-10-CM | POA: Diagnosis not present

## 2024-08-10 DIAGNOSIS — N1831 Chronic kidney disease, stage 3a: Secondary | ICD-10-CM | POA: Diagnosis not present

## 2024-08-11 LAB — COMPLETE METABOLIC PANEL WITHOUT GFR
AG Ratio: 1.3 (calc) (ref 1.0–2.5)
ALT: 36 U/L (ref 9–46)
AST: 21 U/L (ref 10–35)
Albumin: 4 g/dL (ref 3.6–5.1)
Alkaline phosphatase (APISO): 67 U/L (ref 35–144)
BUN/Creatinine Ratio: 27 (calc) — ABNORMAL HIGH (ref 6–22)
BUN: 31 mg/dL — ABNORMAL HIGH (ref 7–25)
CO2: 30 mmol/L (ref 20–32)
Calcium: 9.2 mg/dL (ref 8.6–10.3)
Chloride: 103 mmol/L (ref 98–110)
Creat: 1.15 mg/dL (ref 0.70–1.22)
Globulin: 3.2 g/dL (ref 1.9–3.7)
Glucose, Bld: 105 mg/dL — ABNORMAL HIGH (ref 65–99)
Potassium: 4.1 mmol/L (ref 3.5–5.3)
Sodium: 142 mmol/L (ref 135–146)
Total Bilirubin: 0.5 mg/dL (ref 0.2–1.2)
Total Protein: 7.2 g/dL (ref 6.1–8.1)

## 2024-08-11 LAB — MICROALBUMIN / CREATININE URINE RATIO
Creatinine, Urine: 73 mg/dL (ref 20–320)
Microalb Creat Ratio: 15 mg/g{creat} (ref <30)
Microalb, Ur: 1.1 mg/dL

## 2024-08-11 LAB — VITAMIN D 25 HYDROXY (VIT D DEFICIENCY, FRACTURES): Vit D, 25-Hydroxy: 57 ng/mL (ref 30–100)

## 2024-08-11 LAB — PTH, INTACT AND CALCIUM
Calcium: 9.2 mg/dL (ref 8.6–10.3)
PTH: 29 pg/mL (ref 8.6–77)

## 2024-08-11 LAB — PHOSPHORUS: Phosphorus: 3.5 mg/dL (ref 2.1–4.3)

## 2024-08-12 ENCOUNTER — Telehealth: Payer: Self-pay | Admitting: Pharmacist

## 2024-08-12 ENCOUNTER — Telehealth: Payer: Self-pay

## 2024-08-12 DIAGNOSIS — Z79899 Other long term (current) drug therapy: Secondary | ICD-10-CM

## 2024-08-12 NOTE — Telephone Encounter (Signed)
 I called patients pharmacy to find out about the request for a Hep B vaccine and was told patient came to the pharmacy stating Dr.Beamer told him to go to the pharmacy to get the Hep B Vaccine.   Per the pharmacist his insurance will not cover(possibly due to patients age) and there fore a prior authorization will be needed for patient to proceed with a hep B vaccine, in which we will need to explain why it was recommended that patient get hep B vaccine.

## 2024-08-12 NOTE — Telephone Encounter (Signed)
 Message sent to pcp again as we have several options for hep b vaccines and the provider would have to select the appropriate vaccine for patient and also confirm patient needs hep B vaccine

## 2024-08-12 NOTE — Progress Notes (Signed)
   08/12/2024  Patient ID: Juan Horn, male   DOB: 08-08-1933, 89 y.o.   MRN: [id number]  Received message from CMA regarding [name] difficulty obtaining a hepatitis B vaccine at [facility].  Called Patient. Patient said he attempted to receive [facility] vaccine at [facility] today but was unable to due to insurance issues. He said [facility] staff told him a prescription was required.  Follow-up conversation with [facility]ist clarified that a prior authorization (PA) is required, possibly related to patient's age.  Unable to find a clear age cutoff for Hepatitis B vaccine coverage requiring PA.  Plan:  Will follow up with the office for further discussion regarding next steps (PA submission or alternate administration site). (Awaiting a call back from CMA)  Will update patient once more details are available.   Cassius DOROTHA Brought, PharmD, BCACP Clinical Pharmacist [phone]

## 2024-08-12 NOTE — Telephone Encounter (Signed)
 Copied from CRM [id number]. Topic: Clinical - Request for Lab/Test Order >> Aug 12, 2024  1:00 PM Carrielelia G wrote: Patient has an appt today at 230 pm for a Hep B injection but needs a RX for it or will have to pay 200 dollars out of pocket.  He is on his way, there.   Please advise    [id number] [place] [place] - [address] [place] [address] Phone: [phone] Fax: [phone] Hours: Not open 24 hours

## 2024-08-12 NOTE — Telephone Encounter (Signed)
 Copied from CRM [id number]. Topic: Referral - Prior Authorization Question >> Aug 12, 2024  1:00 PM Merlynn A wrote: Reason for CRM: [facility] called in to request PA for HEP B shot. Please place PA for Patient. Any questions please contact [phone].

## 2024-08-13 ENCOUNTER — Telehealth: Payer: Self-pay

## 2024-08-13 MED ORDER — HEPATITIS B VAC RECOMBINANT 5 MCG/0.5ML IJ SUSY: 1.0000 mL | PREFILLED_SYRINGE | INTRAMUSCULAR | 2 refills | Status: DC

## 2024-08-13 NOTE — Telephone Encounter (Signed)
 Hep B vaccine was denied, see denial letter under media.

## 2024-08-13 NOTE — Telephone Encounter (Signed)
 Incoming fax received from patients pharmacy to initiate a prior authorization for                   .  PA initiated through covermymeds. Key: [id number]  Awaiting reply from the insurance company which will be determined in 48-72 hours.

## 2024-08-13 NOTE — Telephone Encounter (Signed)
 I sent a RX for Hep B to patients pharmacy in hopes of moving the process along. IF a prior auth populates back I will need more input from Abdul Fine, MD on the necessity of vaccine.

## 2024-08-18 ENCOUNTER — Ambulatory Visit: Payer: Self-pay | Admitting: Student

## 2024-09-08 ENCOUNTER — Other Ambulatory Visit: Payer: Self-pay

## 2024-09-08 ENCOUNTER — Emergency Department

## 2024-09-08 ENCOUNTER — Emergency Department
Admission: EM | Admit: 2024-09-08 | Discharge: 2024-09-08 | Disposition: A | Discharge status: Home / Self Care | Attending: Emergency Medicine | Admitting: Emergency Medicine

## 2024-09-08 DIAGNOSIS — I6529 Occlusion and stenosis of unspecified carotid artery: Secondary | ICD-10-CM | POA: Diagnosis not present

## 2024-09-08 DIAGNOSIS — W01198A Fall on same level from slipping, tripping and stumbling with subsequent striking against other object, initial encounter: Secondary | ICD-10-CM | POA: Diagnosis not present

## 2024-09-08 DIAGNOSIS — N1831 Chronic kidney disease, stage 3a: Secondary | ICD-10-CM | POA: Diagnosis not present

## 2024-09-08 DIAGNOSIS — Y9301 Activity, walking, marching and hiking: Secondary | ICD-10-CM | POA: Insufficient documentation

## 2024-09-08 DIAGNOSIS — H353123 Nonexudative age-related macular degeneration, left eye, advanced atrophic without subfoveal involvement: Secondary | ICD-10-CM | POA: Diagnosis not present

## 2024-09-08 DIAGNOSIS — M5030 Other cervical disc degeneration, unspecified cervical region: Secondary | ICD-10-CM | POA: Diagnosis not present

## 2024-09-08 DIAGNOSIS — S0990XA Unspecified injury of head, initial encounter: Secondary | ICD-10-CM | POA: Insufficient documentation

## 2024-09-08 DIAGNOSIS — S199XXA Unspecified injury of neck, initial encounter: Secondary | ICD-10-CM | POA: Diagnosis not present

## 2024-09-08 DIAGNOSIS — I672 Cerebral atherosclerosis: Secondary | ICD-10-CM | POA: Diagnosis not present

## 2024-09-08 DIAGNOSIS — M47812 Spondylosis without myelopathy or radiculopathy, cervical region: Secondary | ICD-10-CM | POA: Diagnosis not present

## 2024-09-08 DIAGNOSIS — W19XXXA Unspecified fall, initial encounter: Secondary | ICD-10-CM

## 2024-09-08 DIAGNOSIS — S50811A Abrasion of right forearm, initial encounter: Secondary | ICD-10-CM | POA: Diagnosis not present

## 2024-09-08 NOTE — ED Provider Notes (Signed)
 [facility] Provider Note    Event Date/Time   First MD Initiated Contact with Patient 09/08/24 1209     (approximate)   History   Fall   HPI  Juan Horn is a 89 y.o. male who presents today for evaluation of fall.  Patient reports that he was walking and lost his balance on uneven ground and hit his head.  He denies LOC.  He has been able to ambulate since the event.  He has a small skin tear to his left arm which was bandaged at the scene.  He reports that his tetanus is up-to-date.  No neck pain or paresthesias.  Patient is asking to be discharged.  Patient Active Problem List   Diagnosis Date Noted   Mild cognitive impairment 10/03/2023   MDD (major depressive disorder), single episode, moderate (HCC) 07/04/2023   Claudication 07/04/2023   Pressure injury of right upper back, stage 1 07/04/2023   Pressure injury of right hip, stage 1 07/04/2023   Decreased shoulder mobility, left 07/04/2023   Abnormal gait due to muscle weakness 12/24/2022   Senile ectropion of both lower eyelids 08/22/2021   Exposure keratitis 08/22/2021   Dermatochalasis of both upper eyelids 08/22/2021   Acquired ptosis of both eyelids 08/22/2021   Pain due to onychomycosis of toenails of both feet 10/31/2020   Gastroesophageal reflux disease 10/15/2018   Stage 3a chronic kidney disease (HCC) 05/06/2018   Hearing loss 08/13/2017   MDD (major depressive disorder), recurrent, in full remission 01/22/2017   History of DVT (deep vein thrombosis) 01/22/2017   Symptomatic bradycardia 01/02/2017   Spinal stenosis, lumbar region, with neurogenic claudication 10/29/2016   DDD (degenerative disc disease), lumbar 12/20/2015   Advance directive discussed with patient 04/12/2015   Personal history of prostate cancer    Routine general medical examination at a health care facility 04/02/2013   Chronic venous insufficiency    Osteoarthrosis of knee 03/23/2011   Glaucoma     Age-related macular degeneration, wet, left eye (HCC)    Hyperlipemia 10/24/2010   Diverticulosis of colon 10/24/2010          Physical Exam   Triage Vital Signs: ED Triage Vitals  Encounter Vitals Group     BP 09/08/24 1129 (!) 117/54     Girls Systolic BP Percentile --      Girls Diastolic BP Percentile --      Boys Systolic BP Percentile --      Boys Diastolic BP Percentile --      Pulse Rate 09/08/24 1129 65     Resp 09/08/24 1129 18     Temp 09/08/24 1129 98.1 F (36.7 C)     Temp Source 09/08/24 1129 Oral     SpO2 09/08/24 1129 95 %     Weight 09/08/24 1127 180 lb (81.6 kg)     Height 09/08/24 1127 5' 7 (1.702 m)     Head Circumference --      Peak Flow --      Pain Score 09/08/24 1127 0     Pain Loc --      Pain Education --      Exclude from Growth Chart --     Most recent vital signs: Vitals:   09/08/24 1129  BP: (!) 117/54  Pulse: 65  Resp: 18  Temp: 98.1 F (36.7 C)  SpO2: 95%    Physical Exam Vitals and nursing note reviewed.  Constitutional:      General:  Awake and alert. No acute distress.    Appearance: Normal appearance. The patient is normal weight.  HENT:     Head: Normocephalic and atraumatic.     Mouth: Mucous membranes are moist.  Eyes:     General: PERRL. Normal EOMs        Right eye: No discharge.        Left eye: No discharge.     Conjunctiva/sclera: Conjunctivae normal.  Cardiovascular:     Rate and Rhythm: Normal rate and regular rhythm.     Pulses: Normal pulses.  Pulmonary:     Effort: Pulmonary effort is normal. No respiratory distress.     Breath sounds: Normal breath sounds.  Abdominal:     Abdomen is soft. There is no abdominal tenderness. No rebound or guarding. No distention. Musculoskeletal:        General: No swelling. Normal range of motion.     Cervical back: Normal range of motion and neck supple. No midline cervical spine tenderness.  Full range of motion of neck.  Negative Spurling test.  Negative Lhermitte  sign.  Normal strength and sensation in bilateral upper extremities. Normal grip strength bilaterally.  Normal intrinsic muscle function of the hand bilaterally.  Normal radial pulses bilaterally. Skin:    General: Skin is warm and dry.     Capillary Refill: Capillary refill takes less than 2 seconds.     Findings: 2 cm abrasion noted to right proximal forearm, no hematoma.  No bony tenderness. Neurological:     Mental Status: The patient is awake and alert.   Neurological: GCS 15 alert and oriented x3 Normal speech, no expressive or receptive aphasia or dysarthria Cranial nerves II through XII intact Normal visual fields 5 out of 5 strength in all 4 extremities with intact sensation throughout No extremity drift Normal finger-to-nose testing, no limb or truncal ataxia    ED Results / Procedures / Treatments   Labs (all labs ordered are listed, but only abnormal results are displayed) Labs Reviewed - No data to display   EKG     RADIOLOGY I independently reviewed and interpreted imaging and agree with radiologists findings.     PROCEDURES:  Critical Care performed:   Procedures   MEDICATIONS ORDERED IN ED: Medications - No data to display   IMPRESSION / MDM / ASSESSMENT AND PLAN / ED COURSE  I reviewed the triage vital signs and the nursing notes.   Differential diagnosis includes, but is not limited to, contusion, abrasion, skin tear, concussion, intracranial hemorrhage  Patient is awake and alert, hemodynamically stable and neurovascularly intact.  He has no bony tenderness.  CT head and neck obtained per [place] criteria.  Abrasion was cleaned and bandaged.  His tetanus is up-to-date.  Patient is requesting to be discharged prior to CT results return.  We discussed that this is AGAINST MEDICAL ADVICE to leave as we may be missing a severe or dangerous diagnosis that could result in severe disability or mortality.  Patient reports that he understands and  agrees, but still like to be discharged.  His wife is in agreement.  CT head and neck obtained after patient left the department, negative for any acute findings.  Patient's presentation is most consistent with acute complicated illness / injury requiring diagnostic workup.   Clinical Course as of 09/08/24 1453  Tue Sep 08, 2024  1355 Patient and wife reports that they need to leave because he has an appointment with his ophthalmologist.  They do not  wish to wait for the results.  They understand the risks of leaving without these results including missed or delayed diagnosis resulting in severe morbidity or mortality [JP]    Clinical Course User Index [JP] Kevia Zaucha E, PA-C     FINAL CLINICAL IMPRESSION(S) / ED DIAGNOSES   Final diagnoses:  Fall, initial encounter  Injury of head, initial encounter     Rx / DC Orders   ED Discharge Orders     None        Note:  This document was prepared using Dragon voice recognition software and may include unintentional dictation errors.   Jaianna Nicoll E, PA-C 09/08/24 1453    Arlander Charleston, MD 09/08/24 [phone]

## 2024-09-08 NOTE — Discharge Instructions (Signed)
 As we discussed, I do not yet have the results of your CT scans.  Therefore we could be missing a dangerous diagnosis if you choose to leave before they result.  Please return for any new, worsening, or change in symptoms or other concerns.  Please follow-up with your outpatient provider.

## 2024-09-08 NOTE — ED Notes (Signed)
 See triage note  Presents s/p fall  States fell backwards  hitting head on car wheel  Skin tear noted on left arm

## 2024-09-08 NOTE — ED Triage Notes (Signed)
 Patient states he fell backwards PTA; hit head on car wheel and has a skin tear to left forearm. No LOC, does not take blood thinners.

## 2024-09-14 DIAGNOSIS — H353123 Nonexudative age-related macular degeneration, left eye, advanced atrophic without subfoveal involvement: Secondary | ICD-10-CM | POA: Diagnosis not present

## 2024-09-16 DIAGNOSIS — B351 Tinea unguium: Secondary | ICD-10-CM | POA: Diagnosis not present

## 2024-09-16 DIAGNOSIS — I7091 Generalized atherosclerosis: Secondary | ICD-10-CM | POA: Diagnosis not present

## 2024-09-17 DIAGNOSIS — L820 Inflamed seborrheic keratosis: Secondary | ICD-10-CM | POA: Diagnosis not present

## 2024-09-17 DIAGNOSIS — B353 Tinea pedis: Secondary | ICD-10-CM | POA: Diagnosis not present

## 2024-09-25 DIAGNOSIS — Z23 Encounter for immunization: Secondary | ICD-10-CM | POA: Diagnosis not present

## 2024-09-29 ENCOUNTER — Other Ambulatory Visit: Payer: Self-pay

## 2024-09-29 MED ORDER — PRAVASTATIN SODIUM 20 MG PO TABS: 20.0000 mg | ORAL_TABLET | Freq: Every day | ORAL | 3 refills | Status: AC

## 2024-09-29 NOTE — Telephone Encounter (Signed)
 Received a fax from [facility] regarding pended medication to be filled. Medication was ordered by Abdul Fine, MD. Medication was reconciled so sending to Gil No NP regarding approval

## 2024-10-14 ENCOUNTER — Non-Acute Institutional Stay: Payer: Self-pay | Admitting: Orthopedic Surgery

## 2024-10-14 ENCOUNTER — Encounter: Payer: Self-pay | Admitting: Orthopedic Surgery

## 2024-10-14 VITALS — BP 122/62 | HR 63 | Temp 97.2°F | Ht 67.0 in | Wt 180.2 lb

## 2024-10-14 DIAGNOSIS — M25561 Pain in right knee: Secondary | ICD-10-CM

## 2024-10-14 DIAGNOSIS — E785 Hyperlipidemia, unspecified: Secondary | ICD-10-CM | POA: Diagnosis not present

## 2024-10-14 DIAGNOSIS — H353220 Exudative age-related macular degeneration, left eye, stage unspecified: Secondary | ICD-10-CM | POA: Diagnosis not present

## 2024-10-14 DIAGNOSIS — F339 Major depressive disorder, recurrent, unspecified: Secondary | ICD-10-CM | POA: Diagnosis not present

## 2024-10-14 DIAGNOSIS — N1831 Chronic kidney disease, stage 3a: Secondary | ICD-10-CM | POA: Diagnosis not present

## 2024-10-14 DIAGNOSIS — Z86718 Personal history of other venous thrombosis and embolism: Secondary | ICD-10-CM | POA: Diagnosis not present

## 2024-10-14 DIAGNOSIS — R2681 Unsteadiness on feet: Secondary | ICD-10-CM | POA: Diagnosis not present

## 2024-10-14 DIAGNOSIS — G8929 Other chronic pain: Secondary | ICD-10-CM

## 2024-10-14 NOTE — Patient Instructions (Signed)
 Recommend trying Claritin or Zyrtec to dry nasal secretions> take 1 tablet every night> try for at least 14 days   Fasting lab day 04/01/2025 at 7:30 am> they will come to your apartment , do not eat 12 hours before blood is drawn> black water and coffee ok   Please put lotion or vaseline on skin after bathing

## 2024-10-14 NOTE — Progress Notes (Signed)
 Location:  Other Nursing Home Room Number: [facility] of Service:  Clinic ([id number]) Provider:  Greig FORBES Cluster, NP   Cluster Greig FORBES, NP  Patient Care Team: Cluster Greig FORBES, NP as PCP - General (Adult Health Nurse Practitioner)  Extended Emergency Contact Information Primary Emergency Contact: Hawthorn,Brenda Mobile Phone: [phone] Relation: Niece Secondary Emergency Contact: Bessent,Carole Address: [address] [place], [place] [address] [place] Home Phone: [phone] Mobile Phone: [phone] Relation: Spouse  Code Status:  Full code  Goals of care: Advanced Directive information    09/08/2024   11:28 AM  Advanced Directives  Does Patient Have a Medical Advance Directive? Yes  Type of Advance Directive Living will;Healthcare Power of Attorney  Does patient want to make changes to medical advance directive? No - Patient declined  Copy of Healthcare Power of Attorney in Chart? No - copy requested     Chief Complaint  Patient presents with   Medical Management of Chronic Issues    HPI:  Pt is a 89 y.o. male seen today for medical management of chronic diseases.    Discussed the use of AI scribe software for clinical note transcription with the patient, who gave verbal consent to proceed.  History of Present Illness   The patient is an 89 year old who presents for a three-month follow-up after a fall.  He experienced a fall on October 7th, falling backwards and hitting his head on a car hubcap. He went to ED for evaluation. CT scan of his head and upper spine were negative for bleeding or fracture. No current head or neck pain is reported, and he has not experienced any further falls since then. He uses a power wheelchair for mobility and is extremely cautious to prevent falls.  He has a history of macular degeneration for which he receives injections every fourteen weeks. No recent accidents due to his vision are reported, but he  has stopped driving after some frightening experiences.   He experiences knee pain in his right knee, which had surgery 12-15 years ago. The knee is described as 'bone on bone' and pain occurs with pressure or certain movements. He is cautious with his movements to avoid pain. He does not take pain reliever on daily basis.   He reports a history of neuropathy in his legs, describing difficulty moving them and a lack of sensation at times. He does not wear socks and sometimes feels cold. He has not been formally diagnosed with neuropathy.  He takes pravastatin  in the morning for elevated cholesterol.   No changes in mood. Remains on Zoloft .   He has been told his hemoglobin was mildly low on recent labs. Denies sign of bleeding.   A stable weight and good appetite are reported, with a preference for sweets. He drinks a protein shake with 30 grams of protein for breakfast.  He has a history of hearing loss in the left ear and uses hearing aids, though not regularly. He sees a dentist regularly and has had some skin treatments in the past, including spots removed from his nose and head.       Past Medical History:  Diagnosis Date   Actinic keratosis    Anxiety    BCC (basal cell carcinoma of skin) 07/24/2021   Left upper forehead, EDC   Cancer (HCC) 2010   Prostate Cancer   Cataract    left eye   Chronic renal disease, stage  [facility])    Chronic venous insufficiency    Clotting disorder 2013   blood clot 3 days post knee surgery   Diverticulosis of colon    DVT (deep venous thrombosis) (HCC) 2012   after knee replacement   Glaucoma    HLD (hyperlipidemia)    Hx of basal cell carcinoma 12/23/2018   L nasal tip   Hx of basal cell carcinoma 12/23/2018   R preauricular   Hx of colonic polyp    Macular degeneration    legally blind in right eye   OA (osteoarthritis)    Personal history of prostate cancer    Pulmonary embolism (HCC) 10/2012   post op TKR   PVC (premature  ventricular contraction)    Spinal stenosis of lumbar region    Squamous cell carcinoma of skin 05/06/2018   L dorsal forearm near anticubital   Past Surgical History:  Procedure Laterality Date   APPENDECTOMY     CATARACT EXTRACTION W/PHACO Left 06/24/2019   Procedure: CATARACT EXTRACTION PHACO AND INTRAOCULAR LENS PLACEMENT (IOC) LEFT;  Surgeon: Mittie Gaskin, MD;  Location: [facility];  Service: Ophthalmology;  Laterality: Left;   COLONOSCOPY  2011   INGUINAL HERNIA REPAIR     left   INGUINAL HERNIA REPAIR  5/12   Dr Jesslyn   INGUINAL HERNIA REPAIR  5/12   Dr Dellie did redo of this   JOINT REPLACEMENT  11/13   Left total knee--Dr [facility]   KNEE SURGERY  2013   POLYPECTOMY  2011   PROSTATECTOMY  2010   TONSILLECTOMY     VARICOSE VEIN SURGERY     left    Allergies  Allergen Reactions   Naphazoline-Polyethyl Glycol Other (See Comments)    (Afgan) redness    Outpatient Encounter Medications as of 10/14/2024  Medication Sig   acetaminophen  (TYLENOL ) 500 MG tablet Take 1,000 mg by mouth daily.   Ascorbic Acid (VITAMIN C PO) Take by mouth daily.   Cholecalciferol  (VITAMIN D3 PO) Take 1,000 Units by mouth daily.   erythromycin ophthalmic ointment Place 1 Application into both eyes at bedtime.   hepatitis B vacrRecombinant (RECOMBIVAX HB) 5 MCG/0.5ML SUSY injection Inject 1 mL into the muscle as directed.   hydroxypropyl methylcellulose / hypromellose (ISOPTO TEARS / GONIOVISC) 2.5 % ophthalmic solution 1 drop as needed for dry eyes.   latanoprost  (XALATAN ) 0.005 % ophthalmic solution Place 1 drop into both eyes at bedtime.   Multiple Vitamins-Minerals (PRESERVISION AREDS 2 PO) Take 2 tablets by mouth daily.    neomycin-polymyxin b -dexamethasone  (MAXITROL) 3.5-10000-0.1 SUSP Place 1 drop into the left eye 3 (three) times daily.   pravastatin  (PRAVACHOL ) 20 MG tablet Take 1 tablet (20 mg total) by mouth daily.   sertraline  (ZOLOFT ) 100 MG tablet Take 1  tablet (100 mg total) by mouth daily.   Wound Dressings (MEPILEX) PADS Apply 1 Application topically every 3 (three) days.   hydrocortisone 2.5 % cream Apply topically 2 (two) times daily as needed. (Patient not taking: Reported on 10/14/2024)   pyridOXINE (VITAMIN B6) 25 MG tablet Take 25 mg by mouth daily. (Patient not taking: Reported on 10/14/2024)   No facility-administered encounter medications on file as of 10/14/2024.    Review of Systems  Constitutional: Negative.   HENT:  Positive for hearing loss and rhinorrhea.   Respiratory: Negative.    Cardiovascular: Negative.   Gastrointestinal: Negative.   Genitourinary:  Negative for dysuria and hematuria.  Musculoskeletal:  Positive for arthralgias and gait problem.  Skin:  Negative for wound.  Neurological:  Positive for weakness. Negative for dizziness and light-headedness.  Psychiatric/Behavioral:  Positive for dysphoric mood. Negative for confusion and sleep disturbance. The patient is not nervous/anxious.     Immunization History  Administered Date(s) Administered   Hepatitis A, Adult 08/17/2003   Hepatitis B, ADULT 02/15/2004   INFLUENZA, HIGH DOSE SEASONAL PF 09/06/2016, 09/18/2022, 09/25/2024   Influenza Split 09/17/2011, 09/10/2012   Influenza Whole 10/03/2010   Influenza, Seasonal, Injecte, Preservative Fre 10/03/2015   Influenza,inj,Quad PF,6+ Mos 08/13/2017   Influenza-Unspecified 09/02/2014, 09/16/2018, 09/15/2019, 09/02/2020, 09/26/2023   Moderna Covid-19 Fall Seasonal Vaccine 76yrs & older 03/12/2023   Moderna Covid-19 Vaccine  Bivalent Booster 64yrs & up 08/24/2021, 05/01/2022, 10/12/2022   Moderna Sars-Covid-2 Vaccination 12/15/2019, 01/12/2020, 02/19/2020, 10/18/2020, 04/18/2021   OPV 08/17/2003   Pneumococcal Conjugate-13 06/09/2014   Pneumococcal Polysaccharide-23 10/30/2005, 05/06/2018   Td 10/24/2010   Tdap 10/06/2022   Typhoid Live 08/17/2003   Unspecified SARS-COV-2 Vaccination 08/30/2023, 03/13/2024,  09/25/2024   Zoster Recombinant(Shingrix) 07/23/2019, 10/09/2019   Zoster, Live 10/03/2010   Pertinent  Health Maintenance Due  Topic Date Due   Influenza Vaccine  Completed      04/08/2024    3:36 PM 05/06/2024    2:04 PM 06/30/2024   10:09 AM 07/08/2024    1:28 PM 10/14/2024    1:20 PM  Fall Risk  Falls in the past year? 1 1 1 1 1   Was there an injury with Fall? 1 1 1 1 1   Fall Risk Category Calculator 3 3 3 3 3   Patient at Risk for Falls Due to Impaired mobility;Impaired balance/gait Impaired balance/gait;Impaired mobility Impaired balance/gait;Impaired mobility Impaired balance/gait;Impaired mobility History of fall(s)  Fall risk Follow up Falls evaluation completed Falls evaluation completed Falls evaluation completed Falls evaluation completed Falls evaluation completed   Functional Status Survey:    Vitals:   10/14/24 1312  BP: 122/62  Pulse: 63  Temp: (!) 97.2 F (36.2 C)  TempSrc: Temporal  SpO2: 99%  Weight: 180 lb 3.2 oz (81.7 kg)  Height: 5' 7 (1.702 m)   Body mass index is 28.22 kg/m. Physical Exam Vitals reviewed.  Constitutional:      General: He is not in acute distress.    Appearance: He is not ill-appearing.  HENT:     Head: Normocephalic.  Eyes:     General:        Right eye: No discharge.        Left eye: No discharge.  Neck:     Thyroid : No thyroid  mass or thyromegaly.  Cardiovascular:     Rate and Rhythm: Normal rate and regular rhythm.     Pulses: Normal pulses.     Heart sounds: Normal heart sounds.  Pulmonary:     Effort: Pulmonary effort is normal.     Breath sounds: Normal breath sounds.  Abdominal:     General: Bowel sounds are normal. There is no distension.     Palpations: Abdomen is soft.     Tenderness: There is no abdominal tenderness.  Musculoskeletal:     Right lower leg: No edema.     Left lower leg: No edema.  Skin:    General: Skin is dry.     Capillary Refill: Capillary refill takes less than 2 seconds.     Findings:  No lesion or rash.  Neurological:     General: No focal deficit present.     Mental Status: He is alert and oriented to person, place,  and time.     Gait: Gait abnormal.  Psychiatric:        Mood and Affect: Mood normal.     Labs reviewed: Recent Labs    03/16/24 0753 04/09/24 0825 08/10/24 0808  NA 141 139 142  K 4.0 4.0 4.1  CL 106 102 103  CO2 30 31 30   GLUCOSE 106* 88 105*  BUN 34* 29* 31*  CREATININE 1.28* 1.22 1.15  CALCIUM 9.1  9.2 9.3 9.2  9.2  PHOS 3.7  --  3.5   Recent Labs    12/30/23 0758 03/16/24 0753 08/10/24 0808  AST 17 17 21   ALT 23 25 36  BILITOT 0.7 0.4 0.5  PROT 7.5 7.5 7.2   Recent Labs    12/30/23 0758 03/16/24 0753 04/09/24 0825  WBC 7.7 6.7 6.5  NEUTROABS 4,951 3,899 4,238  HGB 14.0 11.9* 12.3*  HCT 44.4 37.7* 38.5  MCV 86.2 86.9 86.1  PLT 196 206 199   Lab Results  Component Value Date   TSH 2.77 12/30/2023   No results found for: HGBA1C Lab Results  Component Value Date   CHOL 187 12/30/2023   HDL 52 12/30/2023   LDLCALC 112 (H) 12/30/2023   LDLDIRECT 113.0 05/29/2022   TRIG 122 12/30/2023   CHOLHDL 3.6 12/30/2023    Significant Diagnostic Results in last 30 days:  No results found.  Assessment/Plan 1. Recurrent depression (Primary) - no changes in mood - associated with decline in health - Na+ 142 08/2024 - cont Zoloft  - CBC with Differential/Platelet - TSH  2. Stage 3a chronic kidney disease (HCC) - no recent GFR - GFR 43 09/2022 - encourage hydration - avoid frequent NSAID use - Complete Metabolic Panel with eGFR  3. Unstable gait - ambulates with PWC - fall 10/07> ED evaluation> no fracture or SDH  4. Chronic pain of right knee - ongoing - h/o left knee arthroplasty 2013 - pain intermittent - cont tylenol  prn  5. History of blood clots - s/p knee surgery 2013 - not on DOAC  6. Exudative age-related macular degeneration of left eye, unspecified stage (HCC) - followed by ophthalmology -  stopped driving   7. Hyperlipidemia, unspecified hyperlipidemia type - cont pravastatin  - Lipid Panel    Family/ staff Communication: plan discussed with patient and nurse  Labs/tests ordered:  cbc/diff, cmp, TSH, lipid panel 04/01/2025

## 2024-10-15 ENCOUNTER — Other Ambulatory Visit: Payer: Self-pay

## 2024-10-15 DIAGNOSIS — F3342 Major depressive disorder, recurrent, in full remission: Secondary | ICD-10-CM

## 2024-10-15 MED ORDER — SERTRALINE HCL 100 MG PO TABS: 100.0000 mg | ORAL_TABLET | Freq: Every day | ORAL | 3 refills | Status: AC

## 2024-10-22 DIAGNOSIS — B353 Tinea pedis: Secondary | ICD-10-CM | POA: Diagnosis not present

## 2024-10-22 DIAGNOSIS — L820 Inflamed seborrheic keratosis: Secondary | ICD-10-CM | POA: Diagnosis not present

## 2024-11-05 DIAGNOSIS — H353123 Nonexudative age-related macular degeneration, left eye, advanced atrophic without subfoveal involvement: Secondary | ICD-10-CM | POA: Diagnosis not present

## 2024-11-12 ENCOUNTER — Emergency Department

## 2024-11-12 ENCOUNTER — Other Ambulatory Visit: Payer: Self-pay

## 2024-11-12 ENCOUNTER — Observation Stay
Admission: EM | Admit: 2024-11-12 | Discharge: 2024-11-13 | Disposition: A | Discharge status: Skilled Nursing Facility | Attending: Student | Admitting: Student

## 2024-11-12 DIAGNOSIS — E663 Overweight: Secondary | ICD-10-CM | POA: Diagnosis not present

## 2024-11-12 DIAGNOSIS — Z87891 Personal history of nicotine dependence: Secondary | ICD-10-CM | POA: Insufficient documentation

## 2024-11-12 DIAGNOSIS — Y92009 Unspecified place in unspecified non-institutional (private) residence as the place of occurrence of the external cause: Secondary | ICD-10-CM | POA: Diagnosis not present

## 2024-11-12 DIAGNOSIS — N1831 Chronic kidney disease, stage 3a: Secondary | ICD-10-CM | POA: Diagnosis not present

## 2024-11-12 DIAGNOSIS — S0990XA Unspecified injury of head, initial encounter: Secondary | ICD-10-CM | POA: Diagnosis not present

## 2024-11-12 DIAGNOSIS — W19XXXA Unspecified fall, initial encounter: Secondary | ICD-10-CM | POA: Diagnosis not present

## 2024-11-12 DIAGNOSIS — R531 Weakness: Secondary | ICD-10-CM

## 2024-11-12 DIAGNOSIS — J101 Influenza due to other identified influenza virus with other respiratory manifestations: Principal | ICD-10-CM | POA: Diagnosis present

## 2024-11-12 DIAGNOSIS — F418 Other specified anxiety disorders: Secondary | ICD-10-CM | POA: Diagnosis present

## 2024-11-12 DIAGNOSIS — Z6828 Body mass index (BMI) 28.0-28.9, adult: Secondary | ICD-10-CM | POA: Insufficient documentation

## 2024-11-12 DIAGNOSIS — I672 Cerebral atherosclerosis: Secondary | ICD-10-CM | POA: Diagnosis not present

## 2024-11-12 DIAGNOSIS — F32A Depression, unspecified: Secondary | ICD-10-CM | POA: Diagnosis not present

## 2024-11-12 DIAGNOSIS — E876 Hypokalemia: Secondary | ICD-10-CM | POA: Insufficient documentation

## 2024-11-12 DIAGNOSIS — I5032 Chronic diastolic (congestive) heart failure: Secondary | ICD-10-CM | POA: Diagnosis not present

## 2024-11-12 DIAGNOSIS — R296 Repeated falls: Secondary | ICD-10-CM | POA: Diagnosis not present

## 2024-11-12 DIAGNOSIS — R9389 Abnormal findings on diagnostic imaging of other specified body structures: Secondary | ICD-10-CM | POA: Diagnosis not present

## 2024-11-12 DIAGNOSIS — R059 Cough, unspecified: Secondary | ICD-10-CM | POA: Diagnosis not present

## 2024-11-12 DIAGNOSIS — F419 Anxiety disorder, unspecified: Secondary | ICD-10-CM | POA: Insufficient documentation

## 2024-11-12 DIAGNOSIS — E785 Hyperlipidemia, unspecified: Secondary | ICD-10-CM | POA: Diagnosis present

## 2024-11-12 DIAGNOSIS — F3342 Major depressive disorder, recurrent, in full remission: Secondary | ICD-10-CM

## 2024-11-12 DIAGNOSIS — J984 Other disorders of lung: Secondary | ICD-10-CM | POA: Diagnosis not present

## 2024-11-12 DIAGNOSIS — S199XXA Unspecified injury of neck, initial encounter: Secondary | ICD-10-CM | POA: Diagnosis not present

## 2024-11-12 DIAGNOSIS — I6782 Cerebral ischemia: Secondary | ICD-10-CM | POA: Diagnosis not present

## 2024-11-12 DIAGNOSIS — M47812 Spondylosis without myelopathy or radiculopathy, cervical region: Secondary | ICD-10-CM | POA: Diagnosis not present

## 2024-11-12 LAB — URINALYSIS, ROUTINE W REFLEX MICROSCOPIC
Bilirubin Urine: NEGATIVE
Glucose, UA: NEGATIVE mg/dL
Hgb urine dipstick: NEGATIVE
Ketones, ur: 5 mg/dL — AB
Leukocytes,Ua: NEGATIVE
Nitrite: NEGATIVE
Protein, ur: 30 mg/dL — AB
Specific Gravity, Urine: 1.021 (ref 1.005–1.030)
pH: 5 (ref 5.0–8.0)

## 2024-11-12 LAB — RESP PANEL BY RT-PCR (RSV, FLU A&B, COVID)  RVPGX2
Influenza A by PCR: POSITIVE — AB
Influenza B by PCR: NEGATIVE
Resp Syncytial Virus by PCR: NEGATIVE
SARS Coronavirus 2 by RT PCR: NEGATIVE

## 2024-11-12 LAB — COMPREHENSIVE METABOLIC PANEL WITH GFR
ALT: 38 U/L (ref 0–44)
AST: 59 U/L — ABNORMAL HIGH (ref 15–41)
Albumin: 4.2 g/dL (ref 3.5–5.0)
Alkaline Phosphatase: 82 U/L (ref 38–126)
Anion gap: 14 (ref 5–15)
BUN: 32 mg/dL — ABNORMAL HIGH (ref 8–23)
CO2: 25 mmol/L (ref 22–32)
Calcium: 9.7 mg/dL (ref 8.9–10.3)
Chloride: 99 mmol/L (ref 98–111)
Creatinine, Ser: 1.38 mg/dL — ABNORMAL HIGH (ref 0.61–1.24)
GFR, Estimated: 49 mL/min — ABNORMAL LOW (ref >60)
Glucose, Bld: 105 mg/dL — ABNORMAL HIGH (ref 70–99)
Potassium: 3.8 mmol/L (ref 3.5–5.1)
Sodium: 137 mmol/L (ref 135–145)
Total Bilirubin: 0.7 mg/dL (ref 0.0–1.2)
Total Protein: 8.1 g/dL (ref 6.5–8.1)

## 2024-11-12 LAB — CBC
HCT: 45.8 % (ref 39.0–52.0)
Hemoglobin: 14.9 g/dL (ref 13.0–17.0)
MCH: 31.4 pg (ref 26.0–34.0)
MCHC: 32.5 g/dL (ref 30.0–36.0)
MCV: 96.4 fL (ref 80.0–100.0)
Platelets: 136 K/uL — ABNORMAL LOW (ref 150–400)
RBC: 4.75 MIL/uL (ref 4.22–5.81)
RDW: 13.1 % (ref 11.5–15.5)
WBC: 8.2 K/uL (ref 4.0–10.5)
nRBC: 0 % (ref 0.0–0.2)

## 2024-11-12 LAB — PRO BRAIN NATRIURETIC PEPTIDE: Pro Brain Natriuretic Peptide: 524 pg/mL — ABNORMAL HIGH (ref <300.0)

## 2024-11-12 MED ORDER — PRAVASTATIN SODIUM 20 MG PO TABS
20.0000 mg | ORAL_TABLET | Freq: Every day | ORAL | Status: DC
  Administered 2024-11-12: 20 mg via ORAL
  Filled 2024-11-12: qty 1

## 2024-11-12 MED ORDER — ERYTHROMYCIN 5 MG/GM OP OINT
1.0000 | TOPICAL_OINTMENT | Freq: Every day | OPHTHALMIC | Status: DC
  Administered 2024-11-12: 1 via OPHTHALMIC
  Filled 2024-11-12: qty 1

## 2024-11-12 MED ORDER — ACETAMINOPHEN 325 MG PO TABS: 650.0000 mg | ORAL_TABLET | Freq: Four times a day (QID) | ORAL | Status: DC | PRN

## 2024-11-12 MED ORDER — POLYVINYL ALCOHOL 1.4 % OP SOLN: 1.0000 [drp] | OPHTHALMIC | Status: DC | PRN

## 2024-11-12 MED ORDER — OSELTAMIVIR PHOSPHATE 75 MG PO CAPS
75.0000 mg | ORAL_CAPSULE | Freq: Once | ORAL | Status: AC
  Administered 2024-11-12: 75 mg via ORAL
  Filled 2024-11-12: qty 1

## 2024-11-12 MED ORDER — OSELTAMIVIR PHOSPHATE 30 MG PO CAPS
30.0000 mg | ORAL_CAPSULE | Freq: Two times a day (BID) | ORAL | Status: DC
  Administered 2024-11-13: 30 mg via ORAL
  Filled 2024-11-12: qty 1

## 2024-11-12 MED ORDER — ALBUTEROL SULFATE (2.5 MG/3ML) 0.083% IN NEBU: 3.0000 mL | INHALATION_SOLUTION | RESPIRATORY_TRACT | Status: DC | PRN

## 2024-11-12 MED ORDER — ENOXAPARIN SODIUM 40 MG/0.4ML IJ SOSY
40.0000 mg | PREFILLED_SYRINGE | INTRAMUSCULAR | Status: DC
  Administered 2024-11-12: 40 mg via SUBCUTANEOUS
  Filled 2024-11-12: qty 0.4

## 2024-11-12 MED ORDER — LACTATED RINGERS IV BOLUS
1000.0000 mL | Freq: Once | INTRAVENOUS | Status: AC
  Administered 2024-11-12: 1000 mL via INTRAVENOUS

## 2024-11-12 MED ORDER — VITAMIN D 25 MCG (1000 UNIT) PO TABS
1000.0000 [IU] | ORAL_TABLET | Freq: Every day | ORAL | Status: DC
  Administered 2024-11-13: 1000 [IU] via ORAL
  Filled 2024-11-12: qty 1

## 2024-11-12 MED ORDER — SERTRALINE HCL 50 MG PO TABS
100.0000 mg | ORAL_TABLET | Freq: Every day | ORAL | Status: DC
  Administered 2024-11-13: 100 mg via ORAL
  Filled 2024-11-12: qty 2

## 2024-11-12 MED ORDER — DM-GUAIFENESIN ER 30-600 MG PO TB12
1.0000 | ORAL_TABLET | Freq: Two times a day (BID) | ORAL | Status: DC | PRN
  Filled 2024-11-12: qty 1

## 2024-11-12 MED ORDER — VITAMIN C 500 MG PO TABS
500.0000 mg | ORAL_TABLET | Freq: Every day | ORAL | Status: DC
  Administered 2024-11-13: 500 mg via ORAL
  Filled 2024-11-12: qty 1

## 2024-11-12 MED ORDER — ONDANSETRON HCL 4 MG/2ML IJ SOLN: 4.0000 mg | Freq: Three times a day (TID) | INTRAMUSCULAR | Status: DC | PRN

## 2024-11-12 MED ORDER — PROSIGHT PO TABS
1.0000 | ORAL_TABLET | Freq: Every day | ORAL | Status: DC
  Administered 2024-11-13: 1 via ORAL
  Filled 2024-11-12: qty 1

## 2024-11-12 MED ORDER — LATANOPROST 0.005 % OP SOLN
1.0000 [drp] | Freq: Every day | OPHTHALMIC | Status: DC
  Administered 2024-11-12: 1 [drp] via OPHTHALMIC
  Filled 2024-11-12: qty 2.5

## 2024-11-12 MED ORDER — HYDRALAZINE HCL 20 MG/ML IJ SOLN: 5.0000 mg | INTRAMUSCULAR | Status: DC | PRN

## 2024-11-12 NOTE — ED Notes (Signed)
 CCMD called to admit patient to monitor

## 2024-11-12 NOTE — H&P (Signed)
 History and Physical    Juan Horn FMW:[id number] DOB: 10/16/1933 DOA: 11/12/2024  Referring MD/NP/PA:   PCP: Gil Greig BRAVO, NP   Patient coming from:  The patient is coming from SNF.     Chief Complaint: Cough, generalized weakness, fall  HPI: Juan Horn is a 89 y.o. male with medical history significant of hyperlipidemia,dCHF, GERD, depression with anxiety, CKD 3A, remote DVT not on anticoagulants, right eye blindness, hard of hearing, skin cancer, prostate cancer, who presents with cough, generalized weakness and fall.  Patient states that he has generalized weakness for more than 1 week.  No unilateral numbness or tingling in extremities.  He fell 3 times, no LOC.  Last fall was on Tuesday.  Denies head or neck injury.  Patient has cough with little mucus production in the past 3 days.  No SOB, chest pain, fever, chills.  No nausea, vomiting, diarrhea or abdominal pain.  No symptoms of UTI.  Data reviewed independently and ED Course: pt was found to have positive PCR for influenza A, WBC 8.2, slightly worsening renal function.  Temperature normal, blood pressure 152/61, heart rate in 90s, RR 16, oxygen saturation 97% on room air.  CT of head and neck for acute injury.  Chest x-ray showed elevation of her left hemidiaphragm without infiltration.  Patient is placed in telemetry bed for observation.   EKG: I have personally reviewed.  Sinus rhythm, QTc 455, bifascicular block, poor IV progression.   Review of Systems:   General: no fevers, chills, no body weight gain, has fatigue HEENT: no blurry vision, hearing changes or sore throat. Has right eye blindness Respiratory: no dyspnea, has coughing, no wheezing CV: no chest pain, no palpitations GI: no nausea, vomiting, abdominal pain, diarrhea, constipation GU: no dysuria, burning on urination, increased urinary frequency, hematuria  Ext: has trace leg edema Neuro: no unilateral weakness, numbness, or  tingling, no vision change or hearing loss Skin: no rash, no skin tear. MSK: No muscle spasm, no deformity, no limitation of range of movement in spin Heme: No easy bruising.  Travel history: No recent long distant travel.   Allergy: Allergies[1]  Past Medical History:  Diagnosis Date   Actinic keratosis    Anxiety    BCC (basal cell carcinoma of skin) 07/24/2021   Left upper forehead, [facility]   Cancer (HCC) 2010   Prostate Cancer   Cataract    left eye   Chronic renal disease, stage III (HCC)    Chronic venous insufficiency    Clotting disorder 2013   blood clot 3 days post knee surgery   Diverticulosis of colon    DVT (deep venous thrombosis) (HCC) 2012   after knee replacement   Glaucoma    HLD (hyperlipidemia)    Hx of basal cell carcinoma 12/23/2018   L nasal tip   Hx of basal cell carcinoma 12/23/2018   R preauricular   Hx of colonic polyp    Macular degeneration    legally blind in right eye   OA (osteoarthritis)    Personal history of prostate cancer    Pulmonary embolism (HCC) 10/2012   post op TKR   PVC (premature ventricular contraction)    Spinal stenosis of lumbar region    Squamous cell carcinoma of skin 05/06/2018   L dorsal forearm near anticubital    Past Surgical History:  Procedure Laterality Date   APPENDECTOMY     CATARACT EXTRACTION W/PHACO Left 06/24/2019   Procedure: CATARACT EXTRACTION PHACO AND  INTRAOCULAR LENS PLACEMENT (IOC) LEFT;  Surgeon: Mittie Gaskin, MD;  Location: [facility];  Service: Ophthalmology;  Laterality: Left;   COLONOSCOPY  2011   INGUINAL HERNIA REPAIR     left   INGUINAL HERNIA REPAIR  5/12   Dr Jesslyn   INGUINAL HERNIA REPAIR  5/12   Dr Dellie did redo of this   JOINT REPLACEMENT  11/13   Left total knee--Dr [facility]   KNEE SURGERY  2013   POLYPECTOMY  2011   PROSTATECTOMY  2010   TONSILLECTOMY     VARICOSE VEIN SURGERY     left    Social History:  reports that he quit smoking about 40  years ago. His smoking use included cigarettes. He has never used smokeless tobacco. He reports current alcohol use of about 7.0 standard drinks of alcohol per week. He reports that he does not use drugs.  Family History:  Family History  Problem Relation Age of Onset   Dementia Mother    Stroke Father    Leukemia Sister    Colon cancer Neg Hx      Prior to Admission medications  Medication Sig Start Date End Date Taking? Authorizing Provider  acetaminophen  (TYLENOL ) 500 MG tablet Take 1,000 mg by mouth daily as needed for headache or mild pain (pain score 1-3).   Yes [provider]  Ascorbic Acid (VITAMIN C PO) Take by mouth daily.   Yes [provider]  Cholecalciferol  (VITAMIN D3 PO) Take 1,000 Units by mouth daily.   Yes [provider]  erythromycin ophthalmic ointment Place 1 Application into both eyes at bedtime. 11/06/23  Yes [provider]  hepatitis B vacrRecombinant (RECOMBIVAX HB) 5 MCG/0.5ML SUSY injection Inject 1 mL into the muscle as directed. 08/13/24  Yes Beamer, Richerd, MD  hydroxypropyl methylcellulose / hypromellose (ISOPTO TEARS / GONIOVISC) 2.5 % ophthalmic solution 1 drop as needed for dry eyes.   Yes [provider]  latanoprost  (XALATAN ) 0.005 % ophthalmic solution Place 1 drop into both eyes at bedtime.   Yes [provider]  Multiple Vitamins-Minerals (PRESERVISION AREDS 2 PO) Take 2 tablets by mouth daily.    Yes [provider]  neomycin-polymyxin b -dexamethasone  (MAXITROL) 3.5-10000-0.1 SUSP Place 1 drop into the left eye 3 (three) times daily. 06/17/24  Yes [provider]  pravastatin  (PRAVACHOL ) 20 MG tablet Take 1 tablet (20 mg total) by mouth daily. 09/29/24  Yes Fargo, Amy E, NP  sertraline  (ZOLOFT ) 100 MG tablet Take 1 tablet (100 mg total) by mouth daily. 10/15/24 10/15/25 Yes Fargo, Amy E, NP  ketoconazole (NIZORAL) 2 % cream Apply topically 2 (two) times daily. Patient not  taking: Reported on 11/12/2024 09/17/24   [provider]  Wound Dressings (MEPILEX) PADS Apply 1 Application topically every 3 (three) days. 08/21/23   Abdul Richerd, MD    Physical Exam: Vitals:   11/12/24 1156 11/12/24 1159 11/12/24 1540  BP: 123/60  (!) 152/61  Pulse: 90  92  Resp: 17  16  Temp: 98 F (36.7 C)  98.2 F (36.8 C)  TempSrc: Oral  Oral  SpO2: 100%  97%  Weight:  81.6 kg   Height:  5' 7 (1.702 m)    General: Not in acute distress HEENT:       Eyes: left eye PERRL, EOMI, no jaundice       ENT: No discharge from the ears and nose, no pharynx injection, no tonsillar enlargement.  Neck: No JVD, no bruit, no mass felt. Heme: No neck lymph node enlargement. Cardiac: S1/S2, RRR, No murmurs, No gallops or rubs. Respiratory: No rales, wheezing, rhonchi or rubs. GI: Soft, nondistended, nontender, no rebound pain, no organomegaly, BS present. GU: No hematuria Ext: has trace leg edema bilaterally. 1+DP/PT pulse bilaterally. Musculoskeletal: No joint deformities, No joint redness or warmth, no limitation of ROM in spin. Skin: No rashes.  Neuro: Alert, oriented X3, cranial nerves II-XII grossly intact (has right eye blindness), moves all extremities normally.  Psych: Patient is not psychotic, no suicidal or hemocidal ideation.  Labs on Admission: I have personally reviewed following labs and imaging studies  CBC: Recent Labs  Lab 11/12/24 1202  WBC 8.2  HGB 14.9  HCT 45.8  MCV 96.4  PLT 136*   Basic Metabolic Panel: Recent Labs  Lab 11/12/24 1202  NA 137  K 3.8  CL 99  CO2 25  GLUCOSE 105*  BUN 32*  CREATININE 1.38*  CALCIUM 9.7   GFR: Estimated Creatinine Clearance: 37.1 mL/min (A) (by C-G formula based on SCr of 1.38 mg/dL (H)). Liver Function Tests: Recent Labs  Lab 11/12/24 1202  AST 59*  ALT 38  ALKPHOS 82  BILITOT 0.7  PROT 8.1  ALBUMIN 4.2   No results for input(s): LIPASE, AMYLASE in the last 168 hours. No  results for input(s): AMMONIA in the last 168 hours. Coagulation Profile: No results for input(s): INR, PROTIME in the last 168 hours. Cardiac Enzymes: No results for input(s): CKTOTAL, CKMB, CKMBINDEX, TROPONINI in the last 168 hours. BNP (last 3 results) Recent Labs    11/12/24 1202  PROBNP 524.0*   HbA1C: No results for input(s): HGBA1C in the last 72 hours. CBG: No results for input(s): GLUCAP in the last 168 hours. Lipid Profile: No results for input(s): CHOL, HDL, LDLCALC, TRIG, CHOLHDL, LDLDIRECT in the last 72 hours. Thyroid  Function Tests: No results for input(s): TSH, T4TOTAL, FREET4, T3FREE, THYROIDAB in the last 72 hours. Anemia Panel: No results for input(s): VITAMINB12, FOLATE, FERRITIN, TIBC, IRON, RETICCTPCT in the last 72 hours. Urine analysis:    Component Value Date/Time   COLORURINE YELLOW (A) 11/12/2024 1629   APPEARANCEUR HAZY (A) 11/12/2024 1629   APPEARANCEUR Hazy 09/15/2012 0802   LABSPEC 1.021 11/12/2024 1629   LABSPEC 1.017 09/15/2012 0802   PHURINE 5.0 11/12/2024 1629   GLUCOSEU NEGATIVE 11/12/2024 1629   GLUCOSEU 50 mg/dL 89/85/7986 9197   HGBUR NEGATIVE 11/12/2024 1629   BILIRUBINUR NEGATIVE 11/12/2024 1629   BILIRUBINUR Negative 09/15/2012 0802   KETONESUR 5 (A) 11/12/2024 1629   PROTEINUR 30 (A) 11/12/2024 1629   NITRITE NEGATIVE 11/12/2024 1629   LEUKOCYTESUR NEGATIVE 11/12/2024 1629   LEUKOCYTESUR Negative 09/15/2012 0802   Sepsis Labs: @LABRCNTIP (procalcitonin:4,lacticidven:4) ) Recent Results (from the past 240 hours)  Resp panel by RT-PCR (RSV, Flu A&B, Covid) Anterior Nasal Swab     Status: Abnormal   Collection Time: 11/12/24  5:01 PM   Specimen: Anterior Nasal Swab  Result Value Ref Range Status   SARS Coronavirus 2 by RT PCR NEGATIVE NEGATIVE Final    Comment: (NOTE) SARS-CoV-2 target nucleic acids are NOT DETECTED.  The SARS-CoV-2 RNA is generally detectable in upper  respiratory specimens during the acute phase of infection. The lowest concentration of SARS-CoV-2 viral copies this assay can detect is 138 copies/mL. A negative result does not preclude SARS-Cov-2 infection and should not be used as the sole basis for treatment or other patient management decisions. A negative result  may occur with  improper specimen collection/handling, submission of specimen other than nasopharyngeal swab, presence of viral mutation(s) within the areas targeted by this assay, and inadequate number of viral copies(<138 copies/mL). A negative result must be combined with clinical observations, patient history, and epidemiological information. The expected result is Negative.  Fact Sheet for Patients:  bloggercourse.com  Fact Sheet for Healthcare Providers:  seriousbroker.it  This test is no t yet approved or cleared by the [place]  FDA and  has been authorized for detection and/or diagnosis of SARS-CoV-2 by FDA under an Emergency Use Authorization (EUA). This EUA will remain  in effect (meaning this test can be used) for the duration of the COVID-19 declaration under Section 564(b)(1) of the Act, 21 U.S.C.section 360bbb-3(b)(1), unless the authorization is terminated  or revoked sooner.       Influenza A by PCR POSITIVE (A) NEGATIVE Final   Influenza B by PCR NEGATIVE NEGATIVE Final    Comment: (NOTE) The Xpert Xpress SARS-CoV-2/FLU/RSV plus assay is intended as an aid in the diagnosis of influenza from Nasopharyngeal swab specimens and should not be used as a sole basis for treatment. Nasal washings and aspirates are unacceptable for Xpert Xpress SARS-CoV-2/FLU/RSV testing.  Fact Sheet for Patients: bloggercourse.com  Fact Sheet for Healthcare Providers: seriousbroker.it  This test is not yet approved or cleared by the [place]  FDA and has been  authorized for detection and/or diagnosis of SARS-CoV-2 by FDA under an Emergency Use Authorization (EUA). This EUA will remain in effect (meaning this test can be used) for the duration of the COVID-19 declaration under Section 564(b)(1) of the Act, 21 U.S.C. section 360bbb-3(b)(1), unless the authorization is terminated or revoked.     Resp Syncytial Virus by PCR NEGATIVE NEGATIVE Final    Comment: (NOTE) Fact Sheet for Patients: bloggercourse.com  Fact Sheet for Healthcare Providers: seriousbroker.it  This test is not yet approved or cleared by the [place]  FDA and has been authorized for detection and/or diagnosis of SARS-CoV-2 by FDA under an Emergency Use Authorization (EUA). This EUA will remain in effect (meaning this test can be used) for the duration of the COVID-19 declaration under Section 564(b)(1) of the Act, 21 U.S.C. section 360bbb-3(b)(1), unless the authorization is terminated or revoked.  Performed at [facility], [address], [place], [place] [address]      Radiological Exams on Admission:   Assessment/Plan Principal Problem:   Influenza A Active Problems:   Fall at home, initial encounter   Chronic diastolic CHF (congestive heart failure) (HCC)   HLD (hyperlipidemia)   Chronic kidney disease, stage 3a (HCC)   Depression with anxiety   Overweight (BMI 25.0-29.9)   Assessment and Plan:  Influenza A: Patient has cough in the past 3 days, no fever or chills, no SOB or chest pain, no infiltrate on chest x-ray.  Oxygen saturation 97% on room air.  -Placed in telemetry bed for observation - Tamiflu 30 mg twice daily - As needed albuterol - As needed Mucinex  Fall at home, initial encounter: CT of head and neck negative for acute injury. - Fall precaution - PT/OT  Chronic diastolic CHF (congestive heart failure) (HCC): 2D echo on 01/18/2017 showed EF> 55% with grade 1 diastolic  dysfunction.  Patient has trace leg edema, no SOB, BNP 524.  CHF seems to be compensated. -Watch volume status closely.  HLD (hyperlipidemia) -Pravastatin   Chronic kidney disease, stage 3a (HCC): Renal function slightly worsening baseline.  Baseline creatinine 1.15 on 08/10/2024.  His creatinine is 1.38, BUN 32, GFR 49 -Follow-up with BMP - 1 L LR was given in ED  Depression with anxiety -Zoloft   Overweight (BMI 25.0-29.9): Body weight 85.6 kg, BMI 28.19 - Encourage losing weight - Exercise and healthy diet      DVT ppx: SQ Lovenox   Code Status: DNR per pt  Family Communication:     not done, no family member is at bed side.  Disposition Plan:  Anticipate discharge back to previous environment  Consults called:  none  Admission status and Level of care: Telemetry:    for obs   Dispo: The patient is from: SNF              Anticipated d/c is to: SNF              Anticipated d/c date is: 1 day              Patient currently is not medically stable to d/c.    Severity of Illness:  The appropriate patient status for this patient is OBSERVATION. Observation status is judged to be reasonable and necessary in order to provide the required intensity of service to ensure the patient's safety. The patient's presenting symptoms, physical exam findings, and initial radiographic and laboratory data in the context of their medical condition is felt to place them at decreased risk for further clinical deterioration. Furthermore, it is anticipated that the patient will be medically stable for discharge from the hospital within 2 midnights of admission.        Date of Service 11/12/2024    Caleb Exon [facility]   If 7PM-7AM, please contact night-coverage [email or web address] 11/12/2024, 7:53 PM     [1]  Allergies Allergen Reactions   Naphazoline-Polyethyl Glycol Other (See Comments)    (Afgan) redness

## 2024-11-12 NOTE — ED Provider Notes (Signed)
 [facility] Provider Note    Event Date/Time   First MD Initiated Contact with Patient 11/12/24 1548     (approximate)   History   Chief Complaint Weakness and Fall   HPI  Juan Horn is a 89 y.o. male with past medical history of hyperlipidemia, CKD, venous insufficiency, and DVT/PE who presents to the ED complaining of generalized weakness and falls.  Patient reports that he has difficulty walking at baseline, typically will only travel short distances using his walker.  Over the past 1 to 2 weeks, he has had increased weakness in his lower extremities with 3-4 falls.  He denies hitting his head or losing consciousness, denies any injuries with the falls.  He does state that he has been dealing with a cough, but denies any fevers, chest pain, or shortness of breath.  He has not had any nausea, vomiting, diarrhea, dysuria, or flank pain.     Physical Exam   Triage Vital Signs: ED Triage Vitals  Encounter Vitals Group     BP 11/12/24 1156 123/60     Girls Systolic BP Percentile --      Girls Diastolic BP Percentile --      Boys Systolic BP Percentile --      Boys Diastolic BP Percentile --      Pulse Rate 11/12/24 1156 90     Resp 11/12/24 1156 17     Temp 11/12/24 1156 98 F (36.7 C)     Temp Source 11/12/24 1156 Oral     SpO2 11/12/24 1156 100 %     Weight 11/12/24 1159 180 lb (81.6 kg)     Height 11/12/24 1159 5' 7 (1.702 m)     Head Circumference --      Peak Flow --      Pain Score 11/12/24 1157 0     Pain Loc --      Pain Education --      Exclude from Growth Chart --     Most recent vital signs: Vitals:   11/12/24 1156 11/12/24 1540  BP: 123/60 (!) 152/61  Pulse: 90 92  Resp: 17 16  Temp: 98 F (36.7 C) 98.2 F (36.8 C)  SpO2: 100% 97%    Constitutional: Alert and oriented. Eyes: Conjunctivae are normal. Head: Atraumatic. Nose: No congestion/rhinnorhea. Mouth/Throat: Mucous membranes are moist.  Neck: No  midline cervical spine tenderness to palpation. Cardiovascular: Normal rate, regular rhythm. Grossly normal heart sounds.  2+ radial pulses bilaterally. Respiratory: Normal respiratory effort.  No retractions. Lungs CTAB.  No chest wall tenderness to palpation. Gastrointestinal: Soft and nontender. No distention. Musculoskeletal: No lower extremity tenderness nor edema.  No upper extremity bony tenderness. Neurologic:  Normal speech and language. No gross focal neurologic deficits are appreciated.    ED Results / Procedures / Treatments   Labs (all labs ordered are listed, but only abnormal results are displayed) Labs Reviewed  RESP PANEL BY RT-PCR (RSV, FLU A&B, COVID)  [id number] - Abnormal; Notable for the following components:      Result Value   Influenza A by PCR POSITIVE (*)    All other components within normal limits  COMPREHENSIVE METABOLIC PANEL WITH GFR - Abnormal; Notable for the following components:   Glucose, Bld 105 (*)    BUN 32 (*)    Creatinine, Ser 1.38 (*)    AST 59 (*)    GFR, Estimated 49 (*)    All other components within normal limits  CBC - Abnormal; Notable for the following components:   Platelets 136 (*)    All other components within normal limits  URINALYSIS, ROUTINE W REFLEX MICROSCOPIC - Abnormal; Notable for the following components:   Color, Urine YELLOW (*)    APPearance HAZY (*)    Ketones, ur 5 (*)    Protein, ur 30 (*)    Bacteria, UA RARE (*)    All other components within normal limits  PRO BRAIN NATRIURETIC PEPTIDE  CBG MONITORING, ED     EKG  ED ECG REPORT I, Carlin Palin, the attending physician, personally viewed and interpreted this ECG.   Date: 11/12/2024  EKG Time: 11:58  Rate: 91  Rhythm: normal sinus rhythm  Axis: LAD  Intervals:right bundle branch block  ST&T Change: None  RADIOLOGY Chest x-ray reviewed and interpreted by me with no infiltrate, edema, or effusion.  PROCEDURES:  Critical Care performed:  No  Procedures   MEDICATIONS ORDERED IN ED: Medications  dextromethorphan-guaiFENesin (MUCINEX DM) 30-600 MG per 12 hr tablet 1 tablet (has no administration in time range)  ondansetron  (ZOFRAN ) injection 4 mg (has no administration in time range)  hydrALAZINE (APRESOLINE) injection 5 mg (has no administration in time range)  acetaminophen  (TYLENOL ) tablet 650 mg (has no administration in time range)  lactated ringers bolus 1,000 mL (0 mLs Intravenous Stopped 11/12/24 1746)     IMPRESSION / MDM / ASSESSMENT AND PLAN / ED COURSE  I reviewed the triage vital signs and the nursing notes.                              89 y.o. male with past medical history of hyperlipidemia, CKD, venous insufficiency, and DVT/PE who presents to the ED with increasing generalized weakness over the past 1 to 2 weeks with multiple falls.  Patient's presentation is most consistent with acute presentation with potential threat to life or bodily function.  Differential diagnosis includes, but is not limited to, intracranial injury, cervical spine injury, pneumonia, UTI, viral syndrome, anemia, electrolyte abnormality, AKI.  Patient nontoxic-appearing and in no acute distress, vital signs are unremarkable.  He is alert and oriented, has no focal or neurologic deficits on exam.  We will check CT head and cervical spine for evidence of trauma versus stroke, also check chest x-ray and urinalysis for possible infection.  Labs with mild AKI but no significant anemia, leukocytosis, or electrolyte abnormality.  LFTs are also unremarkable.  CT head and cervical spine are negative for acute finding, chest x-ray unremarkable and urinalysis without signs of infection.  Patient's viral panel did come back positive for influenza, which seems to be the source of his cough with generalized weakness.  He is unable to ambulate at all here in the ED, even with the assistance of a walker, currently unsafe to return to his independent  living facility.  Case discussed with hospitalist for admission.      FINAL CLINICAL IMPRESSION(S) / ED DIAGNOSES   Final diagnoses:  Generalized weakness  Multiple falls  Influenza A     Rx / DC Orders   ED Discharge Orders     None        Note:  This document was prepared using Dragon voice recognition software and may include unintentional dictation errors.   Palin Carlin, MD 11/12/24 [phone]

## 2024-11-12 NOTE — ED Notes (Signed)
 Patient given dinner tray and ice water.

## 2024-11-12 NOTE — ED Triage Notes (Signed)
 Pt arrives via [facility] from [facility] with several falls recently and weakness. Pt denies any pain, LOC, and states that they did not hit their head with this most recent fall. Pt is A&Ox4 during triage.

## 2024-11-12 NOTE — ED Triage Notes (Signed)
 First Nurse Note:  Pt via [facility] from [facility] independent living. Pt c/o increased weakness, states now he needs assistance. Has had some increased fall. Reports some swelling in his bilateral feet. VSS. Pt is A&Ox4 and NAD EMS reports 98.2 orally, 123/77, 99% on RA, 83 HR

## 2024-11-12 NOTE — ED Notes (Signed)
 Patient attempted to ambulate with this RN using the walker. Patient was unable to take a step and stated that he felt very weak and not comfortable with walking. Patient returned to bed and placed in a comfortable position. Patient denies other needs at this time. MD Jessup notified.

## 2024-11-13 DIAGNOSIS — J101 Influenza due to other identified influenza virus with other respiratory manifestations: Secondary | ICD-10-CM | POA: Diagnosis not present

## 2024-11-13 LAB — VITAMIN B12: Vitamin B-12: 501 pg/mL (ref 180–914)

## 2024-11-13 LAB — BASIC METABOLIC PANEL WITH GFR
Anion gap: 9 (ref 5–15)
BUN: 29 mg/dL — ABNORMAL HIGH (ref 8–23)
CO2: 27 mmol/L (ref 22–32)
Calcium: 8.7 mg/dL — ABNORMAL LOW (ref 8.9–10.3)
Chloride: 100 mmol/L (ref 98–111)
Creatinine, Ser: 1.22 mg/dL (ref 0.61–1.24)
GFR, Estimated: 57 mL/min — ABNORMAL LOW (ref >60)
Glucose, Bld: 80 mg/dL (ref 70–99)
Potassium: 3.4 mmol/L — ABNORMAL LOW (ref 3.5–5.1)
Sodium: 137 mmol/L (ref 135–145)

## 2024-11-13 LAB — IRON AND TIBC
Iron: 25 ug/dL — ABNORMAL LOW (ref 45–182)
Saturation Ratios: 10 % — ABNORMAL LOW (ref 17.9–39.5)
TIBC: 251 ug/dL (ref 250–450)
UIBC: 226 ug/dL

## 2024-11-13 LAB — CBC
HCT: 40.3 % (ref 39.0–52.0)
Hemoglobin: 13 g/dL (ref 13.0–17.0)
MCH: 31.3 pg (ref 26.0–34.0)
MCHC: 32.3 g/dL (ref 30.0–36.0)
MCV: 96.9 fL (ref 80.0–100.0)
Platelets: 110 K/uL — ABNORMAL LOW (ref 150–400)
RBC: 4.16 MIL/uL — ABNORMAL LOW (ref 4.22–5.81)
RDW: 13.1 % (ref 11.5–15.5)
WBC: 6.5 K/uL (ref 4.0–10.5)
nRBC: 0 % (ref 0.0–0.2)

## 2024-11-13 LAB — PHOSPHORUS: Phosphorus: 2.4 mg/dL — ABNORMAL LOW (ref 2.5–4.6)

## 2024-11-13 LAB — MAGNESIUM: Magnesium: 2.1 mg/dL (ref 1.7–2.4)

## 2024-11-13 LAB — FOLATE: Folate: >20 ng/mL (ref >5.9)

## 2024-11-13 MED ORDER — POTASSIUM CHLORIDE CRYS ER 20 MEQ PO TBCR
40.0000 meq | EXTENDED_RELEASE_TABLET | Freq: Once | ORAL | Status: AC
  Administered 2024-11-13: 40 meq via ORAL
  Filled 2024-11-13: qty 2

## 2024-11-13 MED ORDER — VITAMIN C 250 MG PO TABS: 250.0000 mg | ORAL_TABLET | Freq: Every day | ORAL | Status: DC

## 2024-11-13 MED ORDER — OSELTAMIVIR PHOSPHATE 30 MG PO CAPS: 30.0000 mg | ORAL_CAPSULE | Freq: Two times a day (BID) | ORAL | Status: AC

## 2024-11-13 MED ORDER — K PHOS MONO-SOD PHOS DI & MONO 155-852-130 MG PO TABS
500.0000 mg | ORAL_TABLET | Freq: Four times a day (QID) | ORAL | Status: DC
  Administered 2024-11-13: 500 mg via ORAL
  Filled 2024-11-13: qty 2

## 2024-11-13 MED ORDER — POLYSACCHARIDE IRON COMPLEX 150 MG PO CAPS: 150.0000 mg | ORAL_CAPSULE | Freq: Every day | ORAL | Status: AC

## 2024-11-13 MED ORDER — POTASSIUM CHLORIDE 20 MEQ PO PACK
40.0000 meq | PACK | Freq: Once | ORAL | Status: AC
  Filled 2024-11-13: qty 2

## 2024-11-13 MED ORDER — DM-GUAIFENESIN ER 30-600 MG PO TB12: 1.0000 | ORAL_TABLET | Freq: Two times a day (BID) | ORAL | Status: AC | PRN

## 2024-11-13 NOTE — TOC Initial Note (Signed)
 Transition of Care [facility]) - Initial/Assessment Note    Patient Details  Name: Juan Horn MRN: [id number] Date of Birth: 19-Oct-1933  Transition of Care [facility]) CM/SW Contact:    [name], RN Phone Number: 11/13/2024, 12:06 PM  Clinical Narrative:                 Patient placed under observation for Flu.  CM met with patient and his wife at the bedside, introduced self and explained role as CM.  He and his wife are from [facility] independent living facility.  He would like to go to [facility] for STR.  He is usually able to take care of himself independently.  Referral sent to [facility].    Expected Discharge Plan: Skilled Nursing Facility Barriers to Discharge: Continued Medical Work up   Patient Goals and CMS Choice Patient states their goals for this hospitalization and ongoing recovery are:: To go to [facility] for [employer].gov Compare Post Acute Care list provided to:: Patient Choice offered to / list presented to : Patient      Expected Discharge Plan and Services   Discharge Planning Services: CM Consult Post Acute Care Choice: Skilled Nursing Facility Living arrangements for the past 2 months: Independent Living Facility                 DME Arranged: N/A         HH Arranged: NA          Prior Living Arrangements/Services Living arrangements for the past 2 months: Independent Living Facility Lives with:: Spouse Patient language and need for interpreter reviewed:: Yes Do you feel safe going back to the place where you live?: Yes      Need for Family Participation in Patient Care: Yes (Comment) Care giver support system in place?: Yes (comment) Current home services: DME (Power wheelchair) Criminal Activity/Legal Involvement Pertinent to Current Situation/Hospitalization: No - Comment as needed  Activities of Daily Living   ADL Screening (condition at time of admission) Independently performs ADLs?: Yes (appropriate for  developmental age) Is the patient deaf or have difficulty hearing?: No Does the patient have difficulty seeing, even when wearing glasses/contacts?: No Does the patient have difficulty concentrating, remembering, or making decisions?: No  Permission Sought/Granted Permission sought to share information with : Facility Medical Sales Representative, Family Supports Permission granted to share information with : Yes, Verbal Permission Granted  Share Information with NAME: Tatsuo Musial  Permission granted to share info w AGENCY: [facility]  Permission granted to share info w Relationship: spouse  Permission granted to share info w Contact Information: [phone]  Emotional Assessment Appearance:: Appears stated age Attitude/Demeanor/Rapport: Engaged Affect (typically observed): Accepting Orientation: : Oriented to Self, Oriented to Place, Oriented to  Time, Oriented to Situation Alcohol / Substance Use: Not Applicable Psych Involvement: No (comment)  Admission diagnosis:  Influenza A [J10.1] Generalized weakness [R53.1] MDD (major depressive disorder), recurrent, in full remission [F33.42] Multiple falls [R29.6] Patient Active Problem List   Diagnosis Date Noted   Influenza A 11/12/2024   Fall at home, initial encounter 11/12/2024   Chronic diastolic CHF (congestive heart failure) (HCC) 11/12/2024   Chronic kidney disease, stage 3a (HCC) 11/12/2024   Overweight (BMI 25.0-29.9) 11/12/2024   Depression with anxiety 11/12/2024   Mild cognitive impairment 10/03/2023   MDD (major depressive disorder), single episode, moderate (HCC) 07/04/2023   Claudication 07/04/2023   Pressure injury of right upper back, stage 1 07/04/2023   Pressure  injury of right hip, stage 1 07/04/2023   Decreased shoulder mobility, left 07/04/2023   Abnormal gait due to muscle weakness 12/24/2022   Senile ectropion of both lower eyelids 08/22/2021   Exposure keratitis 08/22/2021   Dermatochalasis of both upper  eyelids 08/22/2021   Acquired ptosis of both eyelids 08/22/2021   Pain due to onychomycosis of toenails of both feet 10/31/2020   Gastroesophageal reflux disease 10/15/2018   Stage 3a chronic kidney disease (HCC) 05/06/2018   Hearing loss 08/13/2017   MDD (major depressive disorder), recurrent, in full remission 01/22/2017   History of DVT (deep vein thrombosis) 01/22/2017   Symptomatic bradycardia 01/02/2017   Spinal stenosis, lumbar region, with neurogenic claudication 10/29/2016   DDD (degenerative disc disease), lumbar 12/20/2015   Advance directive discussed with patient 04/12/2015   Personal history of prostate cancer    Routine general medical examination at a health care facility 04/02/2013   Chronic venous insufficiency    Osteoarthrosis of knee 03/23/2011   HLD (hyperlipidemia)    Glaucoma    Age-related macular degeneration, wet, left eye (HCC)    Hyperlipemia 10/24/2010   Diverticulosis of colon 10/24/2010   PCP:  Gil Greig BRAVO, NP Pharmacy:   [facility] [phone] [place],  - [address] [place] [address] Phone: [phone] Fax: [phone]  [facility] - [place], [place] - [address] [place] [address] Phone: [phone] Fax: [phone]  [id number] [place], [place] - [address] [place] [address] Phone: [phone] Fax: [phone]     Social Drivers of Health (SDOH) Social History: SDOH Screenings   Food Insecurity: No Food Insecurity (11/12/2024)  Housing: Low Risk (11/12/2024)  Transportation Needs: No Transportation Needs (11/12/2024)  Utilities: Not At Risk (11/12/2024)  Depression (PHQ2-9): Low Risk (10/14/2024)  Social Connections: Moderately Integrated (11/12/2024)  Tobacco Use: Medium Risk (11/12/2024)   SDOH Interventions:     Readmission Risk Interventions     No data to display

## 2024-11-13 NOTE — NC FL2 (Signed)
 [place]  MEDICAID FL2 LEVEL OF CARE FORM     IDENTIFICATION  Patient Name: Juan Horn Birthdate: 1933/08/02 Sex: male Admission Date (Current Location): 11/12/2024  [facility] and [place] Number:  Chiropodist and Address:  [facility], [address] [place], [place] [address]      Provider Number: [id number]  Attending Physician Name and Address:  Von Bellis, MD  Relative Name and Phone Number:  Juan Horn- wife- [phone]    Current Level of Care: Hospital Recommended Level of Care: Skilled Nursing Facility Prior Approval Number:    Date Approved/Denied:   PASRR Number:    Discharge Plan: SNF    Current Diagnoses: Patient Active Problem List   Diagnosis Date Noted   Influenza A 11/12/2024   Fall at home, initial encounter 11/12/2024   Chronic diastolic CHF (congestive heart failure) (HCC) 11/12/2024   Chronic kidney disease, stage 3a (HCC) 11/12/2024   Overweight (BMI 25.0-29.9) 11/12/2024   Depression with anxiety 11/12/2024   Mild cognitive impairment 10/03/2023   MDD (major depressive disorder), single episode, moderate (HCC) 07/04/2023   Claudication 07/04/2023   Pressure injury of right upper back, stage 1 07/04/2023   Pressure injury of right hip, stage 1 07/04/2023   Decreased shoulder mobility, left 07/04/2023   Abnormal gait due to muscle weakness 12/24/2022   Senile ectropion of both lower eyelids 08/22/2021   Exposure keratitis 08/22/2021   Dermatochalasis of both upper eyelids 08/22/2021   Acquired ptosis of both eyelids 08/22/2021   Pain due to onychomycosis of toenails of both feet 10/31/2020   Gastroesophageal reflux disease 10/15/2018   Stage 3a chronic kidney disease (HCC) 05/06/2018   Hearing loss 08/13/2017   MDD (major depressive disorder), recurrent, in full remission 01/22/2017   History of DVT (deep vein thrombosis) 01/22/2017   Symptomatic bradycardia 01/02/2017   Spinal  stenosis, lumbar region, with neurogenic claudication 10/29/2016   DDD (degenerative disc disease), lumbar 12/20/2015   Advance directive discussed with patient 04/12/2015   Personal history of prostate cancer    Routine general medical examination at a health care facility 04/02/2013   Chronic venous insufficiency    Osteoarthrosis of knee 03/23/2011   HLD (hyperlipidemia)    Glaucoma    Age-related macular degeneration, wet, left eye (HCC)    Hyperlipemia 10/24/2010   Diverticulosis of colon 10/24/2010    Orientation RESPIRATION BLADDER Height & Weight     Self, Time, Situation, Place  Normal Continent Weight: 81.2 kg Height:  5' 7 (170.2 cm)  BEHAVIORAL SYMPTOMS/MOOD NEUROLOGICAL BOWEL NUTRITION STATUS      Continent Diet (Regular)  AMBULATORY STATUS COMMUNICATION OF NEEDS Skin   Extensive Assist Verbally Normal                       Personal Care Assistance Level of Assistance  Bathing, Feeding, Dressing Bathing Assistance: Limited assistance Feeding assistance: Independent Dressing Assistance: Limited assistance     Functional Limitations Info  Sight, Hearing, Speech Sight Info: Adequate Hearing Info: Adequate Speech Info: Adequate    SPECIAL CARE FACTORS FREQUENCY  PT (By licensed PT), OT (By licensed OT)     PT Frequency: 5 days per week OT Frequency: 5 days per week            Contractures Contractures Info: Not present    Additional Factors Info  Code Status, Allergies Code Status Info: Full Allergies Info: Naphazoline-polyethyl Glycol  Current Medications (11/13/2024):  This is the current hospital active medication list Current Facility-Administered Medications  Medication Dose Route Frequency Provider Last Rate Last Admin   acetaminophen  (TYLENOL ) tablet 650 mg  650 mg Oral Q6H PRN Niu, Xilin, MD       albuterol (PROVENTIL) (2.5 MG/3ML) 0.083% nebulizer solution 3 mL  3 mL Nebulization Q4H PRN Niu, Xilin, MD       artificial  tears ophthalmic solution 1 drop  1 drop Both Eyes PRN Niu, Xilin, MD       ascorbic acid (VITAMIN C) tablet 500 mg  500 mg Oral Daily Niu, Xilin, MD   500 mg at 11/13/24 9160   cholecalciferol  (VITAMIN D3) 25 MCG (1000 UNIT) tablet 1,000 Units  1,000 Units Oral Daily Niu, Xilin, MD   1,000 Units at 11/13/24 0839   dextromethorphan-guaiFENesin (MUCINEX DM) 30-600 MG per 12 hr tablet 1 tablet  1 tablet Oral BID PRN Niu, Xilin, MD       enoxaparin  (LOVENOX ) injection 40 mg  40 mg Subcutaneous Q24H Niu, Xilin, MD   40 mg at 11/12/24 2135   erythromycin ophthalmic ointment 1 Application  1 Application Both Eyes QHS Niu, Xilin, MD   1 Application at 11/12/24 2135   hydrALAZINE (APRESOLINE) injection 5 mg  5 mg Intravenous Q2H PRN Niu, Xilin, MD       latanoprost  (XALATAN ) 0.005 % ophthalmic solution 1 drop  1 drop Both Eyes QHS Niu, Xilin, MD   1 drop at 11/12/24 2134   multivitamin (PROSIGHT) tablet 1 tablet  1 tablet Oral Daily Niu, Xilin, MD   1 tablet at 11/13/24 0840   ondansetron  (ZOFRAN ) injection 4 mg  4 mg Intravenous Q8H PRN Niu, Xilin, MD       oseltamivir (TAMIFLU) capsule 30 mg  30 mg Oral BID Niu, Xilin, MD   30 mg at 11/13/24 0840   pravastatin  (PRAVACHOL ) tablet 20 mg  20 mg Oral QHS Niu, Xilin, MD   20 mg at 11/12/24 2134   sertraline  (ZOLOFT ) tablet 100 mg  100 mg Oral Daily Niu, Xilin, MD   100 mg at 11/13/24 9160     Discharge Medications: Please see discharge summary for a list of discharge medications.  Relevant Imaging Results:  Relevant Lab Results:   Additional Information SS#[id number]  [name], RN

## 2024-11-13 NOTE — Discharge Summary (Signed)
 [facility] Discharge Summary   Patient: Juan Horn FMW:[id number]  PCP: Gil Greig BRAVO, NP  Date of admission: 11/12/2024   Date of discharge:  11/13/2024     Discharge Diagnoses:  Principal Problem:   Influenza A Active Problems:   Fall at home, initial encounter   Chronic diastolic CHF (congestive heart failure) (HCC)   HLD (hyperlipidemia)   Chronic kidney disease, stage 3a (HCC)   Depression with anxiety   Overweight (BMI 25.0-29.9)   Admitted From: [facility] Disposition:  SNF [facility]  Recommendations for Outpatient Follow-up:  Follow-up with PCP, need to be seen by an MD in 1 to 2 days. Follow up LABS/TEST:  Repeat iron profile in between 3 to 6 months, follow pending B12 levels.   Contact information for after-discharge care     Destination     [facility] .   Service: Skilled Nursing Contact information: [address] [place]  [phone]                    Diet recommendation: Cardiac diet  Activity: The patient is advised to gradually reintroduce usual activities, as tolerated  Discharge Condition: stable  Code Status: DNR -Limited  History of present illness: As per the H and P dictated on admission.  Hospital Course:   Juan Horn is a 89 y.o. male with medical history significant of hyperlipidemia,dCHF, GERD, depression with anxiety, CKD 3A, remote DVT not on anticoagulants, right eye blindness, hard of hearing, skin cancer, prostate cancer, who presents with cough, generalized weakness and fall.   Patient states that he has generalized weakness for more than 1 week.  No unilateral numbness or tingling in extremities.  He fell 3 times, no LOC.  Last fall was on Tuesday.  Denies head or neck injury.  Patient has cough with little mucus production in the past 3 days.  No SOB, chest pain, fever, chills.  No nausea, vomiting, diarrhea or abdominal pain.  No symptoms of UTI.   Data  reviewed independently and ED Course: pt was found to have positive PCR for influenza A, WBC 8.2, slightly worsening renal function.  Temperature normal, blood pressure 152/61, heart rate in 90s, RR 16, oxygen saturation 97% on room air.  CT of head and neck for acute injury.  Chest x-ray showed elevation of her left hemidiaphragm without infiltration.  Patient is placed in telemetry bed for observation.     EKG: I have personally reviewed.  Sinus rhythm, QTc 455, bifascicular block, poor IV progression.   Assessment and Plan:   # Influenza A: Patient has cough in the past 3 days, no fever or chills, no SOB or chest pain, no infiltrate on chest x-ray.  Oxygen saturation 97% on room air. Continue Tamiflu 30 mg twice daily x 5 days total - As needed albuterol - As needed Mucinex   # Fall at home, initial encounter: CT of head and neck negative for acute injury.  Continue fall precaution. PT/OT eval done, recommended SNF placement.   # Chronic diastolic CHF (congestive heart failure) (HCC): 2D echo on 01/18/2017 showed EF> 55% with grade 1 diastolic dysfunction.  Patient has trace leg edema, no SOB, BNP 524.  CHF seems to be compensated. Watch volume status closely.   # HLD (hyperlipidemia): Pravastatin    # Chronic kidney disease, stage 3a (HCC): Renal function slightly worsening baseline.  Baseline creatinine 1.15 on 08/10/2024.  His creatinine is 1.38, BUN 32, GFR 49  S/p 1 L LR was given in ED sCr 1.38 >>1.22 improved after IV fluids.  # Hypokalemia, potassium repleted. # Hypophosphatemia.  Phos repleted. # Depression with anxiety: Zoloft   # Iron deficiency, Tsat 10%, started oral iron supplement with vitamin C.  Repeat iron profile in between 3 to 6 months. Folic acid level within normal range Follow-up pending B12 levels and replete accordingly.   # Overweight (BMI 25.0-29.9):  Body mass index is 28.04 kg/m.  - Encourage losing weight - Exercise and healthy diet  Patient was seen  by physical therapy, who recommended Therapy, SNF placement, which was arranged. On the day of the discharge the patient's vitals were stable, and no other acute medical condition were reported by patient. the patient was felt safe to be discharge at SNF with Therapy.  Consultants: None Procedures: None  Discharge Exam: General: Appear in no distress,  Oral Mucosa Clear, moist. Cardiovascular: S1 and S2 Present, no Murmur, Respiratory: normal respiratory effort, Bilateral Air entry present and no Crackles, no wheezes Abdomen: Bowel Sound present, Soft and no tenderness. Extremities: no Pedal edema, no calf tenderness Neurology: alert and oriented to time, place, and person affect appropriate.  Filed Weights   11/12/24 1159 11/12/24 2011  Weight: 81.6 kg 81.2 kg   Vitals:   11/13/24 0544 11/13/24 0752  BP: 132/61 (!) 124/57  Pulse: 83 82  Resp: 18 18  Temp: 98.3 F (36.8 C) 99 F (37.2 C)  SpO2: 95% 95%    DISCHARGE MEDICATION: Allergies as of 11/13/2024       Reactions   Naphazoline-polyethyl Glycol Other (See Comments)   (Afgan) redness        Medication List     STOP taking these medications    ketoconazole 2 % cream Commonly known as: NIZORAL       TAKE these medications    acetaminophen  500 MG tablet Commonly known as: TYLENOL  Take 1,000 mg by mouth daily as needed for headache or mild pain (pain score 1-3).   dextromethorphan-guaiFENesin 30-600 MG 12hr tablet Commonly known as: MUCINEX DM Take 1 tablet by mouth 2 (two) times daily as needed for cough.   erythromycin ophthalmic ointment Place 1 Application into both eyes at bedtime.   hepatitis B vacrRecombinant 5 MCG/0.5ML Susy injection Commonly known as: RECOMBIVAX HB Inject 1 mL into the muscle as directed.   hydroxypropyl methylcellulose / hypromellose 2.5 % ophthalmic solution Commonly known as: ISOPTO TEARS / GONIOVISC 1 drop as needed for dry eyes.   iron polysaccharides 150 MG  capsule Commonly known as: NIFEREX Take 1 capsule (150 mg total) by mouth daily.   latanoprost  0.005 % ophthalmic solution Commonly known as: XALATAN  Place 1 drop into both eyes at bedtime.   Mepilex Pads Apply 1 Application topically every 3 (three) days.   neomycin-polymyxin b -dexamethasone  3.5-10000-0.1 Susp Commonly known as: MAXITROL Place 1 drop into the left eye 3 (three) times daily.   oseltamivir 30 MG capsule Commonly known as: TAMIFLU Take 1 capsule (30 mg total) by mouth 2 (two) times daily for 8 doses.   pravastatin  20 MG tablet Commonly known as: PRAVACHOL  Take 1 tablet (20 mg total) by mouth daily.   PRESERVISION AREDS 2 PO Take 2 tablets by mouth daily.   sertraline  100 MG tablet Commonly known as: ZOLOFT  Take 1 tablet (100 mg total) by mouth daily.   vitamin C 250 MG tablet Commonly known as: ASCORBIC ACID Take 1 tablet (250 mg total) by mouth daily.   VITAMIN  C PO Take by mouth daily.   VITAMIN D3 PO Take 1,000 Units by mouth daily.       Allergies[1] Discharge Instructions     Call MD for:  difficulty breathing, headache or visual disturbances   Complete by: As directed    Call MD for:  extreme fatigue   Complete by: As directed    Call MD for:  persistant dizziness or light-headedness   Complete by: As directed    Call MD for:  persistant nausea and vomiting   Complete by: As directed    Call MD for:  redness, tenderness, or signs of infection (pain, swelling, redness, odor or green/yellow discharge around incision site)   Complete by: As directed    Call MD for:  severe uncontrolled pain   Complete by: As directed    Call MD for:  temperature >100.4   Complete by: As directed    Discharge instructions   Complete by: As directed    Follow-up with PCP, need to be seen by an MD in 1 to 2 days. Repeat iron profile in between 3 to 6 months, follow pending folate and B12 levels.   Increase activity slowly   Complete by: As directed         The results of significant diagnostics from this hospitalization (including imaging, microbiology, ancillary and laboratory) are listed below for reference.    Significant Diagnostic Studies: CT Cervical Spine Wo Contrast Result Date: 11/12/2024 EXAM: CT CERVICAL SPINE WITHOUT CONTRAST 11/12/2024 04:47:59 PM TECHNIQUE: CT of the cervical spine was performed without the administration of intravenous contrast. Multiplanar reformatted images are provided for review. Automated exposure control, iterative reconstruction, and/or weight based adjustment of the mA/kV was utilized to reduce the radiation dose to as Horn as reasonably achievable. COMPARISON: None available. CLINICAL HISTORY: Neck trauma (Age >= 65y) FINDINGS: CERVICAL SPINE: BONES AND ALIGNMENT: No acute fracture or traumatic malalignment. DEGENERATIVE CHANGES: Mild degenerative changes, most prominent at C5-C6. SOFT TISSUES: No prevertebral soft tissue swelling. IMPRESSION: 1. No acute abnormality of the cervical spine. Electronically signed by: Pinkie Pebbles MD 11/12/2024 05:16 PM EST RP Workstation: [id number]   CT Head Wo Contrast Result Date: 11/12/2024 EXAM: CT HEAD WITHOUT 11/12/2024 04:47:59 PM TECHNIQUE: CT of the head was performed without the administration of intravenous contrast. Automated exposure control, iterative reconstruction, and/or weight based adjustment of the mA/kV was utilized to reduce the radiation dose to as Horn as reasonably achievable. COMPARISON: 09/08/2024 CLINICAL HISTORY: Head trauma, minor (Age >= 65y) FINDINGS: BRAIN AND VENTRICLES: No acute intracranial hemorrhage. No mass effect or midline shift. No extra-axial fluid collection. No evidence of acute infarct. No hydrocephalus. Global cortical atrophy. Subcortical and periventricular small vessel ischemic changes. Intracranial atherosclerosis. ORBITS: No acute abnormality. SINUSES AND MASTOIDS: Partial opacification of the right frontal and ethmoid sinuses,  chronic. SOFT TISSUES AND SKULL: No acute skull fracture. No acute soft tissue abnormality. IMPRESSION: 1. No acute intracranial abnormality. Electronically signed by: Pinkie Pebbles MD 11/12/2024 05:16 PM EST RP Workstation: [id number]   DG Chest 2 View Result Date: 11/12/2024 EXAM: 2 VIEW(S) XRAY OF THE CHEST 11/12/2024 04:36:00 PM COMPARISON: 03/25/2017 CLINICAL HISTORY: Cough, weakness FINDINGS: LUNGS AND PLEURA: Elevated right hemidiaphragm is noted. Stable minimal right basilar scarring is noted. No pleural effusion. No pneumothorax. HEART AND MEDIASTINUM: No acute abnormality of the cardiac and mediastinal silhouettes. BONES AND SOFT TISSUES: No acute osseous abnormality. IMPRESSION: 1. Stable elevated left hemidiaphragm. 2. Stable minimal right basilar scarring. Electronically signed by: Lynwood Seip  MD 11/12/2024 04:59 PM EST RP Workstation: [id number]    Microbiology: Recent Results (from the past 240 hours)  Resp panel by RT-PCR (RSV, Flu A&B, Covid) Anterior Nasal Swab     Status: Abnormal   Collection Time: 11/12/24  5:01 PM   Specimen: Anterior Nasal Swab  Result Value Ref Range Status   SARS Coronavirus 2 by RT PCR NEGATIVE NEGATIVE Final    Comment: (NOTE) SARS-CoV-2 target nucleic acids are NOT DETECTED.  The SARS-CoV-2 RNA is generally detectable in upper respiratory specimens during the acute phase of infection. The lowest concentration of SARS-CoV-2 viral copies this assay can detect is 138 copies/mL. A negative result does not preclude SARS-Cov-2 infection and should not be used as the sole basis for treatment or other patient management decisions. A negative result may occur with  improper specimen collection/handling, submission of specimen other than nasopharyngeal swab, presence of viral mutation(s) within the areas targeted by this assay, and inadequate number of viral copies(<138 copies/mL). A negative result must be combined with clinical observations, patient  history, and epidemiological information. The expected result is Negative.  Fact Sheet for Patients:  bloggercourse.com  Fact Sheet for Healthcare Providers:  seriousbroker.it  This test is no t yet approved or cleared by the [place]  FDA and  has been authorized for detection and/or diagnosis of SARS-CoV-2 by FDA under an Emergency Use Authorization (EUA). This EUA will remain  in effect (meaning this test can be used) for the duration of the COVID-19 declaration under Section 564(b)(1) of the Act, 21 U.S.C.section 360bbb-3(b)(1), unless the authorization is terminated  or revoked sooner.       Influenza A by PCR POSITIVE (A) NEGATIVE Final   Influenza B by PCR NEGATIVE NEGATIVE Final    Comment: (NOTE) The Xpert Xpress SARS-CoV-2/FLU/RSV plus assay is intended as an aid in the diagnosis of influenza from Nasopharyngeal swab specimens and should not be used as a sole basis for treatment. Nasal washings and aspirates are unacceptable for Xpert Xpress SARS-CoV-2/FLU/RSV testing.  Fact Sheet for Patients: bloggercourse.com  Fact Sheet for Healthcare Providers: seriousbroker.it  This test is not yet approved or cleared by the [place]  FDA and has been authorized for detection and/or diagnosis of SARS-CoV-2 by FDA under an Emergency Use Authorization (EUA). This EUA will remain in effect (meaning this test can be used) for the duration of the COVID-19 declaration under Section 564(b)(1) of the Act, 21 U.S.C. section 360bbb-3(b)(1), unless the authorization is terminated or revoked.     Resp Syncytial Virus by PCR NEGATIVE NEGATIVE Final    Comment: (NOTE) Fact Sheet for Patients: bloggercourse.com  Fact Sheet for Healthcare Providers: seriousbroker.it  This test is not yet approved or cleared by the [place]   FDA and has been authorized for detection and/or diagnosis of SARS-CoV-2 by FDA under an Emergency Use Authorization (EUA). This EUA will remain in effect (meaning this test can be used) for the duration of the COVID-19 declaration under Section 564(b)(1) of the Act, 21 U.S.C. section 360bbb-3(b)(1), unless the authorization is terminated or revoked.  Performed at [facility], [address] [place], [place] [address]      Labs: CBC: Recent Labs  Lab 11/12/24 1202 11/13/24 0619  WBC 8.2 6.5  HGB 14.9 13.0  HCT 45.8 40.3  MCV 96.4 96.9  PLT 136* 110*   Basic Metabolic Panel: Recent Labs  Lab 11/12/24 1202 11/13/24 0618 11/13/24 0619  NA 137  --  137  K 3.8  --  3.4*  CL 99  --  100  CO2 25  --  27  GLUCOSE 105*  --  80  BUN 32*  --  29*  CREATININE 1.38*  --  1.22  CALCIUM 9.7  --  8.7*  MG  --  2.1  --   PHOS  --  2.4*  --    Liver Function Tests: Recent Labs  Lab 11/12/24 1202  AST 59*  ALT 38  ALKPHOS 82  BILITOT 0.7  PROT 8.1  ALBUMIN 4.2   No results for input(s): LIPASE, AMYLASE in the last 168 hours. No results for input(s): AMMONIA in the last 168 hours. Cardiac Enzymes: No results for input(s): CKTOTAL, CKMB, CKMBINDEX, TROPONINI in the last 168 hours. BNP (last 3 results) No results for input(s): BNP in the last 8760 hours. CBG: No results for input(s): GLUCAP in the last 168 hours.  Time spent: 35 minutes  Signed:  Elvan Sor  [facility]  11/13/2024 12:55 PM      [1]  Allergies Allergen Reactions   Naphazoline-Polyethyl Glycol Other (See Comments)    (Afgan) redness

## 2024-11-13 NOTE — Plan of Care (Signed)

## 2024-11-13 NOTE — Progress Notes (Signed)
 Patient discharged with wife to [facility]. Report called to nursing facility. All discharge needs met.

## 2024-11-13 NOTE — TOC PASRR Note (Signed)
 To whom it may concern: The above named patient requires a short term nursing home stay- anticipated 30 days or less- plan is for return home.

## 2024-11-13 NOTE — TOC Transition Note (Signed)
 Transition of Care [facility]) - Discharge Note   Patient Details  Name: Juan Horn MRN: [id number] Date of Birth: 1932/12/22  Transition of Care [facility]) CM/SW Contact:  [name], RN Phone Number: 11/13/2024, 2:17 PM   Clinical Narrative:     Patient is ready for discharge, he is going to [facility] room 107.  Bedside RN to call report to [phone].  Wife will be transporting him.    Final next level of care: Skilled Nursing Facility Barriers to Discharge: Barriers Resolved   Patient Goals and CMS Choice Patient states their goals for this hospitalization and ongoing recovery are:: To go to [facility] for [employer].gov Compare Post Acute Care list provided to:: Patient Choice offered to / list presented to : Patient      Discharge Placement PASRR number recieved: 11/13/24            Patient chooses bed at: [facility] Patient to be transferred to facility by: Wife Name of family member notified: Erminio Patient and family notified of of transfer: 11/13/24  Discharge Plan and Services Additional resources added to the After Visit Summary for     Discharge Planning Services: CM Consult Post Acute Care Choice: Skilled Nursing Facility          DME Arranged: N/A         HH Arranged: NA          Social Drivers of Health (SDOH) Interventions SDOH Screenings   Food Insecurity: No Food Insecurity (11/12/2024)  Housing: Low Risk (11/12/2024)  Transportation Needs: No Transportation Needs (11/12/2024)  Utilities: Not At Risk (11/12/2024)  Depression (PHQ2-9): Low Risk (10/14/2024)  Social Connections: Moderately Integrated (11/12/2024)  Tobacco Use: Medium Risk (11/12/2024)     Readmission Risk Interventions     No data to display

## 2024-11-13 NOTE — Evaluation (Signed)
 Physical Therapy Evaluation Patient Details Name: Emile Kyllo MRN: [id number] DOB: [birth year] Today's Date: 11/13/2024  History of Present Illness  Patient is a 89 year old male with cough, generalized weakness, fall, found to have Flu A. PMH: hyperlipidemia,dCHF, GERD, depression with anxiety, CKD 3A, remote DVT not on anticoagulants, right eye blindness, hard of hearing, skin cancer, prostate cancer.  Clinical Impression  Patient is agreeable to PT session. He lives in the [place] part of [address] community. He can usually pivot and ambulate short distance with his walker. He is primarily in the scooter in the community. Spouse reports 3 recent falls and the patient feels weaker that his baseline.  Today the patient required moderate assistance for standing. Standing balance is poor with anterior weight shifting assistance required. Generalized weakness throughout with endurance impaired for sustained activity in standing. Recommend rehabilitation < 3 hours/day after this hospital stay, and both patient and spouse are requesting SNF at [facility]. PT will continue to follow.       If plan is discharge home, recommend the following: A lot of help with walking and/or transfers;A lot of help with bathing/dressing/bathroom;Assistance with cooking/housework;Assist for transportation;Help with stairs or ramp for entrance   Can travel by private vehicle   No    Equipment Recommendations None recommended by PT  Recommendations for Other Services       Functional Status Assessment Patient has had a recent decline in their functional status and demonstrates the ability to make significant improvements in function in a reasonable and predictable amount of time.     Precautions / Restrictions Precautions Precautions: Fall Recall of Precautions/Restrictions: Intact Restrictions Weight Bearing Restrictions Per Provider Order: No      Mobility  Bed Mobility                General bed mobility comments: not assessed as patient sitting up on arrival and post session    Transfers Overall transfer level: Needs assistance Equipment used: Rolling walker (2 wheels) Transfers: Sit to/from Stand Sit to Stand: Mod assist           General transfer comment: lifting and lowering assistance provided. cues for technique    Ambulation/Gait             Pre-gait activities: faciliation for anterior weight shifting. RW for UE support with external support required. standing tolerance around 30 seconds General Gait Details: not attempted due to feeling unbalanced and generalized weakness with standing  Stairs            Wheelchair Mobility     Tilt Bed    Modified Rankin (Stroke Patients Only)       Balance Overall balance assessment: Needs assistance Sitting-balance support: Feet supported Sitting balance-Leahy Scale: Fair     Standing balance support: Bilateral upper extremity supported Standing balance-Leahy Scale: Poor Standing balance comment: external support required                             Pertinent Vitals/Pain Pain Assessment Pain Assessment: No/denies pain    Home Living Family/patient expects to be discharged to:: Other (Comment) ([facility]F [facility])     Type of Home: House Home Access: Ramped entrance       Home Layout: One level Home Equipment: Rollator (4 wheels);Electric scooter;Lift chair      Prior Function Prior Level of Function : History of Falls (last six months);Independent/Modified Independent  Mobility Comments: can walk short distance with rollator. uses scooter for primary means of mobility in the community. has had 3 recent falls with EMS called to assist off the floor ADLs Comments: typically independent/Mod I. spouse reports she assist interimttently as needed     Extremity/Trunk Assessment   Upper Extremity Assessment Upper Extremity Assessment: Generalized  weakness    Lower Extremity Assessment Lower Extremity Assessment: Generalized weakness (endurance impaired for sustained activity.)       Communication   Communication Communication: Impaired Factors Affecting Communication: Hearing impaired    Cognition Arousal: Alert Behavior During Therapy: WFL for tasks assessed/performed   PT - Cognitive impairments: No apparent impairments                         Following commands: Impaired Following commands impaired: Follows one step commands with increased time     Cueing Cueing Techniques: Verbal cues, Visual cues     General Comments      Exercises     Assessment/Plan    PT Assessment Patient needs continued PT services  PT Problem List Decreased strength;Decreased range of motion;Decreased activity tolerance;Decreased balance;Decreased mobility;Decreased cognition;Decreased knowledge of use of DME       PT Treatment Interventions DME instruction;Gait training;Stair training;Functional mobility training;Therapeutic activities;Therapeutic exercise;Balance training;Neuromuscular re-education;Cognitive remediation    PT Goals (Current goals can be found in the Care Plan section)  Acute Rehab PT Goals Patient Stated Goal: to get rehab before going home PT Goal Formulation: With patient/family Time For Goal Achievement: 11/27/24 Potential to Achieve Goals: Fair    Frequency Min 2X/week     Co-evaluation               AM-PAC PT 6 Clicks Mobility  Outcome Measure Help needed turning from your back to your side while in a flat bed without using bedrails?: A Little Help needed moving from lying on your back to sitting on the side of a flat bed without using bedrails?: A Lot Help needed moving to and from a bed to a chair (including a wheelchair)?: A Lot Help needed standing up from a chair using your arms (e.g., wheelchair or bedside chair)?: A Lot Help needed to walk in hospital room?: Total Help  needed climbing 3-5 steps with a railing? : Total 6 Click Score: 11    End of Session   Activity Tolerance: Patient tolerated treatment well;Patient limited by fatigue Patient left: in chair;with call bell/phone within reach Nurse Communication: Mobility status PT Visit Diagnosis: Muscle weakness (generalized) (M62.81);Unsteadiness on feet (R26.81)    Time: 9062-9046 PT Time Calculation (min) (ACUTE ONLY): 16 min   Charges:   PT Evaluation $PT Eval Moderate Complexity: 1 Mod   PT General Charges $$ ACUTE PT VISIT: 1 Visit        Randine Essex, PT, MPT   Randine LULLA Essex 11/13/2024, 10:15 AM

## 2024-11-13 NOTE — Evaluation (Signed)
 Occupational Therapy Evaluation Patient Details Name: Juan Horn MRN: [id number] DOB: March 04, 1933 Today's Date: 11/13/2024   History of Present Illness   Patient is a 89 year old male with cough, generalized weakness, fall, found to have Flu A. PMH: hyperlipidemia,dCHF, GERD, depression with anxiety, CKD 3A, remote DVT not on anticoagulants, right eye blindness, hard of hearing, skin cancer, prostate cancer.     Clinical Impressions Patient was seen for OT evaluation this date. Prior to hospital admission, patient was able to manage basic ADLs with intermittent supervision from spouse but mostly mod I. Patient lives in ILF with spouse who is able to provide support. Patient has been hospitalized due to weakness/falls and dx with influenza A. Patient receptive to education for energy conservation techniques and fall prevention strategies to implement into ADL routine, patient with good return demo of doffing/donning socks while sitting in figure 4 with increased time/effort. Patient performed sit<>stand with mod A from recliner with cues for hand placement, body mechanics and lifting/lowering A. Patient reports fatigue with minimal mobility and is functioning below baseline. Patient would benefit from skilled OT services to address noted impairments and functional limitations (see below for any additional details) in order to maximize safety and independence while minimizing future risk of falls, injury, and readmission. Anticipate the need for follow up OT services upon acute hospital DC.      If plan is discharge home, recommend the following:   A lot of help with walking and/or transfers;A little help with bathing/dressing/bathroom;Help with stairs or ramp for entrance     Functional Status Assessment   Patient has had a recent decline in their functional status and demonstrates the ability to make significant improvements in function in a reasonable and predictable amount of  time.     Equipment Recommendations   Other (comment) (defer to next venue of care)     Recommendations for Other Services         Precautions/Restrictions   Precautions Precautions: Fall Recall of Precautions/Restrictions: Intact Restrictions Weight Bearing Restrictions Per Provider Order: No     Mobility Bed Mobility               General bed mobility comments: not assessed, patient out of bed pre/post tx    Transfers Overall transfer level: Needs assistance Equipment used: Rolling walker (2 wheels) Transfers: Sit to/from Stand Sit to Stand: Mod assist           General transfer comment: lifting and lowering assistance provided. cues for technique      Balance Overall balance assessment: Needs assistance Sitting-balance support: Feet supported Sitting balance-Leahy Scale: Fair     Standing balance support: Bilateral upper extremity supported Standing balance-Leahy Scale: Poor Standing balance comment: external support required                           ADL either performed or assessed with clinical judgement   ADL Overall ADL's : Needs assistance/impaired Eating/Feeding: Set up   Grooming: Wash/dry face;Sitting;Set up   Upper Body Bathing: Supervision/ safety;Sitting   Lower Body Bathing: Minimal assistance;Sit to/from stand   Upper Body Dressing : Supervision/safety;Sitting   Lower Body Dressing: Moderate assistance;Sit to/from stand   Toilet Transfer: Moderate assistance;BSC/3in1   Toileting- Clothing Manipulation and Hygiene: Moderate assistance;Sit to/from stand         General ADL Comments: able to reach B distal LE with increased effort, A for all tasks but patient able to participate  Vision         Perception         Praxis         Pertinent Vitals/Pain Pain Assessment Pain Assessment: No/denies pain     Extremity/Trunk Assessment Upper Extremity Assessment Upper Extremity Assessment:  Generalized weakness   Lower Extremity Assessment Lower Extremity Assessment: Generalized weakness       Communication Communication Communication: Impaired Factors Affecting Communication: Hearing impaired   Cognition Arousal: Alert Behavior During Therapy: WFL for tasks assessed/performed Cognition: No apparent impairments                               Following commands: Impaired Following commands impaired: Follows one step commands with increased time     Cueing  General Comments   Cueing Techniques: Verbal cues;Visual cues      Exercises     Shoulder Instructions      Home Living Family/patient expects to be discharged to:: Other (Comment) (ILF)     Type of Home: House Home Access: Ramped entrance     Home Layout: One level               Home Equipment: Rollator (4 wheels);Electric scooter;Lift chair          Prior Functioning/Environment Prior Level of Function : History of Falls (last six months);Independent/Modified Independent             Mobility Comments: can walk short distance with rollator. uses scooter for primary means of mobility in the community. has had 3 recent falls with EMS called to assist off the floor ADLs Comments: typically independent/Mod I. spouse reports she assist interimttently as needed    OT Problem List: Decreased strength;Decreased activity tolerance;Impaired balance (sitting and/or standing);Decreased safety awareness   OT Treatment/Interventions: Self-care/ADL training;Therapeutic exercise;Energy conservation      OT Goals(Current goals can be found in the care plan section)   Acute Rehab OT Goals Patient Stated Goal: to get stronger OT Goal Formulation: With patient/family Time For Goal Achievement: 11/27/24 Potential to Achieve Goals: Good ADL Goals Pt Will Perform Grooming: with supervision;standing Pt Will Perform Lower Body Dressing: with contact guard assist;sit to/from stand Pt  Will Transfer to Toilet: with supervision;with contact guard assist;ambulating;bedside commode Pt Will Perform Toileting - Clothing Manipulation and hygiene: with contact guard assist;sit to/from stand   OT Frequency:  Min 2X/week    Co-evaluation              AM-PAC OT 6 Clicks Daily Activity     Outcome Measure Help from another person eating meals?: None Help from another person taking care of personal grooming?: None Help from another person toileting, which includes using toliet, bedpan, or urinal?: A Little Help from another person bathing (including washing, rinsing, drying)?: A Little Help from another person to put on and taking off regular upper body clothing?: A Little Help from another person to put on and taking off regular lower body clothing?: A Lot 6 Click Score: 19   End of Session Equipment Utilized During Treatment: Rolling walker (2 wheels);Gait belt  Activity Tolerance: Patient tolerated treatment well Patient left: in chair;with call bell/phone within reach;with chair alarm set;with nursing/sitter in room  OT Visit Diagnosis: Unsteadiness on feet (R26.81);Repeated falls (R29.6);Muscle weakness (generalized) (M62.81);History of falling (Z91.81)                Time: 8961-8897 OT Time Calculation (min): 24 min Charges:  OT General Charges $OT Visit: 1 Visit OT Evaluation $OT Eval Low Complexity: 1 Low OT Treatments $Self Care/Home Management : 8-22 mins  Rogers Clause, OT/L MSOT, 11/13/2024

## 2024-11-13 NOTE — Care Management Obs Status (Signed)
 MEDICARE OBSERVATION STATUS NOTIFICATION   Patient Details  Name: Juan Horn MRN: [id number] Date of Birth: 07/11/1933   Medicare Observation Status Notification Given:  Yes    Divon Krabill W, CMA 11/13/2024, 11:42 AM

## 2024-11-13 NOTE — Plan of Care (Signed)
  Problem: Health Behavior/Discharge Planning: Goal: Ability to manage health-related needs will improve Outcome: Progressing   Problem: Clinical Measurements: Goal: Will remain free from infection Outcome: Progressing Goal: Diagnostic test results will improve Outcome: Progressing Goal: Respiratory complications will improve Outcome: Progressing   

## 2024-11-16 ENCOUNTER — Non-Acute Institutional Stay (SKILLED_NURSING_FACILITY): Payer: Self-pay | Admitting: Orthopedic Surgery

## 2024-11-16 ENCOUNTER — Encounter: Payer: Self-pay | Admitting: Orthopedic Surgery

## 2024-11-16 DIAGNOSIS — J101 Influenza due to other identified influenza virus with other respiratory manifestations: Secondary | ICD-10-CM | POA: Diagnosis not present

## 2024-11-16 DIAGNOSIS — N1831 Chronic kidney disease, stage 3a: Secondary | ICD-10-CM

## 2024-11-16 DIAGNOSIS — I5032 Chronic diastolic (congestive) heart failure: Secondary | ICD-10-CM

## 2024-11-16 DIAGNOSIS — R413 Other amnesia: Secondary | ICD-10-CM

## 2024-11-16 DIAGNOSIS — R296 Repeated falls: Secondary | ICD-10-CM | POA: Diagnosis not present

## 2024-11-16 LAB — COMPREHENSIVE METABOLIC PANEL WITH GFR
Calcium: 8.4 — AB (ref 8.7–10.7)
eGFR: 64

## 2024-11-16 LAB — BASIC METABOLIC PANEL WITH GFR
BUN: 19 (ref 4–21)
CO2: 31 — AB (ref 13–22)
Chloride: 103 (ref 99–108)
Creatinine: 1.1 (ref 0.6–1.3)
Glucose: 93
Potassium: 3.8 meq/L (ref 3.5–5.1)
Sodium: 140 (ref 137–147)

## 2024-11-16 LAB — CBC AND DIFFERENTIAL
HCT: 39 — AB (ref 41–53)
Hemoglobin: 13.3 — AB (ref 13.5–17.5)
Neutrophils Absolute: 3642
Platelets: 125 K/uL — AB (ref 150–400)
WBC: 5.8

## 2024-11-16 LAB — CBC: RBC: 4.11 (ref 3.87–5.11)

## 2024-11-16 NOTE — Progress Notes (Unsigned)
 Location:  Other [facility].  Nursing Home Room Number: [facility] [id number] Place of Service:  SNF (31) Provider:  Gil Greig BRAVO, NP  Patient Care Team: Gil Greig BRAVO, NP as PCP - General (Adult Health Nurse Practitioner)  Extended Emergency Contact Information Primary Emergency Contact: Hawthorn,Brenda Mobile Phone: [phone] Relation: Niece Secondary Emergency Contact: Swopes,Carole Address: [address] [place], [place] [address] [place] Home Phone: [phone] Mobile Phone: [phone] Relation: Spouse  Code Status:  DNR Goals of care: Advanced Directive information    11/12/2024   10:37 PM  Advanced Directives  Type of Advance Directive Living will  Does patient want to make changes to medical advance directive? No - Patient declined     Chief Complaint  Patient presents with   Hospitalization Follow-up    Hospital Follow up    HPI:  Pt is a 89 y.o. male seen today for f/u s/p hospitalization 12/11-12/12 due to weakness.   He currently resides on the rehab unit at [facility]. PMH: CHF, chronic venous insufficiency, diverticulosis, right eye blindness, prostate cancer, GERD, DDD, CKD, h/o DVT, anxiety and depression.   He had increased weakness with 3 falls prior to hospitalization. In the ED, he was found to he positive for influenza A. CXR negative for infiltrate. Cbc/diff unremarkable. He was started on reduced dose Tamiflu  due to CKD. He did report hitting his head with one of his falls. CT head/cervical spine negative for intracranial abnormality, no acute abnormality of spine. PT/OT recommended SNF.   Today, he is upset he is in rehab. He wants to go home. He is a poor historian with recent events. Continues to have nasal congestion and cough, denies fever, chest pain, shortness of breath, fatigue, sore throat. Not using oxygen. He has not started PT/OT, planned for today. Afebrile. Vitals stable.   Past Medical History:   Diagnosis Date   Actinic keratosis    Anxiety    BCC (basal cell carcinoma of skin) 07/24/2021   Left upper forehead, EDC   Cancer (HCC) 2010   Prostate Cancer   Cataract    left eye   Chronic renal disease, stage III (HCC)    Chronic venous insufficiency    Clotting disorder 2013   blood clot 3 days post knee surgery   Diverticulosis of colon    DVT (deep venous thrombosis) (HCC) 2012   after knee replacement   Glaucoma    HLD (hyperlipidemia)    Hx of basal cell carcinoma 12/23/2018   L nasal tip   Hx of basal cell carcinoma 12/23/2018   R preauricular   Hx of colonic polyp    Macular degeneration    legally blind in right eye   OA (osteoarthritis)    Personal history of prostate cancer    Pulmonary embolism (HCC) 10/2012   post op TKR   PVC (premature ventricular contraction)    Spinal stenosis of lumbar region    Squamous cell carcinoma of skin 05/06/2018   L dorsal forearm near anticubital   Past Surgical History:  Procedure Laterality Date   APPENDECTOMY     CATARACT EXTRACTION W/PHACO Left 06/24/2019   Procedure: CATARACT EXTRACTION PHACO AND INTRAOCULAR LENS PLACEMENT (IOC) LEFT;  Surgeon: Mittie Gaskin, MD;  Location: [facility];  Service: Ophthalmology;  Laterality: Left;   COLONOSCOPY  2011   INGUINAL HERNIA REPAIR     left   INGUINAL HERNIA REPAIR  5/12  Dr Jesslyn   INGUINAL HERNIA REPAIR  5/12   Dr Dellie did redo of this   JOINT REPLACEMENT  11/13   Left total knee--Dr [facility]urg[facility]   KNEE SURGERY  2013   POLYPECTOMY  2011   PROSTATECTOMY  2010   TONSILLECTOMY     VARICOSE VEIN SURGERY     left    Allergies[1]  Outpatient Encounter Medications as of 11/16/2024  Medication Sig   acetaminophen  (TYLENOL ) 500 MG tablet Take 1,000 mg by mouth daily as needed for headache or mild pain (pain score 1-3).   ascorbic acid  (VITAMIN C ) 500 MG tablet Take 500 mg by mouth daily.   Cholecalciferol  (VITAMIN D3 PO) Take 1,000 Units by mouth  daily.   dextromethorphan -guaiFENesin  (MUCINEX  DM) 30-600 MG 12hr tablet Take 1 tablet by mouth 2 (two) times daily as needed for cough.   erythromycin  ophthalmic ointment Place 1 Application into both eyes at bedtime.   hydroxypropyl methylcellulose / hypromellose (ISOPTO TEARS / GONIOVISC) 2.5 % ophthalmic solution 1 drop as needed for dry eyes.   iron  polysaccharides (NIFEREX) 150 MG capsule Take 1 capsule (150 mg total) by mouth daily.   latanoprost  (XALATAN ) 0.005 % ophthalmic solution Place 1 drop into both eyes at bedtime.   Multiple Vitamins-Minerals (PRESERVISION AREDS 2 PO) Take 2 tablets by mouth daily.    neomycin-polymyxin b -dexamethasone  (MAXITROL) 3.5-10000-0.1 SUSP Place 1 drop into the left eye 3 (three) times daily.   oseltamivir  (TAMIFLU ) 30 MG capsule Take 1 capsule (30 mg total) by mouth 2 (two) times daily for 8 doses.   pravastatin  (PRAVACHOL ) 20 MG tablet Take 1 tablet (20 mg total) by mouth daily.   sertraline  (ZOLOFT ) 100 MG tablet Take 1 tablet (100 mg total) by mouth daily.   Wound Dressings (MEPILEX) PADS Apply 1 Application topically every 3 (three) days.   Ascorbic Acid  (VITAMIN C  PO) Take by mouth daily. (Patient not taking: Reported on 11/16/2024)   hepatitis B vacrRecombinant (RECOMBIVAX HB) 5 MCG/0.5ML SUSY injection Inject 1 mL into the muscle as directed. (Patient not taking: Reported on 11/16/2024)   [DISCONTINUED] vitamin C  (ASCORBIC ACID ) 250 MG tablet Take 1 tablet (250 mg total) by mouth daily.   No facility-administered encounter medications on file as of 11/16/2024.    Review of Systems  Constitutional:  Negative for fatigue and fever.  HENT:  Positive for congestion. Negative for sore throat and trouble swallowing.   Eyes:  Negative for visual disturbance.  Respiratory:  Positive for cough and wheezing. Negative for shortness of breath.   Gastrointestinal:  Negative for abdominal distention and abdominal pain.  Genitourinary:  Negative for dysuria  and hematuria.  Musculoskeletal:  Positive for gait problem.  Skin:  Negative for wound.  Neurological:  Positive for weakness. Negative for dizziness and headaches.  Psychiatric/Behavioral:  Positive for confusion and dysphoric mood. Negative for sleep disturbance. The patient is not nervous/anxious.     Immunization History  Administered Date(s) Administered   Hepatitis A, Adult 08/17/2003   Hepatitis B, ADULT 02/15/2004   INFLUENZA, HIGH DOSE SEASONAL PF 09/06/2016, 09/18/2022, 09/25/2024   Influenza Split 09/17/2011, 09/10/2012   Influenza Whole 10/03/2010   Influenza, Seasonal, Injecte, Preservative Fre 10/03/2015   Influenza,inj,Quad PF,6+ Mos 08/13/2017   Influenza-Unspecified 09/02/2014, 09/16/2018, 09/15/2019, 09/02/2020, 09/26/2023   Moderna Covid-19 Fall Seasonal Vaccine 86yrs & older 03/12/2023   Moderna Covid-19 Vaccine  Bivalent Booster 35yrs & up 08/24/2021, 05/01/2022, 10/12/2022   Moderna Sars-Covid-2 Vaccination 12/15/2019, 01/12/2020, 02/19/2020, 10/18/2020, 04/18/2021   OPV 08/17/2003  Pneumococcal Conjugate-13 06/09/2014   Pneumococcal Polysaccharide-23 10/30/2005, 05/06/2018   Td 10/24/2010   Tdap 10/06/2022   Typhoid Live 08/17/2003   Unspecified SARS-COV-2 Vaccination 08/30/2023, 03/13/2024, 09/25/2024   Zoster Recombinant(Shingrix) 07/23/2019, 10/09/2019   Zoster, Live 10/03/2010   Pertinent  Health Maintenance Due  Topic Date Due   Influenza Vaccine  Completed      05/06/2024    2:04 PM 06/30/2024   10:09 AM 07/08/2024    1:28 PM 10/14/2024    1:20 PM 10/14/2024    2:07 PM  Fall Risk  Falls in the past year? 1 1 1 1 1   Was there an injury with Fall? 1  1  1  1  1    Fall Risk Category Calculator 3 3 3 3 3   Patient at Risk for Falls Due to Impaired balance/gait;Impaired mobility Impaired balance/gait;Impaired mobility Impaired balance/gait;Impaired mobility History of fall(s) History of fall(s);Impaired balance/gait  Fall risk Follow up Falls  evaluation completed Falls evaluation completed Falls evaluation completed Falls evaluation completed Falls evaluation completed     Data saved with a previous flowsheet row definition   Functional Status Survey:    Vitals:   11/16/24 0952  BP: 130/60  Pulse: 74  Resp: 20  Temp: (!) 97.4 F (36.3 C)  SpO2: 95%  Weight: 192 lb 9.6 oz (87.4 kg)  Height: 5' 7 (1.702 m)   Body mass index is 30.17 kg/m. Physical Exam Vitals reviewed.  Constitutional:      General: He is not in acute distress. HENT:     Head: Normocephalic and atraumatic.  Eyes:     General:        Right eye: No discharge.        Left eye: No discharge.  Cardiovascular:     Rate and Rhythm: Normal rate and regular rhythm.     Pulses: Normal pulses.     Heart sounds: Normal heart sounds.  Pulmonary:     Effort: Pulmonary effort is normal.     Breath sounds: Normal breath sounds.  Abdominal:     General: Bowel sounds are normal.     Palpations: Abdomen is soft.  Musculoskeletal:     Cervical back: Neck supple.     Right lower leg: No edema.     Left lower leg: No edema.  Skin:    General: Skin is warm.     Capillary Refill: Capillary refill takes less than 2 seconds.  Neurological:     General: No focal deficit present.     Mental Status: He is alert and oriented to person, place, and time.     Motor: Weakness present.     Gait: Gait abnormal.  Psychiatric:     Comments: Mildly agitated, repetitive phrases, poor historian of recent hospitalization     Labs reviewed: Recent Labs    03/16/24 0753 04/09/24 0825 08/10/24 0808 11/12/24 1202 11/13/24 0618 11/13/24 0619  NA 141   < > 142 137  --  137  K 4.0   < > 4.1 3.8  --  3.4*  CL 106   < > 103 99  --  100  CO2 30   < > 30 25  --  27  GLUCOSE 106*   < > 105* 105*  --  80  BUN 34*   < > 31* 32*  --  29*  CREATININE 1.28*   < > 1.15 1.38*  --  1.22  CALCIUM 9.1  9.2   < > 9.2  9.2 9.7  --  8.7*  MG  --   --   --   --  2.1  --   PHOS  3.7  --  3.5  --  2.4*  --    < > = values in this interval not displayed.   Recent Labs    03/16/24 0753 08/10/24 0808 11/12/24 1202  AST 17 21 59*  ALT 25 36 38  ALKPHOS  --   --  82  BILITOT 0.4 0.5 0.7  PROT 7.5 7.2 8.1  ALBUMIN  --   --  4.2   Recent Labs    12/30/23 0758 03/16/24 0753 04/09/24 0825 11/12/24 1202 11/13/24 0619  WBC 7.7 6.7 6.5 8.2 6.5  NEUTROABS 4,951 3,899 4,238  --   --   HGB 14.0 11.9* 12.3* 14.9 13.0  HCT 44.4 37.7* 38.5 45.8 40.3  MCV 86.2 86.9 86.1 96.4 96.9  PLT 196 206 199 136* 110*   Lab Results  Component Value Date   TSH 2.77 12/30/2023   No results found for: HGBA1C Lab Results  Component Value Date   CHOL 187 12/30/2023   HDL 52 12/30/2023   LDLCALC 112 (H) 12/30/2023   LDLDIRECT 113.0 05/29/2022   TRIG 122 12/30/2023   CHOLHDL 3.6 12/30/2023    Significant Diagnostic Results in last 30 days:  CT Cervical Spine Wo Contrast Result Date: 11/12/2024 EXAM: CT CERVICAL SPINE WITHOUT CONTRAST 11/12/2024 04:47:59 PM TECHNIQUE: CT of the cervical spine was performed without the administration of intravenous contrast. Multiplanar reformatted images are provided for review. Automated exposure control, iterative reconstruction, and/or weight based adjustment of the mA/kV was utilized to reduce the radiation dose to as low as reasonably achievable. COMPARISON: None available. CLINICAL HISTORY: Neck trauma (Age >= 65y) FINDINGS: CERVICAL SPINE: BONES AND ALIGNMENT: No acute fracture or traumatic malalignment. DEGENERATIVE CHANGES: Mild degenerative changes, most prominent at C5-C6. SOFT TISSUES: No prevertebral soft tissue swelling. IMPRESSION: 1. No acute abnormality of the cervical spine. Electronically signed by: Pinkie Pebbles MD 11/12/2024 05:16 PM EST RP Workstation: [id number]   CT Head Wo Contrast Result Date: 11/12/2024 EXAM: CT HEAD WITHOUT 11/12/2024 04:47:59 PM TECHNIQUE: CT of the head was performed without the administration  of intravenous contrast. Automated exposure control, iterative reconstruction, and/or weight based adjustment of the mA/kV was utilized to reduce the radiation dose to as low as reasonably achievable. COMPARISON: 09/08/2024 CLINICAL HISTORY: Head trauma, minor (Age >= 65y) FINDINGS: BRAIN AND VENTRICLES: No acute intracranial hemorrhage. No mass effect or midline shift. No extra-axial fluid collection. No evidence of acute infarct. No hydrocephalus. Global cortical atrophy. Subcortical and periventricular small vessel ischemic changes. Intracranial atherosclerosis. ORBITS: No acute abnormality. SINUSES AND MASTOIDS: Partial opacification of the right frontal and ethmoid sinuses, chronic. SOFT TISSUES AND SKULL: No acute skull fracture. No acute soft tissue abnormality. IMPRESSION: 1. No acute intracranial abnormality. Electronically signed by: Pinkie Pebbles MD 11/12/2024 05:16 PM EST RP Workstation: [id number]   DG Chest 2 View Result Date: 11/12/2024 EXAM: 2 VIEW(S) XRAY OF THE CHEST 11/12/2024 04:36:00 PM COMPARISON: 03/25/2017 CLINICAL HISTORY: Cough, weakness FINDINGS: LUNGS AND PLEURA: Elevated right hemidiaphragm is noted. Stable minimal right basilar scarring is noted. No pleural effusion. No pneumothorax. HEART AND MEDIASTINUM: No acute abnormality of the cardiac and mediastinal silhouettes. BONES AND SOFT TISSUES: No acute osseous abnormality. IMPRESSION: 1. Stable elevated left hemidiaphragm. 2. Stable minimal right basilar scarring. Electronically signed by: Lynwood Seip MD 11/12/2024 04:59 PM EST RP Workstation: [id number]  Assessment/Plan 1. Influenza A (Primary) - 12/11 tested positive - cont low dose Tamiflu  - wheezing on exam - start duonebs BID x 3 days - cont guaifenesin   2. Frequent falls - cont PT/OT  3. Memory loss - poor historian, repetitive phrases - no baseline MMSE  - CT head noted global cortical atrophy and periventricular small ischemic changes - ST consult for  cognitive testing   4. Chronic diastolic CHF (congestive heart failure) (HCC) - BNP 524 - LVEF 55%, grade I DD - diuresis not performed - not on medication  5. Stage 3a chronic kidney disease (HCC) - GFR 57 (12/12)> 47 (12/11)  - encourage hydration with water - avoid NSAIDS    Family/ staff Communication: plan discussed with patient and nurse  Labs/tests ordered:  ST evaluation         [1]  Allergies Allergen Reactions   Naphazoline-Polyethyl Glycol Other (See Comments)    (Afgan) redness

## 2024-11-17 MED ORDER — IPRATROPIUM-ALBUTEROL 0.5-2.5 (3) MG/3ML IN SOLN: 3.0000 mL | Freq: Two times a day (BID) | RESPIRATORY_TRACT | Status: DC

## 2024-11-18 ENCOUNTER — Encounter: Payer: Self-pay | Admitting: Internal Medicine

## 2024-11-18 ENCOUNTER — Non-Acute Institutional Stay (SKILLED_NURSING_FACILITY): Payer: Self-pay | Admitting: Internal Medicine

## 2024-11-18 DIAGNOSIS — K219 Gastro-esophageal reflux disease without esophagitis: Secondary | ICD-10-CM

## 2024-11-18 DIAGNOSIS — F321 Major depressive disorder, single episode, moderate: Secondary | ICD-10-CM

## 2024-11-18 DIAGNOSIS — E782 Mixed hyperlipidemia: Secondary | ICD-10-CM

## 2024-11-18 DIAGNOSIS — J101 Influenza due to other identified influenza virus with other respiratory manifestations: Secondary | ICD-10-CM

## 2024-11-18 DIAGNOSIS — N1831 Chronic kidney disease, stage 3a: Secondary | ICD-10-CM | POA: Diagnosis not present

## 2024-11-18 DIAGNOSIS — I5032 Chronic diastolic (congestive) heart failure: Secondary | ICD-10-CM

## 2024-11-18 DIAGNOSIS — F3342 Major depressive disorder, recurrent, in full remission: Secondary | ICD-10-CM | POA: Diagnosis not present

## 2024-11-18 DIAGNOSIS — Z66 Do not resuscitate: Secondary | ICD-10-CM

## 2024-11-18 DIAGNOSIS — R5381 Other malaise: Secondary | ICD-10-CM

## 2024-11-18 NOTE — Assessment & Plan Note (Signed)
 Stable.  He is not on any guideline directed medical therapy.

## 2024-11-18 NOTE — Assessment & Plan Note (Signed)
 He is recovering from his influenza infection.  He is still coughing somewhat.  No need for steroids at this time

## 2024-11-18 NOTE — Progress Notes (Signed)
 [facility] SNF Admission H&P  Provider: Camellia Door, DO Location:  Other [place].  Nursing Home Room Number: [facility] [id number] Place of Service:  SNF (31)   PCP: Gil Greig BRAVO, NP Patient Care Team: Gil Greig BRAVO, NP as PCP - General (Adult Health Nurse Practitioner)   Extended Emergency Contact Information Primary Emergency Contact: Hawthorn,Brenda Mobile Phone: [phone] Relation: Niece Secondary Emergency Contact: Maggard,Carole Address: [address] [place], [place] [address] [place] Home Phone: [phone] Mobile Phone: [phone] Relation: Spouse   Goals of Care: Advanced Directive information    11/12/2024   10:37 PM  Advanced Directives  Type of Advance Directive Living will  Does patient want to make changes to medical advance directive? No - Patient declined    CODE STATUS: Do Not Resuscitate (DNR)    Chief Complaint  Patient presents with   Admission    Admission     HPI: Patient is a 89 y.o. male seen today for admission to [facility].  89 year old with a history of prior DVT, CKD stage IIIa, major depression, glaucoma, hyperlipidemia who was admitted to [facility] on November 13, 2024 after he was admitted overnight due to weakness.  He was admitted on November 12, 2024.  He had been feeling weak with some swelling in in his feet and was to the ER for evaluation.  He was positive for influenza A.  His workup was negative for pneumonia.  He still was too weak to go home to independent living and he was subsequently admitted to skilled nursing for   Further rehab  .  Incidentally, the patient's wife contracted COVID about 4 days ago and is currently recuperating at home.    Patient is still quite weak.  He is not walking independently yet.    He is still coughing quite a bit but is on room air.   This is a comprehensive admission note to this SNF performed on this date less than 30 days from date  of admission. Included are preadmission medical/surgical history; reconciled medication list; family history; social history and comprehensive review of systems.  Corrections and additions to the records were documented. Comprehensive physical exam was also performed. Additionally a clinical summary was entered for each active diagnosis pertinent to this admission in the Problem List to enhance continuity of care.   Past Medical History:  Diagnosis Date   Actinic keratosis    Anxiety    BCC (basal cell carcinoma of skin) 07/24/2021   Left upper forehead, EDC   Cancer (HCC) 2010   Prostate Cancer   Cataract    left eye   Chronic renal disease, stage III (HCC)    Chronic venous insufficiency    Clotting disorder 2013   blood clot 3 days post knee surgery   Diverticulosis of colon    DVT (deep venous thrombosis) (HCC) 2012   after knee replacement   Glaucoma    History of DVT (deep vein thrombosis) 01/22/2017   Formatting of this note might be different from the original.  Peri knee replacement in 2013     HLD (hyperlipidemia)    Hx of basal cell carcinoma 12/23/2018   L nasal tip   Hx of basal cell carcinoma 12/23/2018   R preauricular   Hx of colonic polyp    Macular degeneration    legally blind in right eye   OA (osteoarthritis)  Personal history of prostate cancer    Pulmonary embolism (HCC) 10/2012   post op TKR   PVC (premature ventricular contraction)    Spinal stenosis of lumbar region    Squamous cell carcinoma of skin 05/06/2018   L dorsal forearm near anticubital   Past Surgical History:  Procedure Laterality Date   APPENDECTOMY     CATARACT EXTRACTION W/PHACO Left 06/24/2019   Procedure: CATARACT EXTRACTION PHACO AND INTRAOCULAR LENS PLACEMENT (IOC) LEFT;  Surgeon: Mittie Gaskin, MD;  Location: [facility];  Service: Ophthalmology;  Laterality: Left;   COLONOSCOPY  2011   INGUINAL HERNIA REPAIR     left   INGUINAL HERNIA REPAIR  5/12   Dr  Jesslyn   INGUINAL HERNIA REPAIR  5/12   Dr Dellie did redo of this   JOINT REPLACEMENT  11/13   Left total knee--Dr [facility] [address]   KNEE SURGERY  2013   POLYPECTOMY  2011   PROSTATECTOMY  2010   TONSILLECTOMY     VARICOSE VEIN SURGERY     left    reports that he quit smoking about 40 years ago. His smoking use included cigarettes. He has never used smokeless tobacco. He reports current alcohol  use of about 7.0 standard drinks of alcohol  per week. He reports that he does not use drugs. Social History   Socioeconomic History   Marital status: Married    Spouse name: Not on file   Number of children: 0   Years of education: Not on file   Highest education level: Not on file  Occupational History   Occupation: Retired-purchasing for [employer]  Tobacco Use   Smoking status: Former    Current packs/day: 0.00    Types: Cigarettes    Quit date: 12/04/1983    Years since quitting: 40.9   Smokeless tobacco: Never  Vaping Use   Vaping status: Never Used  Substance and Sexual Activity   Alcohol  use: Yes    Alcohol /week: 7.0 standard drinks of alcohol     Types: 7 Glasses of wine per week   Drug use: No   Sexual activity: Not on file  Other Topics Concern   Not on file  Social History Narrative   Has living will   DNR done 11/12   Wife is health care POA--then niece Erminio Hoehn or niece Darice Donalds   No feeding tube if cognitively unaware   Social Drivers of Health   Tobacco Use: Medium Risk (11/18/2024)   Patient History    Smoking Tobacco Use: Former    Smokeless Tobacco Use: Never    Passive Exposure: Not on Actuary Strain: Not on file  Food Insecurity: No Food Insecurity (11/12/2024)   Epic    Worried About Programme Researcher, Broadcasting/film/video in the Last Year: Never true    Ran Out of Food in the Last Year: Never true  Transportation Needs: No Transportation Needs (11/12/2024)   Epic    Lack of Transportation (Medical): No    Lack of Transportation  (Non-Medical): No  Physical Activity: Not on file  Stress: Not on file  Social Connections: Moderately Integrated (11/12/2024)   Social Connection and Isolation Panel    Frequency of Communication with Friends and Family: Twice a week    Frequency of Social Gatherings with Friends and Family: Three times a week    Attends Religious Services: Never    Active Member of Clubs or Organizations: No    Attends Banker Meetings: 1 to 4  times per year    Marital Status: Married  Catering Manager Violence: Not At Risk (11/12/2024)   Epic    Fear of Current or Ex-Partner: No    Emotionally Abused: No    Physically Abused: No    Sexually Abused: No  Depression (PHQ2-9): Low Risk (11/17/2024)   Depression (PHQ2-9)    PHQ-2 Score: 0  Alcohol  Screen: Not on file  Housing: Low Risk (11/12/2024)   Epic    Unable to Pay for Housing in the Last Year: No    Number of Times Moved in the Last Year: 0    Homeless in the Last Year: No  Utilities: Not At Risk (11/12/2024)   Epic    Threatened with loss of utilities: No  Health Literacy: Not on file     Functional Status Survey:     Family History  Problem Relation Age of Onset   Dementia Mother    Stroke Father    Leukemia Sister    Colon cancer Neg Hx      Health Maintenance  Topic Date Due   COVID-19 Vaccine (13 - Moderna risk 2025-26 season) 03/26/2025   Medicare Annual Wellness (AWV)  06/30/2025   DTaP/Tdap/Td (3 - Td or Tdap) 10/06/2032   Pneumococcal Vaccine: 50+ Years  Completed   Influenza Vaccine  Completed   Zoster Vaccines- Shingrix  Completed   Meningococcal B Vaccine  Aged Out   Hepatitis B Vaccines 19-59 Average Risk  Discontinued     Allergies[1]   Outpatient Encounter Medications as of 11/18/2024  Medication Sig   acetaminophen  (TYLENOL ) 500 MG tablet Take 1,000 mg by mouth daily as needed for headache or mild pain (pain score 1-3).   ascorbic acid  (VITAMIN C ) 500 MG tablet Take 500 mg by mouth daily.    Cholecalciferol  (VITAMIN D3 PO) Take 1,000 Units by mouth daily.   dextromethorphan -guaiFENesin  (MUCINEX  DM) 30-600 MG 12hr tablet Take 1 tablet by mouth 2 (two) times daily as needed for cough.   erythromycin  ophthalmic ointment Place 1 Application into both eyes at bedtime.   hydroxypropyl methylcellulose / hypromellose (ISOPTO TEARS / GONIOVISC) 2.5 % ophthalmic solution 1 drop as needed for dry eyes.   ipratropium-albuterol  (DUONEB) 0.5-2.5 (3) MG/3ML SOLN Take 3 mLs by nebulization 2 (two) times daily.   iron  polysaccharides (NIFEREX) 150 MG capsule Take 1 capsule (150 mg total) by mouth daily.   latanoprost  (XALATAN ) 0.005 % ophthalmic solution Place 1 drop into both eyes at bedtime.   Multiple Vitamins-Minerals (PRESERVISION AREDS 2 PO) Take 2 tablets by mouth daily.    neomycin-polymyxin b -dexamethasone  (MAXITROL) 3.5-10000-0.1 SUSP Place 1 drop into the left eye 3 (three) times daily.   pravastatin  (PRAVACHOL ) 20 MG tablet Take 1 tablet (20 mg total) by mouth daily.   sertraline  (ZOLOFT ) 100 MG tablet Take 1 tablet (100 mg total) by mouth daily.   Wound Dressings (MEPILEX) PADS Apply 1 Application topically every 3 (three) days.   No facility-administered encounter medications on file as of 11/18/2024.     Review of Systems  Constitutional: Negative.   HENT: Negative.    Eyes: Negative.   Respiratory:  Positive for cough. Negative for shortness of breath.   Cardiovascular: Negative.   Gastrointestinal: Negative.   Endocrine: Negative.   Genitourinary: Negative.   Musculoskeletal: Negative.   Allergic/Immunologic: Negative.   Neurological:  Positive for weakness.  Hematological: Negative.   Psychiatric/Behavioral: Negative.    All other systems reviewed and are negative.    Vitals:  11/18/24 0858  BP: 128/66  Pulse: 75  Resp: 18  Temp: 97.7 F (36.5 C)  SpO2: 96%  Weight: 192 lb 9.6 oz (87.4 kg)  Height: 5' 7 (1.702 m)   Body mass index is 30.17  kg/m. Physical Exam Vitals and nursing note reviewed.  Constitutional:      General: He is not in acute distress. HENT:     Head: Normocephalic and atraumatic.     Nose: Nose normal.  Eyes:     General: No scleral icterus. Cardiovascular:     Rate and Rhythm: Normal rate and regular rhythm.  Pulmonary:     Effort: Pulmonary effort is normal.     Breath sounds: Rales present.     Comments: Bibasilar rales no wheezing. Abdominal:     General: Bowel sounds are normal. There is no distension.     Palpations: Abdomen is soft.  Musculoskeletal:     Right lower leg: No edema.     Left lower leg: No edema.  Skin:    General: Skin is warm and dry.     Capillary Refill: Capillary refill takes less than 2 seconds.  Neurological:     Mental Status: He is alert and oriented to person, place, and time.      Labs reviewed: Basic Metabolic Panel: Recent Labs    03/16/24 0753 04/09/24 0825 08/10/24 0808 11/12/24 1202 11/13/24 0618 11/13/24 0619 11/16/24 0000  NA 141   < > 142 137  --  137 140  K 4.0   < > 4.1 3.8  --  3.4* 3.8  CL 106   < > 103 99  --  100 103  CO2 30   < > 30 25  --  27 31*  GLUCOSE 106*   < > 105* 105*  --  80  --   BUN 34*   < > 31* 32*  --  29* 19  CREATININE 1.28*   < > 1.15 1.38*  --  1.22 1.1  CALCIUM 9.1  9.2   < > 9.2  9.2 9.7  --  8.7* 8.4*  MG  --   --   --   --  2.1  --   --   PHOS 3.7  --  3.5  --  2.4*  --   --    < > = values in this interval not displayed.   Liver Function Tests: Recent Labs    03/16/24 0753 08/10/24 0808 11/12/24 1202  AST 17 21 59*  ALT 25 36 38  ALKPHOS  --   --  82  BILITOT 0.4 0.5 0.7  PROT 7.5 7.2 8.1  ALBUMIN  --   --  4.2   CBC: Recent Labs    03/16/24 0753 04/09/24 0825 11/12/24 1202 11/13/24 0619 11/16/24 0000  WBC 6.7 6.5 8.2 6.5 5.8  NEUTROABS 3,899 4,238  --   --  3,642.00  HGB 11.9* 12.3* 14.9 13.0 13.3*  HCT 37.7* 38.5 45.8 40.3 39*  MCV 86.9 86.1 96.4 96.9  --   PLT 206 199 136* 110*  125*   Cardiac Enzymes: Recent Labs    03/16/24 0753  CKTOTAL 43    Lab Results  Component Value Date   TSH 2.77 12/30/2023   Lab Results  Component Value Date   VITAMINB12 501 11/13/2024   Lab Results  Component Value Date   FOLATE >20.0 11/13/2024   Lab Results  Component Value Date   IRON  25 (L) 11/13/2024  TIBC 251 11/13/2024   FERRITIN 27 04/09/2024     Imaging and Procedures obtained prior to SNF admission: CT Cervical Spine Wo Contrast Result Date: 11/12/2024 EXAM: CT CERVICAL SPINE WITHOUT CONTRAST 11/12/2024 04:47:59 PM TECHNIQUE: CT of the cervical spine was performed without the administration of intravenous contrast. Multiplanar reformatted images are provided for review. Automated exposure control, iterative reconstruction, and/or weight based adjustment of the mA/kV was utilized to reduce the radiation dose to as low as reasonably achievable. COMPARISON: None available. CLINICAL HISTORY: Neck trauma (Age >= 65y) FINDINGS: CERVICAL SPINE: BONES AND ALIGNMENT: No acute fracture or traumatic malalignment. DEGENERATIVE CHANGES: Mild degenerative changes, most prominent at C5-C6. SOFT TISSUES: No prevertebral soft tissue swelling. IMPRESSION: 1. No acute abnormality of the cervical spine. Electronically signed by: Pinkie Pebbles MD 11/12/2024 05:16 PM EST RP Workstation: [id number]   CT Head Wo Contrast Result Date: 11/12/2024 EXAM: CT HEAD WITHOUT 11/12/2024 04:47:59 PM TECHNIQUE: CT of the head was performed without the administration of intravenous contrast. Automated exposure control, iterative reconstruction, and/or weight based adjustment of the mA/kV was utilized to reduce the radiation dose to as low as reasonably achievable. COMPARISON: 09/08/2024 CLINICAL HISTORY: Head trauma, minor (Age >= 65y) FINDINGS: BRAIN AND VENTRICLES: No acute intracranial hemorrhage. No mass effect or midline shift. No extra-axial fluid collection. No evidence of acute infarct. No  hydrocephalus. Global cortical atrophy. Subcortical and periventricular small vessel ischemic changes. Intracranial atherosclerosis. ORBITS: No acute abnormality. SINUSES AND MASTOIDS: Partial opacification of the right frontal and ethmoid sinuses, chronic. SOFT TISSUES AND SKULL: No acute skull fracture. No acute soft tissue abnormality. IMPRESSION: 1. No acute intracranial abnormality. Electronically signed by: Pinkie Pebbles MD 11/12/2024 05:16 PM EST RP Workstation: [id number]   DG Chest 2 View Result Date: 11/12/2024 EXAM: 2 VIEW(S) XRAY OF THE CHEST 11/12/2024 04:36:00 PM COMPARISON: 03/25/2017 CLINICAL HISTORY: Cough, weakness FINDINGS: LUNGS AND PLEURA: Elevated right hemidiaphragm is noted. Stable minimal right basilar scarring is noted. No pleural effusion. No pneumothorax. HEART AND MEDIASTINUM: No acute abnormality of the cardiac and mediastinal silhouettes. BONES AND SOFT TISSUES: No acute osseous abnormality. IMPRESSION: 1. Stable elevated left hemidiaphragm. 2. Stable minimal right basilar scarring. Electronically signed by: Lynwood Seip MD 11/12/2024 04:59 PM EST RP Workstation: [id number]     Assessment/Plan Debility Continue with efforts with inpatient rehab.  In hopes that he can go home.  He understands that his wife is currently ill with influenza A and he will require continued rehabilitation at [facility] for the next several days.  Influenza A He is recovering from his influenza infection.  He is still coughing somewhat.  No need for steroids at this time  Chronic diastolic CHF (congestive heart failure) (HCC) Stable.  He is not on any guideline directed medical therapy.  Chronic kidney disease, stage 3a (HCC) Baseline creatinine approximately 1.5.  His discharge creatinine was 1.1.  MDD (major depressive disorder), recurrent, in full remission Stable.  He remains on Zoloft  100 mg daily.  Mixed hyperlipidemia Stable.  He remains on Pravachol  20 mg  daily.  Gastroesophageal reflux disease Stable.  DNR (do not resuscitate)/DNI(Do Not Intubate) Verified with the patient and his wife that he is a DNR/DNI.    Family/ staff Communication: spoke with wife Terry   Labs/tests ordered:  Orders Placed This Encounter  Procedures   CBC and differential    This external order was created through the Results Console.   CBC    This external order was created through the Results Console.  Basic metabolic panel with GFR    This external order was created through the Results Console.   Comprehensive metabolic panel with GFR    This external order was created through the Results Console.     Camellia Door, DO  [facility] [phone]      [1]  Allergies Allergen Reactions   Naphazoline-Polyethyl Glycol Other (See Comments)    (Afgan) redness

## 2024-11-18 NOTE — Assessment & Plan Note (Signed)
 Stable

## 2024-11-18 NOTE — Assessment & Plan Note (Signed)
 Verified with the patient and his wife that he is a DNR/DNI.

## 2024-11-18 NOTE — Assessment & Plan Note (Signed)
 Baseline creatinine approximately 1.5.  His discharge creatinine was 1.1.

## 2024-11-18 NOTE — Assessment & Plan Note (Signed)
 Stable.  He remains on Pravachol  20 mg daily.

## 2024-11-18 NOTE — Assessment & Plan Note (Signed)
 Continue with efforts with inpatient rehab.  In hopes that he can go home.  He understands that his wife is currently ill with influenza A and he will require continued rehabilitation at [facility] for the next several days.

## 2024-11-18 NOTE — Assessment & Plan Note (Signed)
 Stable.  He remains on Zoloft  100 mg daily.

## 2024-11-23 ENCOUNTER — Non-Acute Institutional Stay (SKILLED_NURSING_FACILITY): Payer: Self-pay | Admitting: Orthopedic Surgery

## 2024-11-23 ENCOUNTER — Encounter: Payer: Self-pay | Admitting: Orthopedic Surgery

## 2024-11-23 DIAGNOSIS — R2681 Unsteadiness on feet: Secondary | ICD-10-CM | POA: Diagnosis not present

## 2024-11-23 DIAGNOSIS — J101 Influenza due to other identified influenza virus with other respiratory manifestations: Secondary | ICD-10-CM

## 2024-11-23 DIAGNOSIS — N1831 Chronic kidney disease, stage 3a: Secondary | ICD-10-CM

## 2024-11-23 DIAGNOSIS — I5032 Chronic diastolic (congestive) heart failure: Secondary | ICD-10-CM | POA: Diagnosis not present

## 2024-11-23 DIAGNOSIS — F3342 Major depressive disorder, recurrent, in full remission: Secondary | ICD-10-CM

## 2024-11-23 DIAGNOSIS — E782 Mixed hyperlipidemia: Secondary | ICD-10-CM | POA: Diagnosis not present

## 2024-11-23 NOTE — Progress Notes (Signed)
 " Location:  Other [place].  Nursing Home Room Number: [facility] [id number] Place of Service:  SNF (31) Provider:  Greig Cluster, NP  Patient Care Team: Cluster Greig BRAVO, NP as PCP - General (Adult Health Nurse Practitioner)  Extended Emergency Contact Information Primary Emergency Contact: Hawthorn,Brenda Mobile Phone: [phone] Relation: Niece Secondary Emergency Contact: Cartelli,Carole Address: [address] [place], [place] [address] [place] Home Phone: [phone] Mobile Phone: [phone] Relation: Spouse  Code Status:  DNR Goals of care: Advanced Directive information    11/12/2024   10:37 PM  Advanced Directives  Type of Advance Directive Living will  Does patient want to make changes to medical advance directive? No - Patient declined     Chief Complaint  Patient presents with   Discharge Note    Discharge     HPI:  Pt is a 89 y.o. male seen today for discharge evaluation.   He currently resides on the rehab unit at [facility]. PMH: CHF, chronic venous insufficiency, diverticulosis, right eye blindness, prostate cancer, GERD, DDD, CKD, h/o DVT, anxiety and depression.    He had increased weakness with 3 falls prior to hospitalization. In the ED, he was found to he positive for influenza A. CXR negative for infiltrate. Cbc/diff unremarkable. He was started on reduced dose Tamiflu  due to CKD. He did report hitting his head with one of his falls. CT head/cervical spine negative for intracranial abnormality, no acute abnormality of spine. PT/OT recommended SNF.   Today, he plans to go home. He denies chest pain, shortness of breath. He does report clear nasal drainage and occasional dry cough. He is ambulating with walker. No recent falls. PT/OT to continue services at home.    Past Medical History:  Diagnosis Date   Actinic keratosis    Anxiety    BCC (basal cell carcinoma of skin) 07/24/2021   Left upper forehead, EDC   Cancer  (HCC) 2010   Prostate Cancer   Cataract    left eye   Chronic renal disease, stage III (HCC)    Chronic venous insufficiency    Clotting disorder 2013   blood clot 3 days post knee surgery   Diverticulosis of colon    DVT (deep venous thrombosis) (HCC) 2012   after knee replacement   Glaucoma    History of DVT (deep vein thrombosis) 01/22/2017   Formatting of this note might be different from the original.  Peri knee replacement in 2013     HLD (hyperlipidemia)    Hx of basal cell carcinoma 12/23/2018   L nasal tip   Hx of basal cell carcinoma 12/23/2018   R preauricular   Hx of colonic polyp    Macular degeneration    legally blind in right eye   OA (osteoarthritis)    Personal history of prostate cancer    Pulmonary embolism (HCC) 10/2012   post op TKR   PVC (premature ventricular contraction)    Spinal stenosis of lumbar region    Squamous cell carcinoma of skin 05/06/2018   L dorsal forearm near anticubital   Past Surgical History:  Procedure Laterality Date   APPENDECTOMY     CATARACT EXTRACTION W/PHACO Left 06/24/2019   Procedure: CATARACT EXTRACTION PHACO AND INTRAOCULAR LENS PLACEMENT (IOC) LEFT;  Surgeon: Mittie Gaskin, MD;  Location: [facility];  Service: Ophthalmology;  Laterality: Left;   COLONOSCOPY  2011   INGUINAL HERNIA REPAIR  left   INGUINAL HERNIA REPAIR  5/12   Dr Jesslyn   INGUINAL HERNIA REPAIR  5/12   Dr Dellie did redo of this   JOINT REPLACEMENT  11/13   Left total knee--Dr [facility]   KNEE SURGERY  2013   POLYPECTOMY  2011   PROSTATECTOMY  2010   TONSILLECTOMY     VARICOSE VEIN SURGERY     left    Allergies[1]  Outpatient Encounter Medications as of 11/23/2024  Medication Sig   acetaminophen  (TYLENOL ) 500 MG tablet Take 1,000 mg by mouth daily as needed for headache or mild pain (pain score 1-3).   Cholecalciferol  (VITAMIN D3 PO) Take 1,000 Units by mouth daily.   dextromethorphan -guaiFENesin  (MUCINEX  DM) 30-600  MG 12hr tablet Take 1 tablet by mouth 2 (two) times daily as needed for cough.   hydroxypropyl methylcellulose / hypromellose (ISOPTO TEARS / GONIOVISC) 2.5 % ophthalmic solution 1 drop as needed for dry eyes.   iron  polysaccharides (NIFEREX) 150 MG capsule Take 1 capsule (150 mg total) by mouth daily.   latanoprost  (XALATAN ) 0.005 % ophthalmic solution Place 1 drop into both eyes at bedtime.   Multiple Vitamins-Minerals (PRESERVISION AREDS 2 PO) Take 2 tablets by mouth daily.    neomycin-polymyxin b -dexamethasone  (MAXITROL) 3.5-10000-0.1 SUSP Place 1 drop into the left eye 3 (three) times daily.   pravastatin  (PRAVACHOL ) 20 MG tablet Take 1 tablet (20 mg total) by mouth daily.   sertraline  (ZOLOFT ) 100 MG tablet Take 1 tablet (100 mg total) by mouth daily.   Wound Dressings (MEPILEX) PADS Apply 1 Application topically every 3 (three) days.   ascorbic acid  (VITAMIN C ) 500 MG tablet Take 500 mg by mouth daily. (Patient not taking: Reported on 11/23/2024)   erythromycin  ophthalmic ointment Place 1 Application into both eyes at bedtime. (Patient not taking: Reported on 11/23/2024)   ipratropium-albuterol  (DUONEB) 0.5-2.5 (3) MG/3ML SOLN Take 3 mLs by nebulization 2 (two) times daily. (Patient not taking: Reported on 11/23/2024)   No facility-administered encounter medications on file as of 11/23/2024.    Review of Systems  Constitutional:  Negative for appetite change, fatigue and fever.  HENT:  Positive for hearing loss, postnasal drip and rhinorrhea. Negative for sore throat and trouble swallowing.   Eyes:  Negative for visual disturbance.  Respiratory:  Positive for cough. Negative for shortness of breath and wheezing.   Cardiovascular:  Negative for chest pain and leg swelling.  Gastrointestinal:  Negative for abdominal distention and abdominal pain.  Genitourinary:  Negative for dysuria and hematuria.  Musculoskeletal:  Positive for gait problem and joint swelling.  Skin:  Negative for  wound.  Neurological:  Positive for weakness. Negative for dizziness and headaches.  Psychiatric/Behavioral:  Positive for dysphoric mood. Negative for sleep disturbance. The patient is not nervous/anxious.     Immunization History  Administered Date(s) Administered   Hepatitis A, Adult 08/17/2003   Hepatitis B, ADULT 02/15/2004   INFLUENZA, HIGH DOSE SEASONAL PF 09/06/2016, 09/18/2022, 09/25/2024   Influenza Split 09/17/2011, 09/10/2012   Influenza Whole 10/03/2010   Influenza, Seasonal, Injecte, Preservative Fre 10/03/2015   Influenza,inj,Quad PF,6+ Mos 08/13/2017   Influenza-Unspecified 09/02/2014, 09/16/2018, 09/15/2019, 09/02/2020, 09/26/2023   Moderna Covid-19 Fall Seasonal Vaccine 22yrs & older 03/12/2023   Moderna Covid-19 Vaccine  Bivalent Booster 43yrs & up 08/24/2021, 05/01/2022, 10/12/2022   Moderna Sars-Covid-2 Vaccination 12/15/2019, 01/12/2020, 02/19/2020, 10/18/2020, 04/18/2021   OPV 08/17/2003   Pneumococcal Conjugate-13 06/09/2014   Pneumococcal Polysaccharide-23 10/30/2005, 05/06/2018   Td 10/24/2010   Tdap 10/06/2022  Typhoid Live 08/17/2003   Unspecified SARS-COV-2 Vaccination 08/30/2023, 03/13/2024, 09/25/2024   Zoster Recombinant(Shingrix) 07/23/2019, 10/09/2019   Zoster, Live 10/03/2010   Pertinent  Health Maintenance Due  Topic Date Due   Influenza Vaccine  Completed      05/06/2024    2:04 PM 06/30/2024   10:09 AM 07/08/2024    1:28 PM 10/14/2024    1:20 PM 10/14/2024    2:07 PM  Fall Risk  Falls in the past year? 1 1 1 1 1   Was there an injury with Fall? 1  1  1  1  1    Fall Risk Category Calculator 3 3 3 3 3   Patient at Risk for Falls Due to Impaired balance/gait;Impaired mobility Impaired balance/gait;Impaired mobility Impaired balance/gait;Impaired mobility History of fall(s) History of fall(s);Impaired balance/gait  Fall risk Follow up Falls evaluation completed Falls evaluation completed Falls evaluation completed Falls evaluation completed Falls  evaluation completed     Data saved with a previous flowsheet row definition   Functional Status Survey:    Vitals:   11/23/24 1024  BP: 139/78  Pulse: 70  Resp: 18  Temp: (!) 97.4 F (36.3 C)  SpO2: 95%  Weight: 176 lb 6.4 oz (80 kg)  Height: 5' 7 (1.702 m)   Body mass index is 27.63 kg/m. Physical Exam Vitals reviewed.  Constitutional:      General: He is not in acute distress. HENT:     Head: Normocephalic.     Nose: Rhinorrhea present.     Mouth/Throat:     Pharynx: No posterior oropharyngeal erythema.  Eyes:     General:        Right eye: No discharge.        Left eye: No discharge.  Cardiovascular:     Rate and Rhythm: Normal rate and regular rhythm.     Pulses: Normal pulses.     Heart sounds: Normal heart sounds.  Pulmonary:     Effort: Pulmonary effort is normal.     Breath sounds: Normal breath sounds.  Abdominal:     General: Bowel sounds are normal.     Palpations: Abdomen is soft.  Musculoskeletal:     Cervical back: Neck supple.     Right lower leg: No edema.     Left lower leg: No edema.  Skin:    General: Skin is warm.     Capillary Refill: Capillary refill takes less than 2 seconds.  Neurological:     General: No focal deficit present.     Mental Status: He is alert. Mental status is at baseline.     Gait: Gait abnormal.  Psychiatric:        Mood and Affect: Mood normal.     Labs reviewed: Recent Labs    03/16/24 0753 04/09/24 0825 08/10/24 0808 11/12/24 1202 11/13/24 0618 11/13/24 0619 11/16/24 0000  NA 141   < > 142 137  --  137 140  K 4.0   < > 4.1 3.8  --  3.4* 3.8  CL 106   < > 103 99  --  100 103  CO2 30   < > 30 25  --  27 31*  GLUCOSE 106*   < > 105* 105*  --  80  --   BUN 34*   < > 31* 32*  --  29* 19  CREATININE 1.28*   < > 1.15 1.38*  --  1.22 1.1  CALCIUM 9.1  9.2   < > 9.2  9.2 9.7  --  8.7* 8.4*  MG  --   --   --   --  2.1  --   --   PHOS 3.7  --  3.5  --  2.4*  --   --    < > = values in this interval  not displayed.   Recent Labs    03/16/24 0753 08/10/24 0808 11/12/24 1202  AST 17 21 59*  ALT 25 36 38  ALKPHOS  --   --  82  BILITOT 0.4 0.5 0.7  PROT 7.5 7.2 8.1  ALBUMIN  --   --  4.2   Recent Labs    03/16/24 0753 04/09/24 0825 11/12/24 1202 11/13/24 0619 11/16/24 0000  WBC 6.7 6.5 8.2 6.5 5.8  NEUTROABS 3,899 4,238  --   --  3,642.00  HGB 11.9* 12.3* 14.9 13.0 13.3*  HCT 37.7* 38.5 45.8 40.3 39*  MCV 86.9 86.1 96.4 96.9  --   PLT 206 199 136* 110* 125*   Lab Results  Component Value Date   TSH 2.77 12/30/2023   No results found for: HGBA1C Lab Results  Component Value Date   CHOL 187 12/30/2023   HDL 52 12/30/2023   LDLCALC 112 (H) 12/30/2023   LDLDIRECT 113.0 05/29/2022   TRIG 122 12/30/2023   CHOLHDL 3.6 12/30/2023    Significant Diagnostic Results in last 30 days:  CT Cervical Spine Wo Contrast Result Date: 11/12/2024 EXAM: CT CERVICAL SPINE WITHOUT CONTRAST 11/12/2024 04:47:59 PM TECHNIQUE: CT of the cervical spine was performed without the administration of intravenous contrast. Multiplanar reformatted images are provided for review. Automated exposure control, iterative reconstruction, and/or weight based adjustment of the mA/kV was utilized to reduce the radiation dose to as low as reasonably achievable. COMPARISON: None available. CLINICAL HISTORY: Neck trauma (Age >= 65y) FINDINGS: CERVICAL SPINE: BONES AND ALIGNMENT: No acute fracture or traumatic malalignment. DEGENERATIVE CHANGES: Mild degenerative changes, most prominent at C5-C6. SOFT TISSUES: No prevertebral soft tissue swelling. IMPRESSION: 1. No acute abnormality of the cervical spine. Electronically signed by: Pinkie Pebbles MD 11/12/2024 05:16 PM EST RP Workstation: [id number]   CT Head Wo Contrast Result Date: 11/12/2024 EXAM: CT HEAD WITHOUT 11/12/2024 04:47:59 PM TECHNIQUE: CT of the head was performed without the administration of intravenous contrast. Automated exposure control,  iterative reconstruction, and/or weight based adjustment of the mA/kV was utilized to reduce the radiation dose to as low as reasonably achievable. COMPARISON: 09/08/2024 CLINICAL HISTORY: Head trauma, minor (Age >= 65y) FINDINGS: BRAIN AND VENTRICLES: No acute intracranial hemorrhage. No mass effect or midline shift. No extra-axial fluid collection. No evidence of acute infarct. No hydrocephalus. Global cortical atrophy. Subcortical and periventricular small vessel ischemic changes. Intracranial atherosclerosis. ORBITS: No acute abnormality. SINUSES AND MASTOIDS: Partial opacification of the right frontal and ethmoid sinuses, chronic. SOFT TISSUES AND SKULL: No acute skull fracture. No acute soft tissue abnormality. IMPRESSION: 1. No acute intracranial abnormality. Electronically signed by: Pinkie Pebbles MD 11/12/2024 05:16 PM EST RP Workstation: [id number]   DG Chest 2 View Result Date: 11/12/2024 EXAM: 2 VIEW(S) XRAY OF THE CHEST 11/12/2024 04:36:00 PM COMPARISON: 03/25/2017 CLINICAL HISTORY: Cough, weakness FINDINGS: LUNGS AND PLEURA: Elevated right hemidiaphragm is noted. Stable minimal right basilar scarring is noted. No pleural effusion. No pneumothorax. HEART AND MEDIASTINUM: No acute abnormality of the cardiac and mediastinal silhouettes. BONES AND SOFT TISSUES: No acute osseous abnormality. IMPRESSION: 1. Stable elevated left hemidiaphragm. 2. Stable minimal right basilar scarring. Electronically signed by: Lynwood Seip MD 11/12/2024  04:59 PM EST RP Workstation: [id number]    Assessment/Plan 1. Influenza A (Primary) - 12/11 tested positive - completed Tamiflu , duonebs and guaifenesin  - lung sounds clear  - PND/ dry cough  - advised to start Claritin x 7 days to see if symptoms improve   2. Unstable gait - frequent falls with head injury last hospitalization  - cont PT/OT at home  3. Chronic diastolic CHF (congestive heart failure) (HCC) - compensated - not on medication  4. MDD  (major depressive disorder), recurrent, in full remission - stable mood - Na+ 140  - conr Zoloft    5. Chronic kidney disease, stage 3a (HCC) - GFR 64 (12/15)> was 57 (12/12) - encourage hydration with water - avoid NSAIDS  6. Mixed hyperlipidemia - cont pravastatin    Family/ staff Communication: plan discussed with patient and nurse  Labs/tests ordered:  none   F/u with PCP in 1-2 weeks.         [1]  Allergies Allergen Reactions   Naphazoline-Polyethyl Glycol Other (See Comments)    (Afgan) redness   "

## 2024-11-30 ENCOUNTER — Other Ambulatory Visit: Payer: Self-pay | Admitting: *Deleted

## 2024-11-30 NOTE — Patient Outreach (Signed)
" ° °  11/30/2024  Mostafa Yuan [birth year] [id number]   Mr. Ranieri previously utilized [facility] SNF waiver for admission to [facility]). Verified in [facility], Mr. Antonellis discharged from [facility] on 11/23/24.   Secure message sent to [facility] Admission Coordinator to confirm transition details.   Will await response.   Pablo Hurst, MSN, RN, BSN [place]  [facility], [facility] RN Care Manager Direct Dial: [phone]                "

## 2024-12-04 ENCOUNTER — Other Ambulatory Visit: Payer: Self-pay | Admitting: *Deleted

## 2024-12-04 NOTE — Patient Outreach (Signed)
" °  12/04/2024  [place] Dani 10/05/1933 [id number]   [facility] SNF waiver previously utilized for admission to [facility]. Update received from Alfonso Lavella Eric Admission Coordinator, Mr. Rathje discharged from [facility] on 11/23/24.   Mr. Brindle returned to [facility] ILF with outpatient therapy.   No identifiable complex care management needs.   Pablo Hurst, MSN, RN, BSN [place]  [facility], [facility] RN Care Manager Direct Dial: [phone]                "

## 2025-04-16 ENCOUNTER — Encounter: Admitting: Orthopedic Surgery

## 2025-07-01 ENCOUNTER — Ambulatory Visit: Payer: Self-pay | Admitting: Nurse Practitioner
